# Patient Record
Sex: Male | Born: 1970 | Race: White | Hispanic: No | Marital: Single | Smoking: Former smoker
Health system: Southern US, Community
[De-identification: ages and names within clinical notes are randomized; demographics above are authoritative.]

## PROBLEM LIST (undated history)

## (undated) DIAGNOSIS — E785 Hyperlipidemia, unspecified: Secondary | ICD-10-CM

## (undated) DIAGNOSIS — J45909 Unspecified asthma, uncomplicated: Secondary | ICD-10-CM

## (undated) DIAGNOSIS — E782 Mixed hyperlipidemia: Secondary | ICD-10-CM

## (undated) DIAGNOSIS — G4730 Sleep apnea, unspecified: Secondary | ICD-10-CM

## (undated) DIAGNOSIS — K219 Gastro-esophageal reflux disease without esophagitis: Secondary | ICD-10-CM

## (undated) DIAGNOSIS — E119 Type 2 diabetes mellitus without complications: Secondary | ICD-10-CM

## (undated) DIAGNOSIS — I10 Essential (primary) hypertension: Secondary | ICD-10-CM

## (undated) DIAGNOSIS — G629 Polyneuropathy, unspecified: Secondary | ICD-10-CM

## (undated) DIAGNOSIS — M1990 Unspecified osteoarthritis, unspecified site: Secondary | ICD-10-CM

## (undated) DIAGNOSIS — R7303 Prediabetes: Secondary | ICD-10-CM

## (undated) DIAGNOSIS — Z8719 Personal history of other diseases of the digestive system: Secondary | ICD-10-CM

## (undated) HISTORY — DX: Mixed hyperlipidemia: E78.2

## (undated) HISTORY — DX: Prediabetes: R73.03

## (undated) HISTORY — PX: CATARACT EXTRACTION: SUR2

---

## 2000-05-15 ENCOUNTER — Encounter: Payer: Self-pay | Admitting: Emergency Medicine

## 2000-05-15 ENCOUNTER — Emergency Department (HOSPITAL_COMMUNITY): Admission: EM | Admit: 2000-05-15 | Discharge: 2000-05-15 | Payer: Self-pay | Admitting: Emergency Medicine

## 2011-03-30 ENCOUNTER — Emergency Department (HOSPITAL_COMMUNITY)
Admission: EM | Admit: 2011-03-30 | Discharge: 2011-03-30 | Disposition: A | Discharge status: Home / Self Care | Payer: Self-pay | Attending: Emergency Medicine | Admitting: Emergency Medicine

## 2011-03-30 ENCOUNTER — Encounter (HOSPITAL_COMMUNITY): Payer: Self-pay | Admitting: *Deleted

## 2011-03-30 ENCOUNTER — Other Ambulatory Visit: Payer: Self-pay

## 2011-03-30 DIAGNOSIS — R5381 Other malaise: Secondary | ICD-10-CM | POA: Insufficient documentation

## 2011-03-30 DIAGNOSIS — R112 Nausea with vomiting, unspecified: Secondary | ICD-10-CM | POA: Insufficient documentation

## 2011-03-30 DIAGNOSIS — IMO0001 Reserved for inherently not codable concepts without codable children: Secondary | ICD-10-CM | POA: Insufficient documentation

## 2011-03-30 DIAGNOSIS — E785 Hyperlipidemia, unspecified: Secondary | ICD-10-CM | POA: Insufficient documentation

## 2011-03-30 DIAGNOSIS — R61 Generalized hyperhidrosis: Secondary | ICD-10-CM | POA: Insufficient documentation

## 2011-03-30 DIAGNOSIS — I10 Essential (primary) hypertension: Secondary | ICD-10-CM | POA: Insufficient documentation

## 2011-03-30 DIAGNOSIS — R197 Diarrhea, unspecified: Secondary | ICD-10-CM | POA: Insufficient documentation

## 2011-03-30 DIAGNOSIS — R42 Dizziness and giddiness: Secondary | ICD-10-CM | POA: Insufficient documentation

## 2011-03-30 HISTORY — DX: Essential (primary) hypertension: I10

## 2011-03-30 HISTORY — DX: Hyperlipidemia, unspecified: E78.5

## 2011-03-30 LAB — URINALYSIS, ROUTINE W REFLEX MICROSCOPIC
Bilirubin Urine: NEGATIVE
Hgb urine dipstick: NEGATIVE
Nitrite: NEGATIVE
Urobilinogen, UA: 0.2 mg/dL (ref 0.0–1.0)

## 2011-03-30 LAB — BASIC METABOLIC PANEL
BUN: 22 mg/dL (ref 6–23)
CO2: 27 mEq/L (ref 19–32)
Calcium: 9.8 mg/dL (ref 8.4–10.5)
Chloride: 98 mEq/L (ref 96–112)
Creatinine, Ser: 1.01 mg/dL (ref 0.50–1.35)
GFR calc Af Amer: >90 mL/min (ref >90)
GFR calc non Af Amer: >90 mL/min (ref >90)
Glucose, Bld: 160 mg/dL — ABNORMAL HIGH (ref 70–99)
Potassium: 3.5 mEq/L (ref 3.5–5.1)
Sodium: 135 mEq/L (ref 135–145)

## 2011-03-30 LAB — DIFFERENTIAL
Basophils Absolute: 0 10*3/uL (ref 0.0–0.1)
Basophils Relative: 0 % (ref 0–1)
Eosinophils Absolute: 0.2 10*3/uL (ref 0.0–0.7)
Monocytes Absolute: 0.5 10*3/uL (ref 0.1–1.0)
Monocytes Relative: 6 % (ref 3–12)

## 2011-03-30 LAB — CBC
HCT: 48 % (ref 39.0–52.0)
Hemoglobin: 16.1 g/dL (ref 13.0–17.0)
MCH: 28.6 pg (ref 26.0–34.0)
MCHC: 33.5 g/dL (ref 30.0–36.0)
MCV: 85.4 fL (ref 78.0–100.0)
Platelets: 220 10*3/uL (ref 150–400)
RBC: 5.62 MIL/uL (ref 4.22–5.81)
RDW: 14 % (ref 11.5–15.5)
WBC: 8.7 10*3/uL (ref 4.0–10.5)

## 2011-03-30 MED ORDER — KETOROLAC TROMETHAMINE 30 MG/ML IJ SOLN
30.0000 mg | Freq: Once | INTRAMUSCULAR | Status: AC
  Administered 2011-03-30: 30 mg via INTRAVENOUS
  Filled 2011-03-30: qty 1

## 2011-03-30 MED ORDER — ONDANSETRON HCL 4 MG/2ML IJ SOLN
4.0000 mg | Freq: Once | INTRAMUSCULAR | Status: AC
  Administered 2011-03-30: 4 mg via INTRAVENOUS
  Filled 2011-03-30: qty 2

## 2011-03-30 MED ORDER — SODIUM CHLORIDE 0.9 % IV BOLUS (SEPSIS)
1000.0000 mL | Freq: Once | INTRAVENOUS | Status: AC
  Administered 2011-03-30: 1000 mL via INTRAVENOUS

## 2011-03-30 MED ORDER — PROMETHAZINE HCL 25 MG PO TABS: 25.0000 mg | ORAL_TABLET | Freq: Four times a day (QID) | ORAL | Status: AC | PRN

## 2011-03-30 NOTE — ED Notes (Signed)
Pt states he woke this am with dizziness sweating and nausea. Pt denies abd pain and states no more sob than normal.

## 2011-03-30 NOTE — ED Provider Notes (Signed)
History     CSN: [id number]  Arrival date & time 03/30/11  1016   First MD Initiated Contact with Patient 03/30/11 1049      Chief Complaint  Patient presents with  . Emesis  . Dizziness  . Excessive Sweating    (Consider location/radiation/quality/duration/timing/severity/associated sxs/prior treatment) HPI Comments: Patient c/o sudden onset of nausea, vomiting, generalized weakness this morning.  Reports 4-5 episodes of vomiting stomach contents and one episode of loose brown stool.  Also reports having dizziness and sweating after the vomiting began. No hematemesis, melena or hematochezia.  He also denies, fever, chest pain, dyspnea, headache, abdominal pain or focal weakness  Patient is a 41 y.o. male presenting with vomiting. The history is provided by the patient. No language interpreter was used.  Emesis  This is a new problem. The current episode started 3 to 5 hours ago. The problem occurs 2 to 4 times per day. The problem has been gradually improving. The emesis has an appearance of stomach contents. There has been no fever. Associated symptoms include diarrhea, myalgias and sweats. Pertinent negatives include no abdominal pain, no arthralgias, no chills, no cough, no fever, no headaches and no URI.    Past Medical History  Diagnosis Date  . Hypertension   . Hyperlipidemia     Past Surgical History  Procedure Date  . Cataract extraction     History reviewed. No pertinent family history.  History  Substance Use Topics  . Smoking status: Former Games developer  . Smokeless tobacco: Not on file  . Alcohol Use: No      Review of Systems  Constitutional: Negative for fever and chills.  Eyes: Negative for visual disturbance.  Respiratory: Negative for cough.   Cardiovascular: Negative for chest pain, palpitations and leg swelling.  Gastrointestinal: Positive for nausea, vomiting and diarrhea. Negative for abdominal pain and anal bleeding.  Genitourinary: Negative for  dysuria, hematuria and flank pain.  Musculoskeletal: Positive for myalgias. Negative for arthralgias.  Skin: Negative.   Neurological: Positive for dizziness and weakness. Negative for speech difficulty, numbness and headaches.  Hematological: Negative for adenopathy.  Psychiatric/Behavioral: Negative for confusion and decreased concentration.    Allergies  Review of patient's allergies indicates no known allergies.  Home Medications  No current outpatient prescriptions on file.  BP 170/95  Pulse 83  Temp(Src) 98 F (36.7 C) (Oral)  Resp 18  Ht 5\' 5"  (1.651 m)  Wt 320 lb (145.151 kg)  BMI 53.25 kg/m2  SpO2 96%  Physical Exam  Nursing note and vitals reviewed. Constitutional: He is oriented to person, place, and time. He appears well-developed and well-nourished. No distress.  HENT:  Head: Normocephalic and atraumatic.  Mouth/Throat: Oropharynx is clear and moist.  Neck: Normal range of motion. Neck supple.  Cardiovascular: Normal rate, regular rhythm, normal heart sounds and intact distal pulses.   No murmur heard. Pulmonary/Chest: Effort normal and breath sounds normal. No respiratory distress. He has no wheezes. He has no rales.  Abdominal: Soft. Bowel sounds are normal. He exhibits no distension and no mass. There is no tenderness. There is no rebound and no guarding.  Musculoskeletal: Normal range of motion. He exhibits no edema and no tenderness.  Lymphadenopathy:    He has no cervical adenopathy.  Neurological: He is alert and oriented to person, place, and time. No cranial nerve deficit. He exhibits normal muscle tone. Coordination normal.  Skin: Skin is warm and dry.  Psychiatric: He has a normal mood and affect.  ED Course  Procedures (including critical care time)   Results for orders placed during the hospital encounter of 03/30/11  CBC      Component Value Range   WBC 8.7  4.0 - 10.5 (K/uL)   RBC 5.62  4.22 - 5.81 (MIL/uL)   Hemoglobin 16.1  13.0 -  17.0 (g/dL)   HCT 16.1  09.6 - 04.5 (%)   MCV 85.4  78.0 - 100.0 (fL)   MCH 28.6  26.0 - 34.0 (pg)   MCHC 33.5  30.0 - 36.0 (g/dL)   RDW 40.9  81.1 - 91.4 (%)   Platelets 220  150 - 400 (K/uL)  BASIC METABOLIC PANEL      Component Value Range   Sodium 135  135 - 145 (mEq/L)   Potassium 3.5  3.5 - 5.1 (mEq/L)   Chloride 98  96 - 112 (mEq/L)   CO2 27  19 - 32 (mEq/L)   Glucose, Bld 160 (*) 70 - 99 (mg/dL)   BUN 22  6 - 23 (mg/dL)   Creatinine, Ser 7.82  0.50 - 1.35 (mg/dL)   Calcium 9.8  8.4 - 95.6 (mg/dL)   GFR calc non Af Amer >90  >90 (mL/min)   GFR calc Af Amer >90  >90 (mL/min)  URINALYSIS, ROUTINE W REFLEX MICROSCOPIC      Component Value Range   Color, Urine YELLOW  YELLOW    APPearance CLEAR  CLEAR    Specific Gravity, Urine >1.030 (*) 1.005 - 1.030    pH 6.0  5.0 - 8.0    Glucose, UA NEGATIVE  NEGATIVE (mg/dL)   Hgb urine dipstick NEGATIVE  NEGATIVE    Bilirubin Urine NEGATIVE  NEGATIVE    Ketones, ur NEGATIVE  NEGATIVE (mg/dL)   Protein, ur TRACE (*) NEGATIVE (mg/dL)   Urobilinogen, UA 0.2  0.0 - 1.0 (mg/dL)   Nitrite NEGATIVE  NEGATIVE    Leukocytes, UA NEGATIVE  NEGATIVE   DIFFERENTIAL      Component Value Range   Neutrophils Relative 77  43 - 77 (%)   Neutro Abs 6.7  1.7 - 7.7 (K/uL)   Lymphocytes Relative 14  12 - 46 (%)   Lymphs Abs 1.2  0.7 - 4.0 (K/uL)   Monocytes Relative 6  3 - 12 (%)   Monocytes Absolute 0.5  0.1 - 1.0 (K/uL)   Eosinophils Relative 2  0 - 5 (%)   Eosinophils Absolute 0.2  0.0 - 0.7 (K/uL)   Basophils Relative 0  0 - 1 (%)   Basophils Absolute 0.0  0.0 - 0.1 (K/uL)  URINE MICROSCOPIC-ADD ON      Component Value Range   WBC, UA 0-2  <3 (WBC/hpf)   RBC / HPF 0-2  <3 (RBC/hpf)        MDM     Date: 03/30/2011  Rate: 81  Rhythm: normal sinus rhythm  QRS Axis: normal  Intervals: normal  ST/T Wave abnormalities: normal  Conduction Disutrbances:none  Narrative Interpretation: normal appearing EKG  Old EKG Reviewed: none  available   EKG reviewed by Dr. Colon Branch   Patient has received IV fluids, Zofran, and Toradol IV. Symptoms have resolved he states he is feeling much better and requesting to go home.  Abdomen remains soft and nontender no guarding or peritoneal signs.  Symptoms are likely related to gastroenteritis. He has no focal neuro deficits on his exam, no meningeal signs, no chest pain, dyspnea, or headache. I doubt cardiac or neurological cause.  I have discussed patient's history results and care plan with the EDP. Patient also seen by EDP prior to discharge. He agrees to close followup with his primary care physician or to return here symptoms  worsen.   Patient / Family / Caregiver understand and agree with initial ED impression and plan with expectations set for ED visit. Pt stable in ED with no significant deterioration in condition. Pt feels improved after observation and/or treatment in ED.    Jerrell Mangel L. [place], [place] 03/30/11 2131

## 2011-03-30 NOTE — ED Notes (Signed)
Patient states he is feeling better

## 2011-03-30 NOTE — Discharge Instructions (Signed)
B.R.A.T. Diet  Your doctor has recommended the B.R.A.T. diet for you or your child until the condition improves. This is often used to help control diarrhea and vomiting symptoms. If you or your child can tolerate clear liquids, you may have:   Bananas.    Rice.    Applesauce.    Toast (and other simple starches such as crackers, potatoes, noodles).   Be sure to avoid dairy products, meats, and fatty foods until symptoms are better. Fruit juices such as apple, grape, and prune juice can make diarrhea worse. Avoid these. Continue this diet for 2 days or as instructed by your caregiver.  Document Released: 12/27/2004 Document Revised: 12/16/2010 Document Reviewed: 06/15/2006  ExitCare Patient Information 2012 [employer].    Clear Liquid Diet  The clear liquid diet consists of foods that are liquid or will become liquid at room temperature. You should be able to see through the liquid and beverages. Examples of foods allowed on a clear liquid diet include fruit juice, broth or bouillon, gelatin, or frozen ice pops.  The purpose of this diet is to provide necessary fluid, electrolytes such as sodium and potassium, and energy to keep the body functioning during times when you are not able to consume a regular diet. A clear liquid diet should not be continued for long periods of time as it is not nutritionally adequate.    REASONS FOR USING A CLEAR LIQUID DIET   In sudden onset (acute) conditions for a patient before or after surgery.    As the first step in oral feeding.    For fluid and electrolyte replacement in diarrheal diseases.    As a diet before certain medical tests are performed.   ADEQUACY  The clear liquid diet is adequate only in ascorbic acid, according to the Recommended Dietary Allowances of the National Research Council.  CHOOSING FOODS  Breads and Starches   Allowed:  None are allowed.    Avoid: All are avoided.   Vegetables   Allowed:  Strained tomato or vegetable juice.     Avoid: Any others.   Fruit   Allowed:  Strained fruit juices and fruit drinks. Include 1 serving of citrus or vitamin C-enriched fruit juice daily.    Avoid: Any others.   Meat and Meat Substitutes   Allowed:  None are allowed.    Avoid: All are avoided.   Milk   Allowed:  None are allowed.    Avoid: All are avoided.   Soups and Combination Foods   Allowed:  Clear bouillon, broth, or strained broth-based soups.    Avoid: Any others.   Desserts and Sweets   Allowed:  Sugar, honey. High protein gelatin. Flavored gelatin, ices, or frozen ice pops that do not contain milk.    Avoid: Any others.   Fats and Oils   Allowed:  None are allowed.    Avoid: All are avoided.   Beverages   Allowed: Cereal beverages, coffee (regular or decaffeinated), tea, or soda at the discretion of your caregiver.    Avoid: Any others.   Condiments   Allowed:  Iodized salt.    Avoid: Any others, including pepper.   Supplements   Allowed:  Liquid nutrition beverages.    Avoid: Any others that contain lactose or fiber.   SAMPLE MEAL PLAN  Breakfast   4 oz (120 mL) strained orange juice.     to 1 cup (125 to 250 mL) gelatin (plain or fortified).    1 cup (  250 mL) beverage (coffee or tea).    Sugar, if desired.   Midmorning Snack    cup (125 mL) gelatin (plain or fortified).   Lunch   1 cup (250 mL) broth or consomm.    4 oz (120 mL) strained grapefruit juice.     cup (125 mL) gelatin (plain or fortified).    1 cup (250 mL) beverage (coffee or tea).    Sugar, if desired.   Midafternoon Snack    cup (125 mL) fruit ice.     cup (125 mL) strained fruit juice.   Dinner   1 cup (250 mL) broth or consomm.     cup (125 mL) cranberry juice.     cup (125 mL) flavored gelatin (plain or fortified).    1 cup (250 mL) beverage (coffee or tea).    Sugar, if desired.   Evening Snack   4 oz (120 mL) strained apple juice (vitamin C-fortified).     cup (125 mL) flavored gelatin (plain or fortified).    Document Released: 12/27/2004 Document Revised: 12/16/2010 Document Reviewed: 03/26/2010  ExitCare Patient Information 2012 [employer].    Nausea and Vomiting  Nausea is a sick feeling that often comes before throwing up (vomiting). Vomiting is a reflex where stomach contents come out of your mouth. Vomiting can cause severe loss of body fluids (dehydration). Children and elderly adults can become dehydrated quickly, especially if they also have diarrhea. Nausea and vomiting are symptoms of a condition or disease. It is important to find the cause of your symptoms.  CAUSES     Direct irritation of the stomach lining. This irritation can result from increased acid production (gastroesophageal reflux disease), infection, food poisoning, taking certain medicines (such as nonsteroidal anti-inflammatory drugs), alcohol use, or tobacco use.    Signals from the brain. These signals could be caused by a headache, heat exposure, an inner ear disturbance, increased pressure in the brain from injury, infection, a tumor, or a concussion, pain, emotional stimulus, or metabolic problems.    An obstruction in the gastrointestinal tract (bowel obstruction).    Illnesses such as diabetes, hepatitis, gallbladder problems, appendicitis, kidney problems, cancer, sepsis, atypical symptoms of a heart attack, or eating disorders.    Medical treatments such as chemotherapy and radiation.    Receiving medicine that makes you sleep (general anesthetic) during surgery.   DIAGNOSIS  Your caregiver may ask for tests to be done if the problems do not improve after a few days. Tests may also be done if symptoms are severe or if the reason for the nausea and vomiting is not clear. Tests may include:   Urine tests.    Blood tests.    Stool tests.    Cultures (to look for evidence of infection).    X-rays or other imaging studies.   Test results can help your caregiver make decisions about treatment or the need for additional tests.   TREATMENT  You need to stay well hydrated. Drink frequently but in small amounts. You may wish to drink water, sports drinks, clear broth, or eat frozen ice pops or gelatin dessert to help stay hydrated. When you eat, eating slowly may help prevent nausea. There are also some antinausea medicines that may help prevent nausea.  HOME CARE INSTRUCTIONS     Take all medicine as directed by your caregiver.    If you do not have an appetite, do not force yourself to eat. However, you must continue to drink fluids.      If you have an appetite, eat a normal diet unless your caregiver tells you differently.    Eat a variety of complex carbohydrates (rice, wheat, potatoes, bread), lean meats, yogurt, fruits, and vegetables.    Avoid high-fat foods because they are more difficult to digest.    Drink enough water and fluids to keep your urine clear or pale yellow.    If you are dehydrated, ask your caregiver for specific rehydration instructions. Signs of dehydration may include:    Severe thirst.    Dry lips and mouth.    Dizziness.    Dark urine.    Decreasing urine frequency and amount.    Confusion.    Rapid breathing or pulse.   SEEK IMMEDIATE MEDICAL CARE IF:     You have blood or brown flecks (like coffee grounds) in your vomit.    You have black or bloody stools.    You have a severe headache or stiff neck.    You are confused.    You have severe abdominal pain.    You have chest pain or trouble breathing.    You do not urinate at least once every 8 hours.    You develop cold or clammy skin.    You continue to vomit for longer than 24 to 48 hours.    You have a fever.   MAKE SURE YOU:     Understand these instructions.    Will watch your condition.    Will get help right away if you are not doing well or get worse.   Document Released: 12/27/2004 Document Revised: 12/16/2010 Document Reviewed: 05/26/2010  [employer] Patient Information 2012 [employer].

## 2011-03-31 NOTE — ED Provider Notes (Signed)
Medical screening examination/treatment/procedure(s) were conducted as a shared visit with non-physician practitioner(s) and myself.  I personally evaluated the patient during the encounter  John Cook. Colon Branch, MD 03/31/11 [phone]

## 2011-03-31 NOTE — Progress Notes (Signed)
Patient who presents with nausea, vomiting, diarrhea and abdominal pain. Given IVF, antiemetic, analgesic with relief.Able to take PO fluids.  Cor: RRR Chest: clear all fields to auscultation.  Abd: soft, non tender, hyperactive bowel sounds. Patient has improved in the ER. Discharge home.

## 2014-10-22 DIAGNOSIS — L089 Local infection of the skin and subcutaneous tissue, unspecified: Secondary | ICD-10-CM

## 2014-10-22 DIAGNOSIS — R7303 Prediabetes: Secondary | ICD-10-CM

## 2014-10-22 DIAGNOSIS — I10 Essential (primary) hypertension: Secondary | ICD-10-CM

## 2014-10-22 DIAGNOSIS — E7800 Pure hypercholesterolemia, unspecified: Secondary | ICD-10-CM

## 2014-10-22 NOTE — Congregational Nurse Program (Unsigned)
Client presentd with complaint of sores on left lower abdomen, easily become irritated and bleed or drain. Problem over the past 11/2 yrs. Intermittently. States he has borderline diabetes, high blood pressure (on medication). Has hx of elevated cholesterol, indigestion, treats with Prilosec, concerned that he may have Sleep Apnea. Last physical over 12 yrs ago. Denied Medicaid, applied for Disability. Hasn't worked in 11 yrs. Appt. Scheduled at [facility]. On October 25,2016 at 11am.

## 2015-01-09 DIAGNOSIS — Z139 Encounter for screening, unspecified: Secondary | ICD-10-CM

## 2015-01-09 NOTE — Congregational Nurse Program (Unsigned)
Congregational Nurse Program Note  Date of Encounter: 11/04/14 Past Medical History: Past Medical History  Diagnosis Date   Hypertension    Hyperlipidemia     Encounter Details:    [facility]. Instructions given to return in 6 months or PRN

## 2015-03-06 ENCOUNTER — Emergency Department (HOSPITAL_COMMUNITY)
Admission: EM | Admit: 2015-03-06 | Discharge: 2015-03-06 | Disposition: A | Discharge status: Home / Self Care | Payer: Self-pay | Attending: Emergency Medicine | Admitting: Emergency Medicine

## 2015-03-06 ENCOUNTER — Encounter (HOSPITAL_COMMUNITY): Payer: Self-pay | Admitting: Emergency Medicine

## 2015-03-06 ENCOUNTER — Emergency Department (HOSPITAL_COMMUNITY): Payer: Self-pay

## 2015-03-06 DIAGNOSIS — J069 Acute upper respiratory infection, unspecified: Secondary | ICD-10-CM

## 2015-03-06 DIAGNOSIS — R51 Headache: Secondary | ICD-10-CM | POA: Insufficient documentation

## 2015-03-06 DIAGNOSIS — I10 Essential (primary) hypertension: Secondary | ICD-10-CM | POA: Insufficient documentation

## 2015-03-06 DIAGNOSIS — Z79899 Other long term (current) drug therapy: Secondary | ICD-10-CM | POA: Insufficient documentation

## 2015-03-06 DIAGNOSIS — E785 Hyperlipidemia, unspecified: Secondary | ICD-10-CM | POA: Insufficient documentation

## 2015-03-06 DIAGNOSIS — Z87891 Personal history of nicotine dependence: Secondary | ICD-10-CM | POA: Insufficient documentation

## 2015-03-06 DIAGNOSIS — J40 Bronchitis, not specified as acute or chronic: Secondary | ICD-10-CM

## 2015-03-06 MED ORDER — ALBUTEROL SULFATE (2.5 MG/3ML) 0.083% IN NEBU
2.5000 mg | INHALATION_SOLUTION | Freq: Once | RESPIRATORY_TRACT | Status: AC
  Administered 2015-03-06: 2.5 mg via RESPIRATORY_TRACT
  Filled 2015-03-06: qty 3

## 2015-03-06 MED ORDER — DEXAMETHASONE 4 MG PO TABS: 4.0000 mg | ORAL_TABLET | Freq: Two times a day (BID) | ORAL | Status: DC

## 2015-03-06 MED ORDER — PROMETHAZINE-CODEINE 6.25-10 MG/5ML PO SYRP: 5.0000 mL | ORAL_SOLUTION | Freq: Four times a day (QID) | ORAL | Status: DC | PRN

## 2015-03-06 MED ORDER — OXYMETAZOLINE HCL 0.05 % NA SOLN
2.0000 | Freq: Once | NASAL | Status: AC
  Administered 2015-03-06: 2 via NASAL
  Filled 2015-03-06: qty 15

## 2015-03-06 MED ORDER — DEXAMETHASONE SODIUM PHOSPHATE 4 MG/ML IJ SOLN
8.0000 mg | Freq: Once | INTRAMUSCULAR | Status: AC
  Administered 2015-03-06: 8 mg via INTRAMUSCULAR
  Filled 2015-03-06: qty 2

## 2015-03-06 NOTE — ED Notes (Signed)
Started with cough on Feb 15 th and treated with mucinex OTC at home.  Cough (Yellowish green).  Lower grade temp and chest congestion.

## 2015-03-06 NOTE — ED Provider Notes (Signed)
CSN: [id number]     Arrival date & time 03/06/15  1231 History   First MD Initiated Contact with Patient 03/06/15 1504     Chief Complaint  Patient presents with  . Cough     (Consider location/radiation/quality/duration/timing/severity/associated sxs/prior Treatment) Patient is a 45 y.o. male presenting with cough. The history is provided by the patient.  Cough Cough characteristics:  Productive Sputum characteristics:  Green Severity:  Moderate Onset quality:  Gradual Duration:  9 days Timing:  Intermittent Progression:  Worsening Chronicity:  New Smoker: no   Context: sick contacts and weather changes   Relieved by:  Nothing Ineffective treatments:  Decongestant and cough suppressants Associated symptoms: chills, fever, headaches and sinus congestion   Associated symptoms: no rash   Risk factors: no recent travel     Past Medical History  Diagnosis Date  . Hypertension   . Hyperlipidemia    Past Surgical History  Procedure Laterality Date  . Cataract extraction     History reviewed. No pertinent family history. Social History  Substance Use Topics  . Smoking status: Former Games developer  . Smokeless tobacco: None  . Alcohol Use: No    Review of Systems  Constitutional: Positive for fever and chills.  Respiratory: Positive for cough.   Skin: Negative for rash.  Neurological: Positive for headaches.  All other systems reviewed and are negative.     Allergies  Lisinopril  Home Medications   Prior to Admission medications   Medication Sig Start Date End Date Taking? Authorizing Provider  aspirin EC 325 MG tablet Take 325 mg by mouth daily.    Historical Provider, MD  atenolol (TENORMIN) 25 MG tablet Take 25 mg by mouth daily.    Historical Provider, MD  gemfibrozil (LOPID) 600 MG tablet Take 600 mg by mouth 2 (two) times daily before a meal.    Historical Provider, MD  hydrochlorothiazide (HYDRODIURIL) 25 MG tablet Take 25 mg by mouth daily.    Historical  Provider, MD  omeprazole (PRILOSEC) 20 MG capsule Take 20 mg by mouth daily.    Historical Provider, MD   BP 150/77 mmHg  Pulse 78  Temp(Src) 98.7 F (37.1 C) (Oral)  Resp 20  Ht  (1.651 m)  Wt 151.501 kg  BMI 55.58 kg/m2  SpO2 97% Physical Exam  Constitutional: He is oriented to person, place, and time. He appears well-developed and well-nourished.  Non-toxic appearance.  HENT:  Head: Normocephalic.  Right Ear: Tympanic membrane and external ear normal.  Left Ear: Tympanic membrane and external ear normal.  Nasal congestion present.  Eyes: EOM and lids are normal. Pupils are equal, round, and reactive to light.  Neck: Normal range of motion. Neck supple. Carotid bruit is not present.  Cardiovascular: Normal rate, regular rhythm, normal heart sounds, intact distal pulses and normal pulses.   Pulmonary/Chest: Breath sounds normal. No respiratory distress.  Course breath sounds present.  Abdominal: Soft. Bowel sounds are normal. There is no tenderness. There is no guarding.  Musculoskeletal: Normal range of motion.  Lymphadenopathy:       Head (right side): No submandibular adenopathy present.       Head (left side): No submandibular adenopathy present.    He has no cervical adenopathy.  Neurological: He is alert and oriented to person, place, and time. He has normal strength. No cranial nerve deficit or sensory deficit.  Skin: Skin is warm and dry.  Psychiatric: He has a normal mood and affect. His speech is normal.  Nursing  note and vitals reviewed.   ED Course  Procedures (including critical care time) Labs Review Labs Reviewed - No data to display  Imaging Review Dg Chest 2 View  03/06/2015  CLINICAL DATA:  Fever with cough and congestion EXAM: CHEST  2 VIEW COMPARISON:  None. FINDINGS: Lungs are clear. Heart size and pulmonary vascularity are normal. No adenopathy. There is degenerative change in the thoracic spine. IMPRESSION: No edema or consolidation.  Electronically Signed   By: Bretta Bang III M.D.   On: 03/06/2015 13:24   I have personally reviewed and evaluated these images and lab results as part of my medical decision-making.   EKG Interpretation None      MDM  Chest xray  Is negative for acute problem. Vital signs reviewed. Exam favors Bronchitis and URI Pt to be treated with promethazine-codeine and decadron. Discussed increasing fluids and hand washing. Pt will use albuterol inhaler.   Final diagnoses:  Bronchitis    *I have reviewed nursing notes, vital signs, and all appropriate lab and imaging results for this patient.[address] PA-C 03/09/15 1145  Bethann Berkshire, MD 03/12/15 [phone]

## 2015-03-06 NOTE — Discharge Instructions (Signed)
Your chest x-ray reveals a bronchitis present, but no other changes appreciated. Please increase fluids. Please continue your albuterol 2 puffs every 4 hours. Use Decadron 2 times daily with food, and use promethazine codeine for cough. Use Afrin spray every 8 hours for 5 days only. Upper Respiratory Infection, Adult Most upper respiratory infections (URIs) are a viral infection of the air passages leading to the lungs. A URI affects the nose, throat, and upper air passages. The most common type of URI is nasopharyngitis and is typically referred to as "the common cold." URIs run their course and usually go away on their own. Most of the time, a URI does not require medical attention, but sometimes a bacterial infection in the upper airways can follow a viral infection. This is called a secondary infection. Sinus and middle ear infections are common types of secondary upper respiratory infections. Bacterial pneumonia can also complicate a URI. A URI can worsen asthma and chronic obstructive pulmonary disease (COPD). Sometimes, these complications can require emergency medical care and may be life threatening.  CAUSES Almost all URIs are caused by viruses. A virus is a type of germ and can spread from one person to another.  RISKS FACTORS You may be at risk for a URI if:   You smoke.   You have chronic heart or lung disease.  You have a weakened defense (immune) system.   You are very young or very old.   You have nasal allergies or asthma.  You work in crowded or poorly ventilated areas.  You work in health care facilities or schools. SIGNS AND SYMPTOMS  Symptoms typically develop 2-3 days after you come in contact with a cold virus. Most viral URIs last 7-10 days. However, viral URIs from the influenza virus (flu virus) can last 14-18 days and are typically more severe. Symptoms may include:   Runny or stuffy (congested) nose.   Sneezing.   Cough.   Sore throat.   Headache.    Fatigue.   Fever.   Loss of appetite.   Pain in your forehead, behind your eyes, and over your cheekbones (sinus pain).  Muscle aches.  DIAGNOSIS  Your health care provider may diagnose a URI by:  Physical exam.  Tests to check that your symptoms are not due to another condition such as:  Strep throat.  Sinusitis.  Pneumonia.  Asthma. TREATMENT  A URI goes away on its own with time. It cannot be cured with medicines, but medicines may be prescribed or recommended to relieve symptoms. Medicines may help:  Reduce your fever.  Reduce your cough.  Relieve nasal congestion. HOME CARE INSTRUCTIONS   Take medicines only as directed by your health care provider.   Gargle warm saltwater or take cough drops to comfort your throat as directed by your health care provider.  Use a warm mist humidifier or inhale steam from a shower to increase air moisture. This may make it easier to breathe.  Drink enough fluid to keep your urine clear or pale yellow.   Eat soups and other clear broths and maintain good nutrition.   Rest as needed.   Return to work when your temperature has returned to normal or as your health care provider advises. You may need to stay home longer to avoid infecting others. You can also use a face mask and careful hand washing to prevent spread of the virus.  Increase the usage of your inhaler if you have asthma.   Do not use any  tobacco products, including cigarettes, chewing tobacco, or electronic cigarettes. If you need help quitting, ask your health care provider. PREVENTION  The best way to protect yourself from getting a cold is to practice good hygiene.   Avoid oral or hand contact with people with cold symptoms.   Wash your hands often if contact occurs.  There is no clear evidence that vitamin C, vitamin E, echinacea, or exercise reduces the chance of developing a cold. However, it is always recommended to get plenty of rest,  exercise, and practice good nutrition.  SEEK MEDICAL CARE IF:   You are getting worse rather than better.   Your symptoms are not controlled by medicine.   You have chills.  You have worsening shortness of breath.  You have brown or red mucus.  You have yellow or brown nasal discharge.  You have pain in your face, especially when you bend forward.  You have a fever.  You have swollen neck glands.  You have pain while swallowing.  You have white areas in the back of your throat. SEEK IMMEDIATE MEDICAL CARE IF:   You have severe or persistent:  Headache.  Ear pain.  Sinus pain.  Chest pain.  You have chronic lung disease and any of the following:  Wheezing.  Prolonged cough.  Coughing up blood.  A change in your usual mucus.  You have a stiff neck.  You have changes in your:  Vision.  Hearing.  Thinking.  Mood. MAKE SURE YOU:   Understand these instructions.  Will watch your condition.  Will get help right away if you are not doing well or get worse.   This information is not intended to replace advice given to you by your health care provider. Make sure you discuss any questions you have with your health care provider.   Document Released: 06/22/2000 Document Revised: 05/13/2014 Document Reviewed: 04/03/2013 Elsevier Interactive Patient Education [employer].

## 2015-03-25 DIAGNOSIS — Z139 Encounter for screening, unspecified: Secondary | ICD-10-CM

## 2015-04-15 DIAGNOSIS — Z139 Encounter for screening, unspecified: Secondary | ICD-10-CM

## 2015-04-27 ENCOUNTER — Emergency Department (HOSPITAL_COMMUNITY)
Admission: EM | Admit: 2015-04-27 | Discharge: 2015-04-27 | Disposition: A | Discharge status: Home / Self Care | Payer: Self-pay | Attending: Emergency Medicine | Admitting: Emergency Medicine

## 2015-04-27 ENCOUNTER — Encounter (HOSPITAL_COMMUNITY): Payer: Self-pay | Admitting: Emergency Medicine

## 2015-04-27 DIAGNOSIS — Z79899 Other long term (current) drug therapy: Secondary | ICD-10-CM | POA: Insufficient documentation

## 2015-04-27 DIAGNOSIS — I10 Essential (primary) hypertension: Secondary | ICD-10-CM | POA: Insufficient documentation

## 2015-04-27 DIAGNOSIS — Z87891 Personal history of nicotine dependence: Secondary | ICD-10-CM | POA: Insufficient documentation

## 2015-04-27 DIAGNOSIS — M545 Low back pain, unspecified: Secondary | ICD-10-CM

## 2015-04-27 DIAGNOSIS — Z7982 Long term (current) use of aspirin: Secondary | ICD-10-CM | POA: Insufficient documentation

## 2015-04-27 DIAGNOSIS — E785 Hyperlipidemia, unspecified: Secondary | ICD-10-CM | POA: Insufficient documentation

## 2015-04-27 MED ORDER — CYCLOBENZAPRINE HCL 10 MG PO TABS: 10.0000 mg | ORAL_TABLET | Freq: Three times a day (TID) | ORAL | Status: DC | PRN

## 2015-04-27 MED ORDER — HYDROCODONE-ACETAMINOPHEN 5-325 MG PO TABS: ORAL_TABLET | ORAL | Status: DC

## 2015-04-27 MED ORDER — IBUPROFEN 800 MG PO TABS: 800.0000 mg | ORAL_TABLET | Freq: Three times a day (TID) | ORAL | Status: DC

## 2015-04-27 NOTE — ED Notes (Signed)
History of back pain.  Today bending over and twisted back, rate pain 6/10.

## 2015-04-27 NOTE — ED Provider Notes (Signed)
CSN: [id number]     Arrival date & time 04/27/15  1138 History  By signing my name below, I, John Cook, attest that this documentation has been prepared under the direction and in the presence of Pauline Ausammy Jdyn Parkerson, PA-C Electronically Signed: Soijett Cook, ED Scribe. 04/27/2015. 1:08 PM.   Chief Complaint  Patient presents with  . Back Pain      Patient is a 45 y.o. male presenting with back pain. The history is provided by the patient. No language interpreter was used.  Back Pain Location:  Lumbar spine Quality:  Aching Radiates to:  Does not radiate Pain severity:  Mild Pain is:  Same all the time Onset quality:  Sudden Duration: this morning. Timing:  Constant Progression:  Partially resolved Chronicity:  New Context: twisting   Context comment:  Twisting while doing laundry Relieved by: 10 mg percocet. Worsened by:  Ambulation Ineffective treatments:  None tried Associated symptoms: no abdominal pain, no dysuria, no fever, no numbness, no perianal numbness and no weakness     HPI Comments: John Cook is a 45 y.o. male with a medical hx of HTN and hyperlipidemia who presents to the Emergency Department complaining of 6/10, dull/aching, back pain onset this morning PTA. Pt notes that he bent over and twisted his back while doing laundry. Pt felt a sudden pain to his back initially that brought him to his knees. Pt back pain is worsened with movement. Pt denies any alleviating factors. He states that he has tried his mother's Rx 10 mg percocet with relief for his symptoms. Pt denies fever, bowel/bladder incontinence,  CP, SOB, dysuria, difficulty urinating, abdominal pain, gait problem, numbness, tingling, weakness, cauda equina symptoms, and any other symptoms.    Past Medical History  Diagnosis Date  . Hypertension   . Hyperlipidemia    Past Surgical History  Procedure Laterality Date  . Cataract extraction     History reviewed. No pertinent family history. Social History   Substance Use Topics  . Smoking status: Former Games developermoker  . Smokeless tobacco: None  . Alcohol Use: No    Review of Systems  Constitutional: Negative for fever.  Respiratory: Negative for shortness of breath.   Gastrointestinal: Negative for vomiting, abdominal pain and constipation.  Genitourinary: Negative for dysuria, hematuria, flank pain, decreased urine volume and difficulty urinating.  Musculoskeletal: Positive for back pain. Negative for joint swelling.  Skin: Negative for rash.  Neurological: Negative for weakness and numbness.  All other systems reviewed and are negative.     Allergies  Lisinopril  Home Medications   Prior to Admission medications   Medication Sig Start Date End Date Taking? Authorizing Provider  aspirin EC 325 MG tablet Take 325 mg by mouth daily.    Historical Provider, MD  atenolol (TENORMIN) 25 MG tablet Take 25 mg by mouth daily.    Historical Provider, MD  dexamethasone (DECADRON) 4 MG tablet Take 1 tablet (4 mg total) by mouth 2 (two) times daily with a meal. 03/06/15   Ivery QualeHobson Bryant, PA-C  gemfibrozil (LOPID) 600 MG tablet Take 600 mg by mouth 2 (two) times daily before a meal.    Historical Provider, MD  hydrochlorothiazide (HYDRODIURIL) 25 MG tablet Take 25 mg by mouth daily.    Historical Provider, MD  omeprazole (PRILOSEC) 20 MG capsule Take 20 mg by mouth daily.    Historical Provider, MD  promethazine-codeine (PHENERGAN WITH CODEINE) 6.25-10 MG/5ML syrup Take 5 mLs by mouth every 6 (six) hours as needed. 03/06/15  Ivery Quale, PA-C   BP 164/78 mmHg  Pulse 84  Temp(Src) 97.9 F (36.6 C) (Oral)  Resp 18  Ht  (1.651 m)  Wt 340 lb (154.223 kg)  BMI 56.58 kg/m2  SpO2 98% Physical Exam  Constitutional: He is oriented to person, place, and time. He appears well-developed and well-nourished. No distress.  HENT:  Head: Normocephalic and atraumatic.  Eyes: EOM are normal.  Neck: Neck supple.  Cardiovascular: Normal rate.    Pulmonary/Chest: Effort normal. No respiratory distress.  Abdominal: He exhibits no distension.  Musculoskeletal: Normal range of motion.  Focal tenderness left lower lumbar paraspinal muscles. No spinal tenderness. 5/5 strength against resistance bilaterally.   Neurological: He is alert and oriented to person, place, and time.  Skin: Skin is warm and dry.  Psychiatric: He has a normal mood and affect. His behavior is normal.  Nursing note and vitals reviewed.   ED Course  Procedures (including critical care time) DIAGNOSTIC STUDIES: Oxygen Saturation is 98% on RA, nl by my interpretation.    COORDINATION OF CARE: 1:08 PM Discussed treatment plan with pt at bedside and pt agreed to plan.    Labs Review Labs Reviewed - No data to display  Imaging Review No results found.    EKG Interpretation None      MDM   Final diagnoses:  Left-sided low back pain without sciatica    Pt is well appearing.  Vitals stable.  Ambulates with a steady gait.  No focal neuro deficits or concerning sx's for emergent neurological or infectious process.    I personally performed the services described in this documentation, which was scribed in my presence. The recorded information has been reviewed and is accurate.   Pauline Aus, PA-C 04/29/15 2041  Eber Hong, MD 04/30/15 [phone]

## 2015-04-27 NOTE — Discharge Instructions (Signed)

## 2015-08-20 ENCOUNTER — Emergency Department (HOSPITAL_COMMUNITY)
Admission: EM | Admit: 2015-08-20 | Discharge: 2015-08-20 | Disposition: A | Discharge status: Home / Self Care | Payer: Self-pay | Attending: Emergency Medicine | Admitting: Emergency Medicine

## 2015-08-20 ENCOUNTER — Encounter (HOSPITAL_COMMUNITY): Payer: Self-pay | Admitting: Emergency Medicine

## 2015-08-20 DIAGNOSIS — L03312 Cellulitis of back [any part except buttock]: Secondary | ICD-10-CM | POA: Insufficient documentation

## 2015-08-20 DIAGNOSIS — Z87891 Personal history of nicotine dependence: Secondary | ICD-10-CM | POA: Insufficient documentation

## 2015-08-20 DIAGNOSIS — I10 Essential (primary) hypertension: Secondary | ICD-10-CM | POA: Insufficient documentation

## 2015-08-20 DIAGNOSIS — Z7982 Long term (current) use of aspirin: Secondary | ICD-10-CM | POA: Insufficient documentation

## 2015-08-20 DIAGNOSIS — Z79899 Other long term (current) drug therapy: Secondary | ICD-10-CM | POA: Insufficient documentation

## 2015-08-20 MED ORDER — CEPHALEXIN 500 MG PO CAPS: 500.0000 mg | ORAL_CAPSULE | Freq: Four times a day (QID) | ORAL | 0 refills | Status: AC

## 2015-08-20 MED ORDER — LIDOCAINE-EPINEPHRINE (PF) 2 %-1:200000 IJ SOLN
20.0000 mL | Freq: Once | INTRAMUSCULAR | Status: AC
  Administered 2015-08-20: 20 mL
  Filled 2015-08-20: qty 20

## 2015-08-20 NOTE — ED Triage Notes (Signed)
Having pain to left neck., rates pain 5/10.  History of neck pain.

## 2015-08-20 NOTE — ED Provider Notes (Signed)
[facility]EMER[facility]ENCY DEPT Provider Note   CSN: [id number] Arrival date & time: 08/20/15  1102  First Provider Contact:  None       History   Chief Complaint Chief Complaint  Patient presents with  . Neck Pain    HPI John Cook is a 45 y.o. male.  HPI   Patient is a 45 year old male with a history of hyperlipidemia and hypertension who presents to the emergency department with a bump on his left shoulder for 8 years but has progressively gotten larger, painful and more red over the past 7 days. Touching makes the pain worse. No associated symptoms. Patient is tried anything for this. No drainage. Patient denies fever, chills, nausea, vomiting, numbness/tingling, weakness, neck pain or stiffness.  Past Medical History:  Diagnosis Date  . Hyperlipidemia   . Hypertension     There are no active problems to display for this patient.   Past Surgical History:  Procedure Laterality Date  . CATARACT EXTRACTION         Home Medications    Prior to Admission medications   Medication Sig Start Date End Date Taking? Authorizing Provider  aspirin EC 325 MG tablet Take 325 mg by mouth daily.   Yes Historical Provider, MD  atenolol (TENORMIN) 25 MG tablet Take 25 mg by mouth daily.   Yes Historical Provider, MD  gemfibrozil (LOPID) 600 MG tablet Take 600 mg by mouth 2 (two) times daily before a meal.   Yes Historical Provider, MD  hydrochlorothiazide (HYDRODIURIL) 25 MG tablet Take 25 mg by mouth daily.   Yes Historical Provider, MD  ibuprofen (ADVIL,MOTRIN) 800 MG tablet Take 1 tablet (800 mg total) by mouth 3 (three) times daily. 04/27/15  Yes Tammy Triplett, PA-C  omeprazole (PRILOSEC) 20 MG capsule Take 20 mg by mouth daily.   Yes Historical Provider, MD  cephALEXin (KEFLEX) 500 MG capsule Take 1 capsule (500 mg total) by mouth 4 (four) times daily. 08/20/15 08/25/15  Jerre Simon, PA  cyclobenzaprine (FLEXERIL) 10 MG tablet Take 1 tablet (10 mg total) by mouth 3 (three) times daily  as needed. Patient not taking: Reported on 08/20/2015 04/27/15   Pauline Aus, PA-C  HYDROcodone-acetaminophen (NORCO/VICODIN) 5-325 MG tablet Take one-two tabs po q 4-6 hrs prn pain Patient not taking: Reported on 08/20/2015 04/27/15   Pauline Aus, PA-C    Family History History reviewed. No pertinent family history.  Social History Social History  Substance Use Topics  . Smoking status: Former Games developer  . Smokeless tobacco: Never Used  . Alcohol use No     Allergies   Lisinopril   Review of Systems Review of Systems  Constitutional: Negative for chills and fever.  Gastrointestinal: Negative for abdominal pain, nausea and vomiting.  Musculoskeletal: Negative for neck pain and neck stiffness.  Skin: Positive for color change and wound.  Neurological: Negative for headaches.     Physical Exam Updated Vital Signs BP 137/76 (BP Location: Right Arm)   Pulse 88   Temp 98 F (36.7 C) (Oral)   Resp 17   Ht 5\' 5"  (1.651 m)   Wt (!) 154.2 kg   SpO2 96%   BMI 56.58 kg/m   Physical Exam  Constitutional: He appears well-developed and well-nourished. No distress.  HENT:  Head: Normocephalic and atraumatic.  Eyes: Conjunctivae are normal.  Pulmonary/Chest: Effort normal. No respiratory distress.  Musculoskeletal: Normal range of motion.  Neurological: He is alert. Coordination normal.  Skin: Skin is warm and dry. He is not  diaphoretic.  Large raised area on left trapezius that is indurated, erythematous, increased warmth, no fluctuance. TTP  Psychiatric: He has a normal mood and affect. His behavior is normal.  Nursing note and vitals reviewed.    ED Treatments / Results  Labs (all labs ordered are listed, but only abnormal results are displayed) Labs Reviewed - No data to display  EKG  EKG Interpretation None       Radiology No results found.  Procedures .Marland Kitchen.Incision and Drainage Date/Time: 08/20/2015 2:21 PM Performed by: Rhona RaiderFOCHT, Payton Prinsen L Authorized by:  Mattie MarlinFOCHT, Pietrina Jagodzinski L   Consent:    Consent obtained:  Verbal   Consent given by:  Patient   Risks discussed:  Bleeding, incomplete drainage and infection Location:    Type:  Cyst   Size:  4cm   Location:  Trunk   Trunk location:  Back Pre-procedure details:    Skin preparation:  Betadine Anesthesia (see MAR for exact dosages):    Anesthesia method:  Local infiltration   Local anesthetic:  Lidocaine 2% WITH epi Procedure type:    Complexity:  Simple Procedure details:    Needle aspiration: yes     Needle size:  18 G   Incision depth:  Submucosal   Drainage characteristics: none.   Packing materials:  None Post-procedure details:    Patient tolerance of procedure:  Tolerated well, no immediate complications   EMERGENCY DEPARTMENT US SOFT TISSUE INTERPRETATION "Study: Limited Ultrasound of the noted body part in comments below"  INDICATIONS: Soft tissue infection Multiple views of the body part are obtained with a multi-frequency linear probe  PERFORMED BY:  Myself and Other (see attached note) Dr. Clayborne DanaMesner  IMAGES ARCHIVED?: Yes  SIDE:Left  BODY PART:Upper back  FINDINGS: Cellulitis present and Other appears to be a cyst  LIMITATIONS: none  INTERPRETATION:  Abcess present and Cellulitis present  COMMENT:  Appears to be an infected cyst    Medications Ordered in ED Medications  lidocaine-EPINEPHrine (XYLOCAINE W/EPI) 2 %-1:200000 (PF) injection 20 mL (20 mLs Infiltration Given by Other 08/20/15 1345)     Initial Impression / Assessment and Plan / ED Course  I have reviewed the triage vital signs and the nursing notes.  Pertinent labs & imaging results that were available during my care of the patient were reviewed by me and considered in my medical decision making (see chart for details).  Clinical Course   Patient with skin abscess amenable to incision and drainage seen via bedside ultrasound preformed by Dr. Clayborne DanaMesner and myself. No aspirate obtained with 18  gauge needle, did not open further. Likely an infected cyst. Will treat as cellulitis with 2 day f/u with PCP or here at the ED. Also discussed surgery f/u after infection resolves for possible cyst removal. Discussed home care. Will d/c to home with Keflex. Afebrile, VSS. Discussed strict return precautions. Pt expressed understanding to the discharge instructions.   Pt case discussed and pt seen by Dr. Clayborne DanaMesner who agrees with the above plan.    Final Clinical Impressions(s) / ED Diagnoses   Final diagnoses:  Cellulitis of back except buttock    New Prescriptions Discharge Medication List as of 08/20/2015  2:29 PM    START taking these medications   Details  cephALEXin (KEFLEX) 500 MG capsule Take 1 capsule (500 mg total) by mouth 4 (four) times daily., Starting Thu 08/20/2015, Until Tue 08/25/2015, Print         Joyce CopaJessica L Holloman AFBFocht, GeorgiaPA 08/21/15 [phone]    Barbara CowerJason  Mesner, MD 08/21/15 1310

## 2015-08-20 NOTE — ED Provider Notes (Signed)
Medical screening examination/treatment/procedure(s) were conducted as a shared visit with non-physician practitioner(s) and myself.  I personally evaluated the patient during the encounter.  Here with likely cyst on upper back that has been there for multiple years, however over the last week has had increasing swelling, pain and redness.  On my exam, has erythema overlying large firm nodule. US as below.  Plan to needle aspirate and if purulent will I&D w/ abx, if serosanguinous will just aspirate with abx.   EMERGENCY DEPARTMENT US SOFT TISSUE INTERPRETATION "Study: Limited Soft Tissue Ultrasound"  INDICATIONS: Soft tissue infection Multiple views of the body part were obtained in real-time with a multi-frequency linear probe PERFORMED BY:  Myself IMAGES ARCHIVED?: Yes SIDE:Left BODY PART:Upper back FINDINGS: Abcess present and Cellulitis present INTERPRETATION:  Abcess present and Cellulitis present   CPT:  Upper back [id number]    Marily MemosJason Ebonye Reade, MD 08/21/15 1309

## 2015-08-20 NOTE — Discharge Instructions (Signed)
Take the Keflex as prescribed and be sure to complete the entire course. If you experience a rash or diarrhea discontinue the antibiotic and return immediately to the emergency department. Follow-up with your primary care provider, here at the ED, or the urgent care to have your lesion on your back reevaluated. Follow up with general surgery within one week to discuss having the cyst removed.  Return to the emergency department if your symptoms worsen; if your lesion gets larger, more red, increased pain, you experience fevers, chills, nausea, vomiting, or any other concerning symptoms.

## 2015-08-24 ENCOUNTER — Other Ambulatory Visit: Payer: Self-pay | Admitting: General Surgery

## 2015-09-02 NOTE — Congregational Nurse Program (Signed)
Congregational Nurse Program Note  Date of Encounter: 03/25/2015  Past Medical History: Past Medical History:  Diagnosis Date   Hyperlipidemia    Hypertension     Encounter Details:     CNP Questionnaire - 09/02/15 1405      Patient Demographics   Is this a new or existing patient? New   Patient is considered a/an Not Applicable   Race Caucasian/White     Patient Assistance   [facility] of [facility]   Patient's financial/insurance status Self-Pay   Uninsured Patient Yes   Interventions Assisted patient in making appt.   [facility] the [facility]   Food insecurities addressed Provided food supplies   Transportation assistance No   Assistance securing medications No   Educational health offerings Not Applicable     Encounter Details   Primary purpose of visit Education/Health Concerns   Was an Emergency Department visit averted? No   Does patient have a medical provider? No   Patient referred to Clinic   Was a mental health screening completed? (GAINS tool) No   Does patient have dental issues? No   Does patient have vision issues? No   Does your patient have an abnormal blood pressure today? No   Since previous encounter, have you referred patient for abnormal blood pressure that resulted in a new diagnosis or medication change? No   Does your patient have an abnormal blood glucose today? No   Since previous encounter, have you referred patient for abnormal blood glucose that resulted in a new diagnosis or medication change? No   Was there a life-saving intervention made? No     Needed an appointment to follow-up on arthritis,pre-diabetes, back and shoulder pain, [facility] March [facility], 17 at 9:00am Hedda Sladeatricia Settle, RN [phone]

## 2015-09-04 NOTE — Congregational Nurse Program (Signed)
Congregational Nurse Program Note  Date of Encounter: 04/15/2015  Past Medical History: Past Medical History:  Diagnosis Date  . Hyperlipidemia   . Hypertension     Encounter Details:     CNP Questionnaire - 09/02/15 1405      Patient Demographics   Is this a new or existing patient? New   Patient is considered a/an Not Applicable   Race Caucasian/White     Patient Assistance   [facility] of [facility]   Patient's financial/insurance status Self-Pay   Uninsured Patient Yes   Interventions Assisted patient in making appt.   Patient referred to apply for the following financial assistance  [facility]   Food insecurities addressed Provided food supplies   Transportation assistance No   Assistance securing medications No   Educational health offerings Not Applicable     Encounter Details   Primary purpose of visit Education/Health Concerns   Was an Emergency Department visit averted? No   Does patient have a medical provider? No   Patient referred to Clinic   Was a mental health screening completed? (GAINS tool) No   Does patient have dental issues? No   Does patient have vision issues? No   Does your patient have an abnormal blood pressure today? No   Since previous encounter, have you referred patient for abnormal blood pressure that resulted in a new diagnosis or medication change? No   Does your patient have an abnormal blood glucose today? No   Since previous encounter, have you referred patient for abnormal blood glucose that resulted in a new diagnosis or medication change? No   Was there a life-saving intervention made? No     [facility] visit at [facility] for URI, was given medications. Complaints of productive cough, wheezing after completing meds. [facility]. Pearletha AlfredJan Jeffory Snelgrove,RN CNP [phone]

## 2015-09-10 NOTE — Congregational Nurse Program (Signed)
Congregational Nurse Program Note  Date of Encounter: 03/25/2015  Past Medical History: Past Medical History:  Diagnosis Date  . Hyperlipidemia   . Hypertension     Encounter Details:     CNP Questionnaire - 09/02/15 1405      Patient Demographics   Is this a new or existing patient? New   Patient is considered a/an Not Applicable   Race Caucasian/White     Patient Assistance   Location of Patient [place]   Patient's financial/insurance status Self-Pay   Uninsured Patient Yes   Interventions Assisted patient in making appt.   Patient referred to apply for the following financial assistance [facility]   Food insecurities addressed Provided food supplies   Transportation assistance No   Assistance securing medications No   Educational health offerings Not Applicable     Encounter Details   Primary purpose of visit Education/Health Concerns   Was an Emergency Department visit averted? No   Does patient have a medical provider? No   Patient referred to Clinic   Was a mental health screening completed? (GAINS tool) No   Does patient have dental issues? No   Does patient have vision issues? No   Does your patient have an abnormal blood pressure today? No   Since previous encounter, have you referred patient for abnormal blood pressure that resulted in a new diagnosis or medication change? No   Does your patient have an abnormal blood glucose today? No   Since previous encounter, have you referred patient for abnormal blood glucose that resulted in a new diagnosis or medication change? No   Was there a life-saving intervention made? No     Client was assited with [facility] to follow up with medications, labs. Appointment scheduled for 03/27/15 9:00 established provider. Pearletha AlfredJan Keari Miu,RN CNP [phone]

## 2015-09-17 ENCOUNTER — Emergency Department (HOSPITAL_COMMUNITY)
Admission: EM | Admit: 2015-09-17 | Discharge: 2015-09-17 | Disposition: A | Discharge status: Home / Self Care | Payer: Self-pay | Attending: Emergency Medicine | Admitting: Emergency Medicine

## 2015-09-17 ENCOUNTER — Encounter (HOSPITAL_COMMUNITY): Payer: Self-pay | Admitting: Emergency Medicine

## 2015-09-17 DIAGNOSIS — L0291 Cutaneous abscess, unspecified: Secondary | ICD-10-CM

## 2015-09-17 DIAGNOSIS — I10 Essential (primary) hypertension: Secondary | ICD-10-CM | POA: Insufficient documentation

## 2015-09-17 DIAGNOSIS — L02212 Cutaneous abscess of back [any part, except buttock]: Secondary | ICD-10-CM | POA: Insufficient documentation

## 2015-09-17 DIAGNOSIS — Z7982 Long term (current) use of aspirin: Secondary | ICD-10-CM | POA: Insufficient documentation

## 2015-09-17 DIAGNOSIS — Z87891 Personal history of nicotine dependence: Secondary | ICD-10-CM | POA: Insufficient documentation

## 2015-09-17 DIAGNOSIS — Z79899 Other long term (current) drug therapy: Secondary | ICD-10-CM | POA: Insufficient documentation

## 2015-09-17 DIAGNOSIS — Z23 Encounter for immunization: Secondary | ICD-10-CM | POA: Insufficient documentation

## 2015-09-17 MED ORDER — TETANUS-DIPHTH-ACELL PERTUSSIS 5-2.5-18.5 LF-MCG/0.5 IM SUSP
0.5000 mL | Freq: Once | INTRAMUSCULAR | Status: AC
  Administered 2015-09-17: 0.5 mL via INTRAMUSCULAR
  Filled 2015-09-17: qty 0.5

## 2015-09-17 MED ORDER — HYDROCODONE-ACETAMINOPHEN 5-325 MG PO TABS: 1.0000 | ORAL_TABLET | ORAL | 0 refills | Status: DC | PRN

## 2015-09-17 MED ORDER — LIDOCAINE-EPINEPHRINE (PF) 1 %-1:200000 IJ SOLN
10.0000 mL | Freq: Once | INTRAMUSCULAR | Status: AC
  Administered 2015-09-17: 30 mL via INTRADERMAL
  Filled 2015-09-17: qty 10

## 2015-09-17 MED ORDER — POVIDONE-IODINE 10 % EX SOLN
CUTANEOUS | Status: AC
  Filled 2015-09-17: qty 118

## 2015-09-17 MED ORDER — LIDOCAINE-EPINEPHRINE (PF) 1 %-1:200000 IJ SOLN
INTRAMUSCULAR | Status: AC
  Administered 2015-09-17: 30 mL via INTRADERMAL
  Filled 2015-09-17: qty 30

## 2015-09-17 NOTE — ED Provider Notes (Addendum)
[facility]-EMERGENCY DEPT Provider Note   CSN: [id number] Arrival date & time: 09/17/15  1439     History   Chief Complaint Chief Complaint  Patient presents with  . Abscess    HPI John Cook is a 45 y.o. male.Patient has a cyst on his upper back which she's had for the past 9 years he reports that it began to drain thick pus of possibly one week ago. He has surgery scheduled for cyst removal 09/24/2015. He called [facility] [place] surgery office today and was told to go to [facility] emergency department to meet with Dr. Carolynne Edouard for surgery. He denies fever. Denies other associated symptoms. Seen here 08/20/2015 noted to have cellulitis present around abscess. Prescribed Keflex.  HPI  Past Medical History:  Diagnosis Date  . Hyperlipidemia   . Hypertension     There are no active problems to display for this patient.   Past Surgical History:  Procedure Laterality Date  . CATARACT EXTRACTION         Home Medications    Prior to Admission medications   Medication Sig Start Date End Date Taking? Authorizing Provider  aspirin EC 325 MG tablet Take 325 mg by mouth daily.    Historical Provider, MD  atenolol (TENORMIN) 25 MG tablet Take 25 mg by mouth daily.    Historical Provider, MD  cyclobenzaprine (FLEXERIL) 10 MG tablet Take 1 tablet (10 mg total) by mouth 3 (three) times daily as needed. Patient not taking: Reported on 08/20/2015 04/27/15   Tammy Triplett, PA-C  gemfibrozil (LOPID) 600 MG tablet Take 600 mg by mouth 2 (two) times daily before a meal.    Historical Provider, MD  hydrochlorothiazide (HYDRODIURIL) 25 MG tablet Take 25 mg by mouth daily.    Historical Provider, MD  HYDROcodone-acetaminophen (NORCO/VICODIN) 5-325 MG tablet Take one-two tabs po q 4-6 hrs prn pain Patient not taking: Reported on 08/20/2015 04/27/15   Tammy Triplett, PA-C  ibuprofen (ADVIL,MOTRIN) 800 MG tablet Take 1 tablet (800 mg total) by mouth 3 (three) times daily. 04/27/15   Tammy Triplett, PA-C    omeprazole (PRILOSEC) 20 MG capsule Take 20 mg by mouth daily.    Historical Provider, MD    Family History History reviewed. No pertinent family history.  Social History Social History  Substance Use Topics  . Smoking status: Former Games developer  . Smokeless tobacco: Never Used  . Alcohol use No     Allergies   Lisinopril   Review of Systems Review of Systems  Constitutional: Negative.   HENT: Negative.   Respiratory: Negative.   Cardiovascular: Negative.        Syncope  Gastrointestinal: Negative.   Musculoskeletal: Negative.   Skin: Positive for wound.       Cyst on upper back/lower neck to left of midline overlying trapezius muscle  Allergic/Immunologic: Negative.   Neurological: Negative.   Psychiatric/Behavioral: Negative.   All other systems reviewed and are negative.    Physical Exam Updated Vital Signs BP 159/84 (BP Location: Left Arm)   Pulse 83   Temp 98.4 F (36.9 C) (Oral)   Resp 20   Ht 5\' 5"  (1.651 m)   Wt (!) 340 lb (154.2 kg)   SpO2 97%   BMI 56.58 kg/m   Physical Exam  Constitutional: He appears well-developed and well-nourished. No distress.  HENT:  Head: Normocephalic and atraumatic.  Eyes: Conjunctivae are normal. Pupils are equal, round, and reactive to light.  Neck: Neck supple. No tracheal deviation present. No  thyromegaly present.  Cardiovascular: Normal rate and regular rhythm.   No murmur heard. Pulmonary/Chest: Effort normal and breath sounds normal.  Abdominal: Soft. Bowel sounds are normal. He exhibits no distension. There is no tenderness.  Morbidly obese  Musculoskeletal: Normal range of motion. He exhibits no edema or tenderness.  Neurological: He is alert. Coordination normal.  Skin: Skin is warm and dry. No rash noted.  Baseball sized area overlying left upper back/lower neck, spontaneously draining thick yellow pus spontaneously  Psychiatric: He has a normal mood and affect.  Nursing note and vitals reviewed.   I  discussed case with Dr. Carolynne Edouardoth via telephone. Patient has appointment with him at surgical day center for 09/24/2015. Dr. Carolynne Edouardoth felt it was okay for me to  decompress cyst and abscess  INCISION AND DRAINAGE Performed by: Doug SouJACUBOWITZ,Layanna Charo Consent: Verbal consent obtained. Risks and benefits: risks, benefits and alternatives were discussed Type: abscess  Body area: back  Anesthesia: local infiltration  Incision was made with a scalpel.  Local anesthetic: lidocaine 1% with epinephrine  Anesthetic total: 5 ml  Complexity: complex Blunt dissection to break up loculations  Drainage: purulent  Drainage amount: copious  Packing material: 1/4 in iodoform gauze  Patient tolerance: Patient tolerated the procedure well with no immediate complications.   ED Treatments / Results  Labs (all labs ordered are listed, but only abnormal results are displayed) Labs Reviewed - No data to display  EKG  EKG Interpretation None       Radiology No results found.  Procedures Procedures (including critical care time)  Medications Ordered in ED Medications - No data to display   Initial Impression / Assessment and Plan / ED Course  I have reviewed the triage vital signs and the nursing notes.  Pertinent labs & imaging results that were available during my care of the patient were reviewed by me and considered in my medical decision making (see chart for details).  Clinical Course  Plan prescription Norco. Patient to keep scheduled appointment with Dr. Carolynne Edouardoth on 09/24/2015 at surgical day center  Final Clinical Impressions(s) / ED Diagnoses  Diagnosis: Cutaneous abscess Final diagnoses:  None    New Prescriptions New Prescriptions   No medications on file     Doug SouSam Jishnu Jenniges, MD 09/17/15 1731    Doug SouSam Eliasar Hlavaty, MD 09/17/15 1737

## 2015-09-17 NOTE — ED Notes (Signed)
[facility] Shela CommonsJ.

## 2015-09-17 NOTE — ED Notes (Signed)
Pt made aware to return if symptoms worsen or if any life threatening symptoms occur.   

## 2015-09-17 NOTE — Discharge Instructions (Signed)
Take Tylenol for mild pain or the pain medicine prescribed for bad pain. Don't take Tylenol together with the pain medicine prescribed as the combination can be dangerous to your liver. Keep your scheduled appointment with Dr.Toth for 09/24/2015.

## 2015-09-17 NOTE — ED Triage Notes (Signed)
PT states he has surgery scheduled for 09/24/15 for abscess to neck and the area started draining x2 days ago. PT states he called the surgeon's office today and [facility]

## 2015-09-23 ENCOUNTER — Encounter (HOSPITAL_BASED_OUTPATIENT_CLINIC_OR_DEPARTMENT_OTHER): Payer: Self-pay | Admitting: *Deleted

## 2015-09-23 MED ORDER — DEXTROSE 5 % IV SOLN
3.0000 g | INTRAVENOUS | Status: AC
  Administered 2015-09-24: 3 g via INTRAVENOUS
  Filled 2015-09-23: qty 3000

## 2015-09-23 NOTE — Progress Notes (Signed)
   09/23/15 1430  OBSTRUCTIVE SLEEP APNEA  Have you ever been diagnosed with sleep apnea through a sleep study? No  Do you snore loudly (loud enough to be heard through closed doors)?  1  Do you often feel tired, fatigued, or sleepy during the daytime (such as falling asleep during driving or talking to someone)? 1  Has anyone observed you stop breathing during your sleep? 1  Do you have, or are you being treated for high blood pressure? 1  BMI more than 35 kg/m2? 1  Age > 50 (1-yes) 0  Male Gender (Yes=1) 1  Obstructive Sleep Apnea Score 6  Score 5 or greater  Results sent to PCP

## 2015-09-24 ENCOUNTER — Encounter (HOSPITAL_COMMUNITY): Payer: Self-pay | Admitting: *Deleted

## 2015-09-24 ENCOUNTER — Ambulatory Visit (HOSPITAL_COMMUNITY): Payer: Self-pay | Admitting: Anesthesiology

## 2015-09-24 ENCOUNTER — Encounter (HOSPITAL_COMMUNITY)
Admission: RE | Disposition: A | Discharge status: Home / Self Care | Payer: Self-pay | Source: Ambulatory Visit | Attending: General Surgery

## 2015-09-24 ENCOUNTER — Ambulatory Visit (HOSPITAL_BASED_OUTPATIENT_CLINIC_OR_DEPARTMENT_OTHER)
Admission: RE | Admit: 2015-09-24 | Discharge: 2015-09-24 | Disposition: A | Discharge status: Home / Self Care | Payer: Self-pay | Source: Ambulatory Visit | Attending: General Surgery | Admitting: General Surgery

## 2015-09-24 DIAGNOSIS — Z87891 Personal history of nicotine dependence: Secondary | ICD-10-CM | POA: Insufficient documentation

## 2015-09-24 DIAGNOSIS — I10 Essential (primary) hypertension: Secondary | ICD-10-CM | POA: Insufficient documentation

## 2015-09-24 DIAGNOSIS — L0211 Cutaneous abscess of neck: Secondary | ICD-10-CM | POA: Insufficient documentation

## 2015-09-24 DIAGNOSIS — Z79899 Other long term (current) drug therapy: Secondary | ICD-10-CM | POA: Insufficient documentation

## 2015-09-24 DIAGNOSIS — Z7982 Long term (current) use of aspirin: Secondary | ICD-10-CM | POA: Insufficient documentation

## 2015-09-24 DIAGNOSIS — J45909 Unspecified asthma, uncomplicated: Secondary | ICD-10-CM | POA: Insufficient documentation

## 2015-09-24 DIAGNOSIS — E7800 Pure hypercholesterolemia, unspecified: Secondary | ICD-10-CM | POA: Insufficient documentation

## 2015-09-24 DIAGNOSIS — Z791 Long term (current) use of non-steroidal anti-inflammatories (NSAID): Secondary | ICD-10-CM | POA: Insufficient documentation

## 2015-09-24 DIAGNOSIS — K219 Gastro-esophageal reflux disease without esophagitis: Secondary | ICD-10-CM | POA: Insufficient documentation

## 2015-09-24 DIAGNOSIS — G629 Polyneuropathy, unspecified: Secondary | ICD-10-CM | POA: Insufficient documentation

## 2015-09-24 HISTORY — DX: Gastro-esophageal reflux disease without esophagitis: K21.9

## 2015-09-24 HISTORY — DX: Unspecified osteoarthritis, unspecified site: M19.90

## 2015-09-24 HISTORY — PX: INCISION AND DRAINAGE ABSCESS: SHX5864

## 2015-09-24 HISTORY — DX: Polyneuropathy, unspecified: G62.9

## 2015-09-24 HISTORY — DX: Personal history of other diseases of the digestive system: Z87.19

## 2015-09-24 HISTORY — DX: Unspecified asthma, uncomplicated: J45.909

## 2015-09-24 LAB — CBC
HCT: 45.6 % (ref 39.0–52.0)
Hemoglobin: 14.8 g/dL (ref 13.0–17.0)
MCH: 28.7 pg (ref 26.0–34.0)
MCHC: 32.5 g/dL (ref 30.0–36.0)
MCV: 88.4 fL (ref 78.0–100.0)
PLATELETS: 235 10*3/uL (ref 150–400)
RBC: 5.16 MIL/uL (ref 4.22–5.81)
RDW: 14.5 % (ref 11.5–15.5)
WBC: 7.4 10*3/uL (ref 4.0–10.5)

## 2015-09-24 LAB — BASIC METABOLIC PANEL
Anion gap: 7 (ref 5–15)
BUN: 16 mg/dL (ref 6–20)
CO2: 26 mmol/L (ref 22–32)
CREATININE: 0.91 mg/dL (ref 0.61–1.24)
Calcium: 9.2 mg/dL (ref 8.9–10.3)
Chloride: 106 mmol/L (ref 101–111)
GFR calc Af Amer: >60 mL/min (ref >60)
Glucose, Bld: 109 mg/dL — ABNORMAL HIGH (ref 65–99)
Potassium: 4 mmol/L (ref 3.5–5.1)
SODIUM: 139 mmol/L (ref 135–145)

## 2015-09-24 SURGERY — INCISION AND DRAINAGE, ABSCESS
Anesthesia: General | Site: Neck | Laterality: Left

## 2015-09-24 MED ORDER — MIDAZOLAM HCL 5 MG/5ML IJ SOLN
INTRAMUSCULAR | Status: DC | PRN
  Administered 2015-09-24: 2 mg via INTRAVENOUS

## 2015-09-24 MED ORDER — PROPOFOL 10 MG/ML IV BOLUS
INTRAVENOUS | Status: AC
  Filled 2015-09-24: qty 20

## 2015-09-24 MED ORDER — FENTANYL CITRATE (PF) 100 MCG/2ML IJ SOLN
INTRAMUSCULAR | Status: AC
  Filled 2015-09-24: qty 2

## 2015-09-24 MED ORDER — CHLORHEXIDINE GLUCONATE CLOTH 2 % EX PADS: 6.0000 | MEDICATED_PAD | Freq: Once | CUTANEOUS | Status: DC

## 2015-09-24 MED ORDER — FENTANYL CITRATE (PF) 250 MCG/5ML IJ SOLN
INTRAMUSCULAR | Status: DC | PRN
  Administered 2015-09-24: 100 ug via INTRAVENOUS

## 2015-09-24 MED ORDER — SUCCINYLCHOLINE CHLORIDE 20 MG/ML IJ SOLN
INTRAMUSCULAR | Status: DC | PRN
  Administered 2015-09-24: 200 mg via INTRAVENOUS

## 2015-09-24 MED ORDER — ROCURONIUM BROMIDE 100 MG/10ML IV SOLN
INTRAVENOUS | Status: DC | PRN
  Administered 2015-09-24: 40 mg via INTRAVENOUS

## 2015-09-24 MED ORDER — PROPOFOL 10 MG/ML IV BOLUS
INTRAVENOUS | Status: DC | PRN
  Administered 2015-09-24: 50 mg via INTRAVENOUS
  Administered 2015-09-24: 200 mg via INTRAVENOUS

## 2015-09-24 MED ORDER — HYDROCODONE-ACETAMINOPHEN 5-325 MG PO TABS: 1.0000 | ORAL_TABLET | ORAL | 0 refills | Status: DC | PRN

## 2015-09-24 MED ORDER — ONDANSETRON HCL 4 MG/2ML IJ SOLN
INTRAMUSCULAR | Status: DC | PRN
  Administered 2015-09-24: 4 mg via INTRAVENOUS

## 2015-09-24 MED ORDER — FENTANYL CITRATE (PF) 100 MCG/2ML IJ SOLN
INTRAMUSCULAR | Status: AC
  Administered 2015-09-24: 25 ug via INTRAVENOUS
  Filled 2015-09-24: qty 2

## 2015-09-24 MED ORDER — GLYCOPYRROLATE 0.2 MG/ML IJ SOLN
INTRAMUSCULAR | Status: DC | PRN
  Administered 2015-09-24: 0.2 mg via INTRAVENOUS

## 2015-09-24 MED ORDER — SUGAMMADEX SODIUM 500 MG/5ML IV SOLN
INTRAVENOUS | Status: DC | PRN
  Administered 2015-09-24: 350 mg via INTRAVENOUS

## 2015-09-24 MED ORDER — LACTATED RINGERS IV SOLN
INTRAVENOUS | Status: DC
  Administered 2015-09-24 (×2): via INTRAVENOUS

## 2015-09-24 MED ORDER — PROMETHAZINE HCL 25 MG/ML IJ SOLN: 6.2500 mg | INTRAMUSCULAR | Status: DC | PRN

## 2015-09-24 MED ORDER — FENTANYL CITRATE (PF) 100 MCG/2ML IJ SOLN
25.0000 ug | INTRAMUSCULAR | Status: DC | PRN
  Administered 2015-09-24 (×2): 25 ug via INTRAVENOUS

## 2015-09-24 MED ORDER — BUPIVACAINE-EPINEPHRINE 0.25% -1:200000 IJ SOLN
INTRAMUSCULAR | Status: DC | PRN
  Administered 2015-09-24: 10 mL

## 2015-09-24 MED ORDER — LIDOCAINE HCL (CARDIAC) 20 MG/ML IV SOLN
INTRAVENOUS | Status: DC | PRN
  Administered 2015-09-24: 100 mg via INTRATRACHEAL

## 2015-09-24 MED ORDER — MIDAZOLAM HCL 2 MG/2ML IJ SOLN
INTRAMUSCULAR | Status: AC
  Filled 2015-09-24: qty 2

## 2015-09-24 MED ORDER — 0.9 % SODIUM CHLORIDE (POUR BTL) OPTIME
TOPICAL | Status: DC | PRN
  Administered 2015-09-24: 1000 mL

## 2015-09-24 MED ORDER — BUPIVACAINE-EPINEPHRINE (PF) 0.25% -1:200000 IJ SOLN
INTRAMUSCULAR | Status: AC
  Filled 2015-09-24: qty 30

## 2015-09-24 MED ORDER — PHENYLEPHRINE HCL 10 MG/ML IJ SOLN
INTRAMUSCULAR | Status: DC | PRN
  Administered 2015-09-24 (×3): 120 ug via INTRAVENOUS

## 2015-09-24 MED ORDER — ALBUTEROL SULFATE HFA 108 (90 BASE) MCG/ACT IN AERS
INHALATION_SPRAY | RESPIRATORY_TRACT | Status: DC | PRN
  Administered 2015-09-24 (×2): 3 via RESPIRATORY_TRACT

## 2015-09-24 SURGICAL SUPPLY — 27 items
BLADE SURG ROTATE 9660 (MISCELLANEOUS) ×2 IMPLANT
BNDG GAUZE ELAST 4 BULKY (GAUZE/BANDAGES/DRESSINGS) ×2 IMPLANT
CANISTER SUCTION 2500CC (MISCELLANEOUS) ×3 IMPLANT
COVER SURGICAL LIGHT HANDLE (MISCELLANEOUS) ×3 IMPLANT
DRAPE LAPAROSCOPIC ABDOMINAL (DRAPES) ×2 IMPLANT
DRSG PAD ABDOMINAL 8X10 ST (GAUZE/BANDAGES/DRESSINGS) IMPLANT
ELECT CAUTERY BLADE 6.4 (BLADE) ×3 IMPLANT
ELECT REM PT RETURN 9FT ADLT (ELECTROSURGICAL) ×3
ELECTRODE REM PT RTRN 9FT ADLT (ELECTROSURGICAL) ×1 IMPLANT
GAUZE SPONGE 4X4 12PLY STRL (GAUZE/BANDAGES/DRESSINGS) ×2 IMPLANT
GLOVE BIO SURGEON STRL SZ7.5 (GLOVE) ×3 IMPLANT
GLOVE SURG SS PI 6.5 STRL IVOR (GLOVE) ×2 IMPLANT
GOWN STRL REUS W/ TWL LRG LVL3 (GOWN DISPOSABLE) ×2 IMPLANT
GOWN STRL REUS W/TWL LRG LVL3 (GOWN DISPOSABLE) ×9
KIT BASIN OR (CUSTOM PROCEDURE TRAY) ×3 IMPLANT
KIT ROOM TURNOVER OR (KITS) ×3 IMPLANT
NDL HYPO 25GX1X1/2 BEV (NEEDLE) IMPLANT
NEEDLE 25GAX1.5 (MISCELLANEOUS) ×2 IMPLANT
NEEDLE HYPO 25GX1X1/2 BEV (NEEDLE) IMPLANT
NS IRRIG 1000ML POUR BTL (IV SOLUTION) ×3 IMPLANT
PACK GENERAL/GYN (CUSTOM PROCEDURE TRAY) ×3 IMPLANT
PAD ARMBOARD 7.5X6 YLW CONV (MISCELLANEOUS) ×3 IMPLANT
SOL PREP POV-IOD 4OZ 10% (MISCELLANEOUS) ×2 IMPLANT
SWAB COLLECTION DEVICE MRSA (MISCELLANEOUS) IMPLANT
SYR CONTROL 10ML LL (SYRINGE) ×2 IMPLANT
TOWEL OR 17X24 6PK STRL BLUE (TOWEL DISPOSABLE) ×3 IMPLANT
TUBE ANAEROBIC SPECIMEN COL (MISCELLANEOUS) IMPLANT

## 2015-09-24 NOTE — Anesthesia Preprocedure Evaluation (Addendum)
Anesthesia Evaluation  Patient identified by MRN, date of birth, ID band Patient awake    Reviewed: Allergy & Precautions, NPO status , Patient's Chart, lab work & pertinent test results, reviewed documented beta blocker date and time   History of Anesthesia Complications Negative for: history of anesthetic complications  Airway Mallampati: III  TM Distance: >3 FB Neck ROM: Full    Dental  (+) Teeth Intact, Dental Advisory Given   Pulmonary asthma , former smoker,    Pulmonary exam normal breath sounds clear to auscultation       Cardiovascular hypertension, Pt. on medications and Pt. on home beta blockers Normal cardiovascular exam Rhythm:Regular Rate:Normal     Neuro/Psych Bilateral lower extremity neuropathy     GI/Hepatic Neg liver ROS, hiatal hernia, GERD  Medicated,  Endo/Other  negative endocrine ROS  Renal/GU negative Renal ROS     Musculoskeletal  (+) Arthritis ,   Abdominal   Peds  Hematology negative hematology ROS (+)   Anesthesia Other Findings Day of surgery medications reviewed with the patient.  Reproductive/Obstetrics                             Anesthesia Physical Anesthesia Plan  ASA: II  Anesthesia Plan: General   Post-op Pain Management:    Induction: Intravenous  Airway Management Planned: Oral ETT and Video Laryngoscope Planned  Additional Equipment:   Intra-op Plan:   Post-operative Plan: Extubation in OR  Informed Consent: I have reviewed the patients History and Physical, chart, labs and discussed the procedure including the risks, benefits and alternatives for the proposed anesthesia with the patient or authorized representative who has indicated his/her understanding and acceptance.   Dental advisory given  Plan Discussed with: CRNA  Anesthesia Plan Comments: (Risks/benefits of general anesthesia discussed with patient including risk of damage  to teeth, lips, gum, and tongue, nausea/vomiting, allergic reactions to medications, and the possibility of heart attack, stroke and death.  All patient questions answered.  Patient wishes to proceed.)        Anesthesia Quick Evaluation

## 2015-09-24 NOTE — Op Note (Signed)
09/24/2015  1:16 PM  PATIENT:  John Cook  45 y.o. male  PRE-OPERATIVE DIAGNOSIS:  left neck abscess  POST-OPERATIVE DIAGNOSIS:  left neck abscess  PROCEDURE:  Procedure(s): INCISION AND DRAINAGE OF LEFT NECK ABSCESS (Left)  SURGEON:  Surgeon(s) and Role:    * Griselda MinerPaul Toth III, MD - Primary  PHYSICIAN ASSISTANT:   ASSISTANTS: none   ANESTHESIA:   local and general  EBL:  Total I/O In: -  Out: 10 [Blood:10]  BLOOD ADMINISTERED:none  DRAINS: none   LOCAL MEDICATIONS USED:  MARCAINE     SPECIMEN:  Source of Specimen:  wall of left neck abscess cavity  DISPOSITION OF SPECIMEN:  PATHOLOGY  COUNTS:  YES  TOURNIQUET:  * No tourniquets in log *  DICTATION: .Dragon Dictation   After informed consent was obtained the patient was brought to the operating room and placed in the supine position on the stretcher. After adequate induction of general anesthesia the patient was moved onto the operating room table on a beanbag with the left side up in a lateral position. All pressure points were padded. The posterior neck area was then prepped with Betadine and draped in usual sterile manner. An appropriate timeout was performed. There was an abscess cavity on the left posterior neck that spontaneously drained a couple days ago. The cavity was probed with a hemostat. The incision was extended medially and laterally with the electrocautery. The skin and walls of the abscess cavity were then excised sharply with the electrocautery. Once the hardened fibrotic walls of the abscess cavity were removed and the tissue appeared healthier then hemostasis was achieved using the Bovie electrocautery. The walls of the cavity were sent to pathology. The wound was then packed with moistened Kerlix gauze.  Sterile dressings were applied. The patient tolerated the procedure well. At the end of the case all needle sponge and instrument counts were correct. The patient was then awakened and taken to recovery in  stable condition.  PLAN OF CARE: Discharge to home after PACU  PATIENT DISPOSITION:  PACU - hemodynamically stable.   Delay start of Pharmacological VTE agent (>24hrs) due to surgical blood loss or risk of bleeding: not applicable

## 2015-09-24 NOTE — Anesthesia Postprocedure Evaluation (Signed)
Anesthesia Post Note  Patient: John Cook  Procedure(s) Performed: Procedure(s) (LRB): INCISION AND DRAINAGE OF LEFT NECK ABSCESS (Left)  Patient location during evaluation: PACU Anesthesia Type: General Level of consciousness: awake and alert, oriented and patient cooperative Pain management: pain level controlled Vital Signs Assessment: post-procedure vital signs reviewed and stable Respiratory status: spontaneous breathing, nonlabored ventilation, respiratory function stable and patient connected to nasal cannula oxygen Cardiovascular status: blood pressure returned to baseline and stable Postop Assessment: no signs of nausea or vomiting Anesthetic complications: no    Last Vitals:  Vitals:   09/24/15 1415 09/24/15 1432  BP: 109/70 111/67  Pulse: 84 78  Resp: 13   Temp:      Last Pain:  Vitals:   09/24/15 1432  TempSrc:   PainSc: 0-No pain                 Duval Macleod,E. Neeko Pharo

## 2015-09-24 NOTE — Transfer of Care (Signed)
Immediate Anesthesia Transfer of Care Note  Patient: John Cook  Procedure(s) Performed: Procedure(s): INCISION AND DRAINAGE OF LEFT NECK ABSCESS (Left)  Patient Location: PACU  Anesthesia Type:General  Level of Consciousness: awake, alert  and oriented  Airway & Oxygen Therapy: Patient Spontanous Breathing and Patient connected to nasal cannula oxygen  Post-op Assessment: Report given to RN, Post -op Vital signs reviewed and stable and Patient moving all extremities X 4  Post vital signs: Reviewed and stable  Last Vitals:  Vitals:   09/24/15 0942  BP: (!) 147/72  Pulse: 69  Resp: 20  Temp: 36.7 C    Last Pain:  Vitals:   09/24/15 0942  TempSrc: Oral      Patients Stated Pain Goal: 5 (09/24/15 0950)  Complications: No apparent anesthesia complications

## 2015-09-24 NOTE — Interval H&P Note (Signed)
History and Physical Interval Note:  09/24/2015 12:06 PM  John Cook  has presented today for surgery, with the diagnosis of left neck abscess  The various methods of treatment have been discussed with the patient and family. After consideration of risks, benefits and other options for treatment, the patient has consented to  Procedure(s): INCISION AND DRAINAGE OF LEFT NECK ABSCESS (Left) as a surgical intervention .  The patient's history has been reviewed, patient examined, no change in status, stable for surgery.  I have reviewed the patient's chart and labs.  Questions were answered to the patient's satisfaction.     TOTH III,Dymond Gutt S

## 2015-09-24 NOTE — H&P (Signed)
John Cook  Location: [facility] Surgery Patient #: [id number] DOB: 27-Jul-1970 Single / Language: [place] / Race: White Male   History of Present Illness  The patient is a 45 year old male who presents with a subcutaneous abscess. We are asked to see the patient in consultation by Dr. Clayborne Dana to evaluate him for an abscess on the neck. The patient is a 45 year old white male who developed a painful swollen area on his neck a couple weeks ago. He went to the emergency department where he was placed on Keflex. He has had some improvement of the redness and discomfort. He also notes a small area of drainage on his lower abdominal wall. He denies any fevers or chills.   Other Problems Back Pain High blood pressure Hypercholesterolemia  Past Surgical History No pertinent past surgical history  Diagnostic Studies History  Colonoscopy never  Allergies  Lisinopril-HCTZ *ANTIHYPERTENSIVES*  Medication History  Aspirin (81MG  Tablet, Oral) Active. Atenolol (25MG  Tablet, Oral) Active. Lopid (600MG  Tablet, Oral two times daily) Active. HydroCHLOROthiazide (25MG  Tablet, Oral) Active. Keflex (500MG  Capsule, Oral) Active. Hydrocodone-Acetaminophen (5-500MG  Capsule, Oral) Active. Medications Reconciled  Social History  Caffeine use Coffee. No drug use Tobacco use Former smoker.  Family History Diabetes Mellitus Father, Mother. Hypertension Father, Mother.    Review of Systems General Not Present- Appetite Loss, Chills, Fatigue, Fever, Night Sweats, Weight Gain and Weight Loss. Skin Not Present- Change in Wart/Mole, Dryness, Hives, Jaundice, New Lesions, Non-Healing Wounds, Rash and Ulcer. HEENT Not Present- Earache, Hearing Loss, Hoarseness, Nose Bleed, Oral Ulcers, Ringing in the Ears, Seasonal Allergies, Sinus Pain, Sore Throat, Visual Disturbances, Wears glasses/contact lenses and Yellow Eyes. Respiratory Not Present- Bloody sputum, Chronic Cough,  Difficulty Breathing, Snoring and Wheezing. Breast Not Present- Breast Mass, Breast Pain, Nipple Discharge and Skin Changes. Cardiovascular Not Present- Chest Pain, Difficulty Breathing Lying Down, Leg Cramps, Palpitations, Rapid Heart Rate, Shortness of Breath and Swelling of Extremities. Gastrointestinal Not Present- Abdominal Pain, Bloating, Bloody Stool, Change in Bowel Habits, Chronic diarrhea, Constipation, Difficulty Swallowing, Excessive gas, Gets full quickly at meals, Hemorrhoids, Indigestion, Nausea, Rectal Pain and Vomiting. Male Genitourinary Not Present- Blood in Urine, Change in Urinary Stream, Frequency, Impotence, Nocturia, Painful Urination, Urgency and Urine Leakage. Musculoskeletal Not Present- Back Pain, Joint Pain, Joint Stiffness, Muscle Pain, Muscle Weakness and Swelling of Extremities. Neurological Not Present- Decreased Memory, Fainting, Headaches, Numbness, Seizures, Tingling, Tremor, Trouble walking and Weakness. Psychiatric Not Present- Anxiety, Bipolar, Change in Sleep Pattern, Depression, Fearful and Frequent crying. Endocrine Not Present- Cold Intolerance, Excessive Hunger, Hair Changes, Heat Intolerance and New Diabetes. Hematology Not Present- Easy Bruising, Excessive bleeding, Gland problems, HIV and Persistent Infections.  Vitals  Weight: 342 lb Height: 65in Body Surface Area: 2.48 m Body Mass Index: 56.91 kg/m  Temp.: 98.58F(Temporal)  Pulse: 100 (Regular)  BP: 138/82 (Sitting, Left Arm, Standard)       Physical Exam  General Mental Status-Alert. General Appearance-Consistent with stated age. Hydration-Well hydrated. Voice-Normal.  Head and Neck Head-normocephalic, atraumatic with no lesions or palpable masses. Trachea-midline. Thyroid Gland Characteristics - normal size and consistency. Note: There is a large swollen area at the base of the left posterior neck that measures about 6-7 cm. There is some cellulitis  associated with it which seems to be improving. There are some punctate openings in the skin consistent with a possible infected sebaceous cyst.   Eye Eyeball - Bilateral-Extraocular movements intact. Sclera/Conjunctiva - Bilateral-No scleral icterus.  Chest and Lung Exam Chest and lung exam reveals -quiet,  even and easy respiratory effort with no use of accessory muscles and on auscultation, normal breath sounds, no adventitious sounds and normal vocal resonance. Inspection Chest Wall - Normal. Back - normal.  Cardiovascular Cardiovascular examination reveals -normal heart sounds, regular rate and rhythm with no murmurs and normal pedal pulses bilaterally.  Abdomen Inspection Inspection of the abdomen reveals - No Hernias. Skin - Scar - no surgical scars. Palpation/Percussion Palpation and Percussion of the abdomen reveal - Soft, Non Tender, No Rebound tenderness, No Rigidity (guarding) and No hepatosplenomegaly. Auscultation Auscultation of the abdomen reveals - Bowel sounds normal. Note: There are 2 small areas of chronic induration on the lower left abdominal wall. There is no cellulitis associated with it. There is some small amount of bloody drainage.   Neurologic Neurologic evaluation reveals -alert and oriented x 3 with no impairment of recent or remote memory. Mental Status-Normal.  Musculoskeletal Normal Exam - Left-Upper Extremity Strength Normal and Lower Extremity Strength Normal. Normal Exam - Right-Upper Extremity Strength Normal and Lower Extremity Strength Normal.  Lymphatic Head & Neck  General Head & Neck Lymphatics: Bilateral - Description - Normal. Axillary  General Axillary Region: Bilateral - Description - Normal. Tenderness - Non Tender. Femoral & Inguinal  Generalized Femoral & Inguinal Lymphatics: Bilateral - Description - Normal. Tenderness - Non Tender.    Assessment & Plan  ABSCESS OF SKIN OF NECK (L02.11) Current  Plans Started Keflex 500MG , 1 (one) Capsule four times daily, #28, 08/24/2015, Ref. x2. ABDOMINAL WALL ABSCESS (L02.211) Impression: The patient appears to have a large infected sebaceous cyst of the left posterior neck as well as a smaller abscess of the lower abdominal wall. The abscess has been improving on antibiotics. The abscess appears to be too large to incise and drain here in the clinic. I would recommend this be done in the operating room. I have discussed with him in detail the risks and benefits of incising and draining both areas and possibly closing the lower abdominal wall incision and he understands and wishes to proceed

## 2015-09-24 NOTE — Anesthesia Procedure Notes (Signed)
Procedure Name: Intubation Performed by: Marena ChancyBECKNER, John Cook Pre-anesthesia Checklist: Patient identified, Emergency Drugs available, Suction available and Patient being monitored Patient Re-evaluated:Patient Re-evaluated prior to inductionOxygen Delivery Method: Circle System Utilized Preoxygenation: Pre-oxygenation with 100% oxygen Intubation Type: IV induction Laryngoscope Size: Glidescope Grade View: Grade II Tube type: Oral Tube size: 7.5 mm Number of attempts: 1 Airway Equipment and Method: Oral airway and Video-laryngoscopy Placement Confirmation: ETT inserted through vocal cords under direct vision,  positive ETCO2 and breath sounds checked- equal and bilateral Tube secured with: Tape Dental Injury: Teeth and Oropharynx as per pre-operative assessment

## 2015-09-25 ENCOUNTER — Encounter (HOSPITAL_COMMUNITY): Payer: Self-pay | Admitting: General Surgery

## 2016-10-01 ENCOUNTER — Encounter (HOSPITAL_COMMUNITY): Payer: Self-pay | Admitting: Emergency Medicine

## 2016-10-01 ENCOUNTER — Emergency Department (HOSPITAL_COMMUNITY)
Admission: EM | Admit: 2016-10-01 | Discharge: 2016-10-01 | Disposition: A | Discharge status: Home / Self Care | Payer: Self-pay | Attending: Emergency Medicine | Admitting: Emergency Medicine

## 2016-10-01 DIAGNOSIS — G4733 Obstructive sleep apnea (adult) (pediatric): Secondary | ICD-10-CM | POA: Insufficient documentation

## 2016-10-01 DIAGNOSIS — J45909 Unspecified asthma, uncomplicated: Secondary | ICD-10-CM | POA: Insufficient documentation

## 2016-10-01 DIAGNOSIS — Z79899 Other long term (current) drug therapy: Secondary | ICD-10-CM | POA: Insufficient documentation

## 2016-10-01 DIAGNOSIS — Z87891 Personal history of nicotine dependence: Secondary | ICD-10-CM | POA: Insufficient documentation

## 2016-10-01 DIAGNOSIS — H6091 Unspecified otitis externa, right ear: Secondary | ICD-10-CM | POA: Insufficient documentation

## 2016-10-01 DIAGNOSIS — I10 Essential (primary) hypertension: Secondary | ICD-10-CM | POA: Insufficient documentation

## 2016-10-01 MED ORDER — IBUPROFEN 800 MG PO TABS
800.0000 mg | ORAL_TABLET | Freq: Once | ORAL | Status: AC
  Administered 2016-10-01: 800 mg via ORAL
  Filled 2016-10-01: qty 1

## 2016-10-01 MED ORDER — PSEUDOEPHEDRINE HCL 60 MG PO TABS
60.0000 mg | ORAL_TABLET | Freq: Once | ORAL | Status: AC
  Administered 2016-10-01: 60 mg via ORAL
  Filled 2016-10-01: qty 1

## 2016-10-01 MED ORDER — OFLOXACIN 0.3 % OP SOLN
1.0000 [drp] | Freq: Four times a day (QID) | OPHTHALMIC | Status: DC
  Filled 2016-10-01: qty 5

## 2016-10-01 MED ORDER — LORATADINE-PSEUDOEPHEDRINE ER 5-120 MG PO TB12: 1.0000 | ORAL_TABLET | Freq: Two times a day (BID) | ORAL | 0 refills | Status: DC

## 2016-10-01 MED ORDER — AMOXICILLIN 500 MG PO CAPS: 500.0000 mg | ORAL_CAPSULE | Freq: Three times a day (TID) | ORAL | 0 refills | Status: DC

## 2016-10-01 MED ORDER — CIPROFLOXACIN-DEXAMETHASONE 0.3-0.1 % OT SUSP
4.0000 [drp] | Freq: Two times a day (BID) | OTIC | Status: DC
  Administered 2016-10-01: 4 [drp] via OTIC
  Filled 2016-10-01: qty 7.5

## 2016-10-01 MED ORDER — ACETAMINOPHEN 325 MG PO TABS
650.0000 mg | ORAL_TABLET | Freq: Once | ORAL | Status: AC
  Administered 2016-10-01: 650 mg via ORAL
  Filled 2016-10-01: qty 2

## 2016-10-01 NOTE — Discharge Instructions (Signed)
Your blood pressure is slightly elevated at 160/89. Please have this rechecked. You have swelling of the external canal of your right ear. Please use 4 drops of ciprofloxacin 2 times daily. Use amoxil three times daily. Use claritin D for congestion. See Dr Suszanne Conners for Ear Nose and Throat evaluation as soon as possible for your obstructive airway disease.

## 2016-10-01 NOTE — ED Triage Notes (Signed)
Right sided sinus pressure and right ear pain with crackling and popping since 9-13.

## 2016-10-01 NOTE — ED Notes (Signed)
PA Beverely Pace in with pt at this time.

## 2016-10-01 NOTE — ED Provider Notes (Signed)
[facility]-EM[facility]Y DEPT Provider Note   CSN: [id number] Arrival date & time: 10/01/16  1003     History   Chief Complaint Chief Complaint  Patient presents with  . Otalgia    HPI John Cook is a 46 y.o. male.  The history is provided by the patient.  Otalgia  This is a new problem. The current episode started more than 1 week ago. There is pain in the right ear. The problem occurs daily. The problem has been gradually worsening. There has been no fever. The pain is moderate. Associated symptoms include headaches. Pertinent negatives include no ear discharge, no sore throat, no abdominal pain, no neck pain and no cough. Associated symptoms comments: Sinus pressure.    Past Medical History:  Diagnosis Date  . Arthritis    back  . Asthma   . GERD (gastroesophageal reflux disease)   . History of hiatal hernia   . Hyperlipidemia   . Hypertension   . Neuropathy    legs  . Pre-diabetes     There are no active problems to display for this patient.   Past Surgical History:  Procedure Laterality Date  . CATARACT EXTRACTION Bilateral    with lens implant  . INCISION AND DRAINAGE ABSCESS Left 09/24/2015   Procedure: INCISION AND DRAINAGE OF LEFT NECK ABSCESS;  Surgeon: Chevis Pretty III, MD;  Location: [facility] OR;  Service: General;  Laterality: Left;       Home Medications    Prior to Admission medications   Medication Sig Start Date End Date Taking? Authorizing Provider  acetaminophen (TYLENOL) 650 MG CR tablet Take 1,950 mg by mouth every 8 (eight) hours as needed for pain.   Yes [provider]  aspirin EC 325 MG tablet Take 325 mg by mouth daily as needed for moderate pain.    Yes [provider]  atenolol (TENORMIN) 25 MG tablet Take 25 mg by mouth daily.   Yes [provider]  gemfibrozil (LOPID) 600 MG tablet Take 600 mg by mouth 2 (two) times daily before a meal.   Yes [provider]  hydrochlorothiazide (HYDRODIURIL) 25 MG tablet Take 25  mg by mouth daily.   Yes [provider]  Ipratropium-Albuterol (COMBIVENT) 20-100 MCG/ACT AERS respimat Inhale 1 puff into the lungs every 6 (six) hours as needed for wheezing.   Yes [provider]  lovastatin (MEVACOR) 20 MG tablet Take 20 mg by mouth at bedtime.   Yes [provider]  meloxicam (MOBIC) 15 MG tablet Take 15 mg by mouth daily.   Yes [provider]  omeprazole (PRILOSEC) 20 MG capsule Take 20 mg by mouth daily.   Yes [provider]  naproxen (NAPROSYN) 500 MG tablet Take 500 mg by mouth 2 (two) times daily as needed for moderate pain.     [provider]    Family History Family History  Problem Relation Age of Onset  . Hypertension Mother   . COPD Mother   . Heart disease Mother   . Diabetes Mother   . Heart attack Father     Social History Social History  Substance Use Topics  . Smoking status: Former Smoker    Types: Cigarettes    Quit date: 09/23/1995  . Smokeless tobacco: Never Used  . Alcohol use Yes     Comment: occasional     Allergies   Lisinopril   Review of Systems Review of Systems  Constitutional: Negative for activity change, chills and fever.  All ROS Neg except as noted in HPI  HENT: Positive for congestion, ear pain and sinus pressure. Negative for ear discharge, nosebleeds and sore throat.   Eyes: Negative for photophobia and discharge.  Respiratory: Negative for cough, shortness of breath and wheezing.   Cardiovascular: Negative for chest pain and palpitations.  Gastrointestinal: Negative for abdominal pain and blood in stool.  Genitourinary: Negative for dysuria, frequency and hematuria.  Musculoskeletal: Negative for arthralgias, back pain and neck pain.  Skin: Negative.   Neurological: Positive for headaches. Negative for dizziness, seizures and speech difficulty.  Psychiatric/Behavioral: Negative for confusion and hallucinations.     Physical Exam Updated Vital  Signs BP (!) 160/89 (BP Location: Left Arm)   Pulse 92   Temp 98.9 F (37.2 C) (Oral)   Resp 18   Ht  (1.651 m)   Wt (!) 158.8 kg (350 lb)   SpO2 98%   BMI 58.24 kg/m   Physical Exam  Constitutional: He is oriented to person, place, and time. He appears well-developed and well-nourished.  Non-toxic appearance.  HENT:  Head: Normocephalic.  Right Ear: Tympanic membrane and external ear normal.  Left Ear: Tympanic membrane and external ear normal.  There is swelling of the right EAC. Can not visualize the TM. No drainage. No swelling or redness of the right or left mastoid. Left TM slightly red, but no bulging. Partial obstruction of posterior pharynx - not new. Snoring noted. Mild pain to percussion of the sinuses.  Eyes: Pupils are equal, round, and reactive to light. EOM and lids are normal.  Neck: Normal range of motion. Neck supple. Carotid bruit is not present.  Cardiovascular: Normal rate, regular rhythm, normal heart sounds, intact distal pulses and normal pulses.   Pulmonary/Chest: Breath sounds normal. No respiratory distress.  Abdominal: Soft. Bowel sounds are normal. There is no tenderness. There is no guarding.  Musculoskeletal: Normal range of motion.  Lymphadenopathy:       Head (right side): No submandibular adenopathy present.       Head (left side): No submandibular adenopathy present.    He has no cervical adenopathy.  Neurological: He is alert and oriented to person, place, and time. He has normal strength. No cranial nerve deficit or sensory deficit.  Skin: Skin is warm and dry.  Psychiatric: He has a normal mood and affect. His speech is normal.  Nursing note and vitals reviewed.    ED Treatments / Results  Labs (all labs ordered are listed, but only abnormal results are displayed) Labs Reviewed - No data to display  EKG  EKG Interpretation None       Radiology No results found.  Procedures Procedures (including critical care  time)  Medications Ordered in ED Medications - No data to display   Initial Impression / Assessment and Plan / ED Course  I have reviewed the triage vital signs and the nursing notes.  Pertinent labs & imaging results that were available during my care of the patient were reviewed by me and considered in my medical decision making (see chart for details).       Final Clinical Impressions(s) / ED Diagnoses MDM The examination reveals external otitis on the right. The eardrum cannot be visualized. The patient will be treated with antibiotic eardrops, and Amoxil.patient has some obstructive airway disease.Tero he also has some sinusitis. The patient will use Claritin-D for this. He is strongly advised to see Dr.Teoh for ear nose and throat evaluation as soon as possible. Patient is  in agreement with this plan.   Final diagnoses:  Otitis externa of right ear, unspecified chronicity, unspecified type  Obstructive apnea    New Prescriptions New Prescriptions   AMOXICILLIN (AMOXIL) 500 MG CAPSULE    Take 1 capsule (500 mg total) by mouth 3 (three) times daily.   LORATADINE-PSEUDOEPHEDRINE (CLARITIN-D 12 HOUR) 5-120 MG TABLET    Take 1 tablet by mouth 2 (two) times daily.     Ivery Quale, PA-C 10/01/16 1218    Donnetta Hutching, MD 10/02/16 [phone]

## 2016-10-17 ENCOUNTER — Ambulatory Visit (INDEPENDENT_AMBULATORY_CARE_PROVIDER_SITE_OTHER): Payer: Self-pay | Admitting: Otolaryngology

## 2016-10-17 DIAGNOSIS — H9311 Tinnitus, right ear: Secondary | ICD-10-CM

## 2016-10-17 DIAGNOSIS — J342 Deviated nasal septum: Secondary | ICD-10-CM

## 2016-10-17 DIAGNOSIS — J310 Chronic rhinitis: Secondary | ICD-10-CM

## 2016-10-17 DIAGNOSIS — H903 Sensorineural hearing loss, bilateral: Secondary | ICD-10-CM

## 2016-10-18 NOTE — Congregational Nurse Program (Signed)
Congregational Nurse Program Note  Date of Encounter: 10/17/2016  Past Medical History: Past Medical History:  Diagnosis Date  . Arthritis    back  . Asthma   . GERD (gastroesophageal reflux disease)   . History of hiatal hernia   . Hyperlipidemia   . Hypertension   . Neuropathy    legs  . Pre-diabetes     Encounter Details:     CNP Questionnaire - 10/17/16 1415      Patient Demographics   Is this a new or existing patient? Existing   Patient is considered a/an Not Applicable   Race Caucasian/White     Patient Assistance   [facility] of [facility]   Patient's financial/insurance status Self-Pay (Uninsured)   Uninsured Patient ([place]/[facility] Connects) No   Patient referred to apply for the following financial assistance Not Applicable   Food insecurities addressed Not Applicable   Transportation assistance No   Assistance securing medications No   Educational Programmer, systems the healthcare system     Encounter Details   Primary purpose of visit Navigating the Healthcare System   Was an Emergency Department visit averted? Not Applicable   Does patient have a medical provider? Yes   Patient referred to Follow up with established PCP   Was a mental health screening completed? (GAINS tool) No   Does patient have dental issues? No   Does patient have vision issues? No   Does your patient have an abnormal blood pressure today? No   Since previous encounter, have you referred patient for abnormal blood pressure that resulted in a new diagnosis or medication change? No   Does your patient have an abnormal blood glucose today? No   Since previous encounter, have you referred patient for abnormal blood glucose that resulted in a new diagnosis or medication change? No     Client was assisted with referral forms for scheduled appointments at [facility].  Appointments 11/14/16 for labs, and 04/13/17 for 6 months followup. Encouraged client to call if  any further needs or questions.  Hewitt Shorts, RN  [phone].

## 2017-04-25 ENCOUNTER — Other Ambulatory Visit (HOSPITAL_COMMUNITY): Payer: Self-pay | Admitting: *Deleted

## 2017-04-25 ENCOUNTER — Ambulatory Visit (HOSPITAL_COMMUNITY)
Admission: RE | Admit: 2017-04-25 | Discharge: 2017-04-25 | Disposition: A | Discharge status: Home / Self Care | Payer: Self-pay | Source: Ambulatory Visit | Attending: *Deleted | Admitting: *Deleted

## 2017-04-25 DIAGNOSIS — R0602 Shortness of breath: Secondary | ICD-10-CM

## 2017-05-01 ENCOUNTER — Telehealth: Payer: Self-pay

## 2017-05-01 NOTE — Telephone Encounter (Signed)
Telephone to client to followup on [facility]  [facility].  [facility]16/17 11:00 to pick up referral form and recheck BP. Client agrees to meet next Monday 05/08/17.   Pearletha AlfredJan Kenzlei Runions,RN  [phone].

## 2017-05-08 NOTE — Congregational Nurse Program (Signed)
Congregational Nurse Program Note  Date of Encounter: 05/08/2017  Past Medical History: Past Medical History:  Diagnosis Date  . Arthritis    back  . Asthma   . GERD (gastroesophageal reflux disease)   . History of hiatal hernia   . Hyperlipidemia   . Hypertension   . Neuropathy    legs  . Pre-diabetes     Encounter Details: CNP Questionnaire - 05/08/17 1030      Questionnaire   Patient Status  Not Applicable    Race  White or Caucasian    Location Patient Served At  [facility]  Not Applicable    Uninsured  Not Applicable    Food  Yes, have food insecurities    Housing/Utilities  Yes, have permanent housing    Transportation  No transportation needs    Interpersonal Safety  Yes, feel physically and emotionally safe where you currently live    Medication  No medication insecurities    Medical Provider  Yes    Referrals  Area Agency    ED Visit Averted  Not Applicable      Client was [facility] at [facility] for assistance with a referral form for next appointment at [facility] 06/01/17 at 11:00 to follow up on BP. Client has hypertension and is currently taking medication. On recent visit to [facility] BP was elevated and medication was increased. BP was within normal range today(see vitals).  Client was given information about Nutrition and Diabetes classes offered at [facility]. Client is also pre-diabetic. Client lives with his Mother is very much involved in her care and support.  A very positive and pleasant visit with client and a time to catch up on overall health concerns. Encouraged client to call if any concerns or need to talk.  Pearletha Alfred  [phone].

## 2017-05-15 ENCOUNTER — Telehealth: Payer: Self-pay

## 2017-05-15 NOTE — Telephone Encounter (Signed)
Telephone call to client to followup on message received from CNP office, client needed to speak with CNP.  Client had concerns about medication currently on for arthritis. Client is a patient at [facility] under [facility]. Client states PCP is discontinuing the medication due to reasons can affect kidneys. Client is anxious about this decision, pain is controlled with medication. Instructed client to discuss with PCP, sure there is a alternative safe medication to help with his pain. Support and encouragement offered. Client agreed to talk with PCP.  Call if any further needs or concerns.  Hewitt Shorts, RN  [phone].

## 2017-06-13 ENCOUNTER — Telehealth: Payer: Self-pay

## 2017-06-13 NOTE — Telephone Encounter (Signed)
Returned call to client.  Client had a blister on r.leg that had ruptured at [facility]  Client was given an antibiotic for an the infection. [facility]. Will follow up if needed for infection in leg.  Client also has an appointment 07/21/17 for labwork related to other medical issues.  [facility]  Pearletha AlfredJan Thamara Leger,RN  [phone].

## 2017-07-07 NOTE — Congregational Nurse Program (Signed)
Congregational Nurse Program Note  Date of Encounter: 10/17/2016  Past Medical History: Past Medical History:  Diagnosis Date  . Arthritis    back  . Asthma   . GERD (gastroesophageal reflux disease)   . History of hiatal hernia   . Hyperlipidemia   . Hypertension   . Neuropathy    legs  . Pre-diabetes     EnClcounter Details:   [facility]CP.  Pearletha AlfredJan Lurline Caver,RN  [phone].

## 2017-09-20 NOTE — Congregational Nurse Program (Signed)
Congregational Nurse Program Note  Date of Encounter: 09/20/2017  Past Medical History: Past Medical History:  Diagnosis Date  . Arthritis    back  . Asthma   . GERD (gastroesophageal reflux disease)   . History of hiatal hernia   . Hyperlipidemia   . Hypertension   . Neuropathy    legs  . Pre-diabetes     Encounter Details: CNP Questionnaire - 09/20/17 1023      Questionnaire   Patient Status  Not Applicable    Race  White or Caucasian    Location Patient Served At  [facility]  Not Applicable    Uninsured  Uninsured (Subsequent visits/quarter)    Food  Yes, have food insecurities    Housing/Utilities  Yes, have permanent housing    Transportation  No transportation needs    Interpersonal Safety  Yes, feel physically and emotionally safe where you currently live    Medication  No medication insecurities    Medical Provider  Yes    Referrals  Area Agency    ED Visit Averted  Not Applicable    Life-Saving Intervention Made  Not Applicable     Client was seen today to touch base on concerns with changes in medications prescribed by primary provider for hyperlipidemia causing side affects. Offered support to client, understands reasoning and is compliant with changes. Client is in need of  Assistance with dental health.  Information given about [facility] Program and will call for appointment to apply. Encouraged to call if any questions of concerns.  Hewitt Shorts, RN  [phone].

## 2017-10-10 ENCOUNTER — Encounter: Payer: Self-pay | Admitting: Cardiology

## 2017-10-10 NOTE — Progress Notes (Signed)
  Cardiology Office Note  Date: 10/11/2017   ID: John Cook, DOB 06/26/1970, MRN [id number]  PCP: Muse, Rochelle D., PA-C  Consulting Cardiologist: Eldin Bonsell, MD   Chief Complaint  Patient presents with  . Chest Pain    History of Present Illness: John Cook is a 47 y.o. male referred for cardiology consultation [facility] of chest discomfort.  We discussed symptoms today.  He states that he intermittently experiences a tightness/heaviness in his chest, not always with exertion, but typically associated with fatigue and shortness of breath.  Actually, this has been going on for the last 5 years or so, however he has had more frequent episodes within the last 6 months.  He does not report any palpitations or syncope.  States that he usually just has to "let it pass."  Cardiac risk factors include morbid obesity, hypertension, hyperlipidemia, and family history of CAD.  His father died suddenly in his early 50s with a heart attack.  He has a previous history of tobacco use but quit greater than 20 years ago.  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  I personally reviewed his ECG today which shows a sinus rhythm with low voltage.  He reports compliance with his current medications.  Past Medical History:  Diagnosis Date  . Arthritis   . Asthma   . GERD (gastroesophageal reflux disease)   . History of hiatal hernia   . Hyperlipidemia   . Hypertension   . Mixed hyperlipidemia   . Neuropathy   . Prediabetes     Past Surgical History:  Procedure Laterality Date  . CATARACT EXTRACTION Bilateral    with lens implant  . INCISION AND DRAINAGE ABSCESS Left 09/24/2015   Procedure: INCISION AND DRAINAGE OF LEFT NECK ABSCESS;  Surgeon: Paul Toth III, MD;  Location: [facility] OR;  Service: General;  Laterality: Left;    Current Outpatient Medications    Medication Sig Dispense Refill  . acetaminophen (TYLENOL) 650 MG CR tablet Take 1,950 mg by mouth every 8 (eight) hours as needed for pain.    . aspirin 81 MG tablet Take 81 mg by mouth as needed for pain (only takes occasionally).    . atenolol (TENORMIN) 25 MG tablet Take 25 mg by mouth daily.    . gabapentin (NEURONTIN) 300 MG capsule Take 300 mg by mouth 2 (two) times daily.    . hydrochlorothiazide (HYDRODIURIL) 25 MG tablet Take 25 mg by mouth daily.    . Ipratropium-Albuterol (COMBIVENT) 20-100 MCG/ACT AERS respimat Inhale 1 puff into the lungs every 6 (six) hours as needed for wheezing.    . omeprazole (PRILOSEC) 20 MG capsule Take 20 mg by mouth daily.    . oxybutynin (DITROPAN) 5 MG tablet Take 5 mg by mouth daily as needed for bladder spasms.     No current facility-administered medications for this visit.    Allergies:  Lisinopril   Social History: The patient  reports that he quit smoking about 22 years ago. His smoking use included cigarettes. He has never used smokeless tobacco. He reports that he drinks alcohol. He reports that he does not use drugs.   Family History: The patient's family history includes COPD in his mother; Diabetes in his mother; Heart attack in his father; Heart disease in his mother; Hypertension in his mother.   ROS:  Please see the   history of present illness. Otherwise, complete review of systems is positive for neuropathy and chronic back pain.  All other systems are reviewed and negative.   Physical Exam: VS:  BP (!) 152/88   Pulse 73   Ht 5' 5" (1.651 m)   Wt (!) 373 lb 6.4 oz (169.4 kg)   SpO2 93%   BMI 62.14 kg/m , BMI Body mass index is 62.14 kg/m.  Wt Readings from Last 3 Encounters:  10/11/17 (!) 373 lb 6.4 oz (169.4 kg)  10/01/16 (!) 350 lb (158.8 kg)  09/24/15 (!) 340 lb (154.2 kg)    General: Morbidly obese male, appears comfortable at rest. HEENT: Conjunctiva and lids normal, oropharynx clear. Neck: Supple, no elevated JVP or  carotid bruits, no thyromegaly. Lungs: Clear to auscultation, nonlabored breathing at rest. Cardiac: Distant, RRR, no S3 or significant systolic murmur. Abdomen: Morbidly obese, bowel sounds present, no guarding or rebound. Extremities: Venous stasis distally, distal pulses 1-2+. Skin: Warm and dry. Musculoskeletal: No kyphosis. Neuropsychiatric: Alert and oriented x3, affect grossly appropriate.  ECG: I personally reviewed the tracing from 09/24/2015 which showed normal sinus rhythm.  Recent Labwork:  July 2019: Cholesterol 264, HDL 29, triglycerides 625, LDL not calculated  Other Studies Reviewed Today:  Chest x-ray 04/25/2017: FINDINGS: The heart size and mediastinal contours are within normal limits. Both lungs are clear. The visualized skeletal structures are unremarkable.  IMPRESSION: No active cardiopulmonary disease.  Assessment and Plan:  1.  Intermittent episodes of chest tightness and shortness of breath, description has both typical and atypical features for angina.  He has a high pretest probability of CAD in the setting of morbid obesity, hypertension, hyperlipidemia, prediabetes, and family history of premature CAD in his father.  ECG is overall nonspecific.  Due to his weight and body habitus, noninvasive cardiac imaging is likely to be fraught with artifact.  I talked with him about the possibility of scheduling an outpatient diagnostic cardiac catheterization, optimally via radial approach.  We reviewed the risks and benefits and he is in agreement to proceed, although needs to talk with a family member about coordinating a time for the procedure.  He will plan to call us back this week.  2.  Essential hypertension, currently on atenolol and HCTZ.  Blood pressure is mildly elevated today.  [facility].  3.  Morbid obesity.  4.  History of mixed hyperlipidemia, lipid numbers in July showed fairly significant hypertriglyceridemia and elevated  total cholesterol with low HDL consistent with metabolic syndrome.  He has not been on directed medical therapy recently.  5.  Remote history of tobacco use.  Previous work around gravel dust as well.  Certainly possible that his symptoms could be pulmonary in etiology with restrictive/obstructive picture.  He does state that he wheezes at times and has Combivent available.  Current medicines were reviewed with the patient today.   Orders Placed This Encounter  Procedures  . EKG 12-Lead    Disposition: To call back regarding timing of cardiac catheterization to be done in the near future.  Signed, Raunak Antuna G. Keiyon Plack, MD, FACC 10/11/2017 1:40 PM    [facility] [place] [address], [place], [place] [address] Phone: [phone]; Fax: [phone] 

## 2017-10-10 NOTE — H&P (View-Only) (Signed)
Cardiology Office Note  Date: 10/11/2017   ID: John Cook, DOB 07-21-1970, MRN [id number]  PCP: Tylene Fantasia., PA-C  Consulting Cardiologist: Nona Dell, MD   Chief Complaint  Patient presents with  . Chest Pain    History of Present Illness: John Cook is a 47 y.o. male referred for cardiology consultation by [facility]. Muse [facility] for the evaluation of chest discomfort.  We discussed symptoms today.  He states that he intermittently experiences a tightness/heaviness in his chest, not always with exertion, but typically associated with fatigue and shortness of breath.  Actually, this has been going on for the last 5 years or so, however he has had more frequent episodes within the last 6 months.  He does not report any palpitations or syncope.  States that he usually just has to "let it pass."  Cardiac risk factors include morbid obesity, hypertension, hyperlipidemia, and family history of CAD.  His father died suddenly in his early 22s with a heart attack.  He has a previous history of tobacco use but quit greater than 20 years ago.  He does not report any known chronic lung disease, states that he previously worked in [employer] and around [employer].  I personally reviewed his ECG today which shows a sinus rhythm with low voltage.  He reports compliance with his current medications.  Past Medical History:  Diagnosis Date  . Arthritis   . Asthma   . GERD (gastroesophageal reflux disease)   . History of hiatal hernia   . Hyperlipidemia   . Hypertension   . Mixed hyperlipidemia   . Neuropathy   . Prediabetes     Past Surgical History:  Procedure Laterality Date  . CATARACT EXTRACTION Bilateral    with lens implant  . INCISION AND DRAINAGE ABSCESS Left 09/24/2015   Procedure: INCISION AND DRAINAGE OF LEFT NECK ABSCESS;  Surgeon: Chevis Pretty III, MD;  Location: [facility] OR;  Service: General;  Laterality: Left;    Current Outpatient Medications    Medication Sig Dispense Refill  . acetaminophen (TYLENOL) 650 MG CR tablet Take 1,950 mg by mouth every 8 (eight) hours as needed for pain.    Marland Kitchen aspirin 81 MG tablet Take 81 mg by mouth as needed for pain (only takes occasionally).    Marland Kitchen atenolol (TENORMIN) 25 MG tablet Take 25 mg by mouth daily.    Marland Kitchen gabapentin (NEURONTIN) 300 MG capsule Take 300 mg by mouth 2 (two) times daily.    . hydrochlorothiazide (HYDRODIURIL) 25 MG tablet Take 25 mg by mouth daily.    . Ipratropium-Albuterol (COMBIVENT) 20-100 MCG/ACT AERS respimat Inhale 1 puff into the lungs every 6 (six) hours as needed for wheezing.    Marland Kitchen omeprazole (PRILOSEC) 20 MG capsule Take 20 mg by mouth daily.    Marland Kitchen oxybutynin (DITROPAN) 5 MG tablet Take 5 mg by mouth daily as needed for bladder spasms.     No current facility-administered medications for this visit.    Allergies:  Lisinopril   Social History: The patient  reports that he quit smoking about 22 years ago. His smoking use included cigarettes. He has never used smokeless tobacco. He reports that he drinks alcohol. He reports that he does not use drugs.   Family History: The patient's family history includes COPD in his mother; Diabetes in his mother; Heart attack in his father; Heart disease in his mother; Hypertension in his mother.   ROS:  Please see the  history of present illness. Otherwise, complete review of systems is positive for neuropathy and chronic back pain.  All other systems are reviewed and negative.   Physical Exam: VS:  BP (!) 152/88   Pulse 73   Ht 5\' 5"  (1.651 m)   Wt (!) 373 lb 6.4 oz (169.4 kg)   SpO2 93%   BMI 62.14 kg/m , BMI Body mass index is 62.14 kg/m.  Wt Readings from Last 3 Encounters:  10/11/17 (!) 373 lb 6.4 oz (169.4 kg)  10/01/16 (!) 350 lb (158.8 kg)  09/24/15 (!) 340 lb (154.2 kg)    General: Morbidly obese male, appears comfortable at rest. HEENT: Conjunctiva and lids normal, oropharynx clear. Neck: Supple, no elevated JVP or  carotid bruits, no thyromegaly. Lungs: Clear to auscultation, nonlabored breathing at rest. Cardiac: Distant, RRR, no S3 or significant systolic murmur. Abdomen: Morbidly obese, bowel sounds present, no guarding or rebound. Extremities: Venous stasis distally, distal pulses 1-2+. Skin: Warm and dry. Musculoskeletal: No kyphosis. Neuropsychiatric: Alert and oriented x3, affect grossly appropriate.  ECG: I personally reviewed the tracing from 09/24/2015 which showed normal sinus rhythm.  Recent Labwork:  July 2019: Cholesterol 264, HDL 29, triglycerides 625, LDL not calculated  Other Studies Reviewed Today:  Chest x-ray 04/25/2017: FINDINGS: The heart size and mediastinal contours are within normal limits. Both lungs are clear. The visualized skeletal structures are unremarkable.  IMPRESSION: No active cardiopulmonary disease.  Assessment and Plan:  1.  Intermittent episodes of chest tightness and shortness of breath, description has both typical and atypical features for angina.  He has a high pretest probability of CAD in the setting of morbid obesity, hypertension, hyperlipidemia, prediabetes, and family history of premature CAD in his father.  ECG is overall nonspecific.  Due to his weight and body habitus, noninvasive cardiac imaging is likely to be fraught with artifact.  I talked with him about the possibility of scheduling an outpatient diagnostic cardiac catheterization, optimally via radial approach.  We reviewed the risks and benefits and he is in agreement to proceed, although needs to talk with a family member about coordinating a time for the procedure.  He will plan to call us back this week.  2.  Essential hypertension, currently on atenolol and HCTZ.  Blood pressure is mildly elevated today.  [facility].  3.  Morbid obesity.  4.  History of mixed hyperlipidemia, lipid numbers in July showed fairly significant hypertriglyceridemia and elevated  total cholesterol with low HDL consistent with metabolic syndrome.  He has not been on directed medical therapy recently.  5.  Remote history of tobacco use.  Previous work around [employer] as well.  Certainly possible that his symptoms could be pulmonary in etiology with restrictive/obstructive picture.  He does state that he wheezes at times and has Combivent available.  Current medicines were reviewed with the patient today.   Orders Placed This Encounter  Procedures  . EKG 12-Lead    Disposition: To call back regarding timing of cardiac catheterization to be done in the near future.  Signed, Jonelle Sidle, MD, [facility] 10/11/2017 1:40 PM    [facility] at [facility] [address] [place], [place], [place] [address] Phone: [phone]; Fax: [phone]

## 2017-10-11 ENCOUNTER — Ambulatory Visit (INDEPENDENT_AMBULATORY_CARE_PROVIDER_SITE_OTHER): Payer: Self-pay | Admitting: Cardiology

## 2017-10-11 ENCOUNTER — Encounter: Payer: Self-pay | Admitting: Cardiology

## 2017-10-11 VITALS — BP 152/88 | HR 73 | Ht 65.0 in | Wt 373.4 lb

## 2017-10-11 DIAGNOSIS — E782 Mixed hyperlipidemia: Secondary | ICD-10-CM

## 2017-10-11 DIAGNOSIS — I10 Essential (primary) hypertension: Secondary | ICD-10-CM

## 2017-10-11 DIAGNOSIS — I209 Angina pectoris, unspecified: Secondary | ICD-10-CM

## 2017-10-11 DIAGNOSIS — Z01810 Encounter for preprocedural cardiovascular examination: Secondary | ICD-10-CM

## 2017-10-11 DIAGNOSIS — F17201 Nicotine dependence, unspecified, in remission: Secondary | ICD-10-CM

## 2017-10-11 DIAGNOSIS — Z8249 Family history of ischemic heart disease and other diseases of the circulatory system: Secondary | ICD-10-CM

## 2017-10-11 NOTE — Patient Instructions (Addendum)
Medication Instructions:   Your physician recommends that you continue on your current medications as directed. Please refer to the Current Medication list given to you today.  Labwork:  Pending-heart cath  Testing/Procedures: Your physician has requested that you have a cardiac catheterization. Cardiac catheterization is used to diagnose and/or treat various heart conditions. Doctors may recommend this procedure for a number of different reasons. The most common reason is to evaluate chest pain. Chest pain can be a symptom of coronary artery disease (CAD), and cardiac catheterization can show whether plaque is narrowing or blocking your heart's arteries. This procedure is also used to evaluate the valves, as well as measure the blood flow and oxygen levels in different parts of your heart. For further information please visit [email or web address] Please follow instruction sheet, as given. Please call our office with dates and times that are suitable for you.  Follow-Up:  Your physician recommends that you schedule a follow-up appointment in: 1 month after heart cath-pending cath date.  Any Other Special Instructions Will Be Listed Below (If Applicable).     [facility] CARDIOVASCULAR DIVISION [facility] [address] [place] [address] Dept: [phone] Loc: [phone]  John Cook  10/12/2017  You are scheduled for a Cardiac Catheterization on Monday, October 7 with Dr. Cristal Deer End.  1. Please arrive at the [facility] (Main Entrance A) at [facility]: [address] [place], [place] [address] at 7:00 AM (This time is two hours before your procedure to ensure your preparation). Free valet parking service is available.   Special note: Every effort is made to have your procedure done on time. Please understand that emergencies sometimes delay scheduled procedures.  2. Diet: Do not eat solid foods after  midnight.  The patient may have clear liquids until 5am upon the day of the procedure.  3. Labs: You will need to have blood drawn on Thursday, October 3 at [facility] [address] [place]  Open: 7am - 4pm. You do not need to be fasting.  4. Medication instructions in preparation for your procedure: Hold your hydrochlorothiazide on the morning of your procedure.   Contrast Allergy: No   On the morning of your procedure, take your Aspirin 81 mg and any morning medicines NOT listed above.  You may use sips of water.  5. Plan for one night stay--bring personal belongings. 6. Bring a current list of your medications and current insurance cards. 7. You MUST have a responsible person to drive you home. 8. Someone MUST be with you the first 24 hours after you arrive home or your discharge will be delayed. 9. Please wear clothes that are easy to get on and off and wear slip-on shoes.  Thank you for allowing [place] to care for you!    -- [facility]  If you need a refill on your cardiac medications before your next appointment, please call your pharmacy.

## 2017-10-12 ENCOUNTER — Other Ambulatory Visit (HOSPITAL_COMMUNITY)
Admission: RE | Admit: 2017-10-12 | Discharge: 2017-10-12 | Disposition: A | Discharge status: Home / Self Care | Payer: Self-pay | Source: Ambulatory Visit | Attending: Cardiology | Admitting: Cardiology

## 2017-10-12 ENCOUNTER — Other Ambulatory Visit: Payer: Self-pay | Admitting: *Deleted

## 2017-10-12 ENCOUNTER — Telehealth: Payer: Self-pay | Admitting: Cardiology

## 2017-10-12 ENCOUNTER — Other Ambulatory Visit: Payer: Self-pay | Admitting: Cardiology

## 2017-10-12 DIAGNOSIS — Z01810 Encounter for preprocedural cardiovascular examination: Secondary | ICD-10-CM

## 2017-10-12 DIAGNOSIS — I209 Angina pectoris, unspecified: Secondary | ICD-10-CM | POA: Insufficient documentation

## 2017-10-12 DIAGNOSIS — I10 Essential (primary) hypertension: Secondary | ICD-10-CM

## 2017-10-12 LAB — CBC
HCT: 51.7 % (ref 39.0–52.0)
Hemoglobin: 16.3 g/dL (ref 13.0–17.0)
MCH: 28.2 pg (ref 26.0–34.0)
MCHC: 31.5 g/dL (ref 30.0–36.0)
MCV: 89.6 fL (ref 78.0–100.0)
Platelets: 203 K/uL (ref 150–400)
RBC: 5.77 MIL/uL (ref 4.22–5.81)
RDW: 15.6 % — ABNORMAL HIGH (ref 11.5–15.5)
WBC: 9.8 K/uL (ref 4.0–10.5)

## 2017-10-12 LAB — BASIC METABOLIC PANEL WITH GFR
Anion gap: 10 (ref 5–15)
BUN: 13 mg/dL (ref 6–20)
CO2: 31 mmol/L (ref 22–32)
Calcium: 9.2 mg/dL (ref 8.9–10.3)
Chloride: 98 mmol/L (ref 98–111)
Creatinine, Ser: 1.01 mg/dL (ref 0.61–1.24)
GFR calc Af Amer: >60 mL/min
GFR calc non Af Amer: >60 mL/min
Glucose, Bld: 126 mg/dL — ABNORMAL HIGH (ref 70–99)
Potassium: 3.9 mmol/L (ref 3.5–5.1)
Sodium: 139 mmol/L (ref 135–145)

## 2017-10-12 NOTE — Addendum Note (Signed)
Addended by: Eustace Moore on: 10/12/2017 08:21 AM   Modules accepted: Orders

## 2017-10-12 NOTE — Telephone Encounter (Signed)
Pre-cert Verification for the following procedure   Cath scheduled for 10-16-2017 Dr. Okey Dupre

## 2017-10-16 ENCOUNTER — Ambulatory Visit (HOSPITAL_COMMUNITY)
Admission: RE | Disposition: A | Discharge status: Home / Self Care | Payer: Self-pay | Source: Ambulatory Visit | Attending: Internal Medicine

## 2017-10-16 ENCOUNTER — Encounter (HOSPITAL_COMMUNITY): Payer: Self-pay | Admitting: Internal Medicine

## 2017-10-16 ENCOUNTER — Ambulatory Visit (HOSPITAL_COMMUNITY)
Admission: RE | Admit: 2017-10-16 | Discharge: 2017-10-16 | Disposition: A | Discharge status: Home / Self Care | Payer: Self-pay | Source: Ambulatory Visit | Attending: Internal Medicine | Admitting: Internal Medicine

## 2017-10-16 DIAGNOSIS — E782 Mixed hyperlipidemia: Secondary | ICD-10-CM | POA: Insufficient documentation

## 2017-10-16 DIAGNOSIS — I10 Essential (primary) hypertension: Secondary | ICD-10-CM | POA: Insufficient documentation

## 2017-10-16 DIAGNOSIS — I2089 Other forms of angina pectoris: Secondary | ICD-10-CM | POA: Diagnosis present

## 2017-10-16 DIAGNOSIS — I209 Angina pectoris, unspecified: Secondary | ICD-10-CM

## 2017-10-16 DIAGNOSIS — J45909 Unspecified asthma, uncomplicated: Secondary | ICD-10-CM | POA: Insufficient documentation

## 2017-10-16 DIAGNOSIS — G629 Polyneuropathy, unspecified: Secondary | ICD-10-CM | POA: Insufficient documentation

## 2017-10-16 DIAGNOSIS — Z9842 Cataract extraction status, left eye: Secondary | ICD-10-CM | POA: Insufficient documentation

## 2017-10-16 DIAGNOSIS — Z9889 Other specified postprocedural states: Secondary | ICD-10-CM | POA: Insufficient documentation

## 2017-10-16 DIAGNOSIS — R7303 Prediabetes: Secondary | ICD-10-CM | POA: Insufficient documentation

## 2017-10-16 DIAGNOSIS — Z833 Family history of diabetes mellitus: Secondary | ICD-10-CM | POA: Insufficient documentation

## 2017-10-16 DIAGNOSIS — I208 Other forms of angina pectoris: Secondary | ICD-10-CM

## 2017-10-16 DIAGNOSIS — M1990 Unspecified osteoarthritis, unspecified site: Secondary | ICD-10-CM | POA: Insufficient documentation

## 2017-10-16 DIAGNOSIS — Z8249 Family history of ischemic heart disease and other diseases of the circulatory system: Secondary | ICD-10-CM | POA: Insufficient documentation

## 2017-10-16 DIAGNOSIS — Z7982 Long term (current) use of aspirin: Secondary | ICD-10-CM | POA: Insufficient documentation

## 2017-10-16 DIAGNOSIS — Z6841 Body Mass Index (BMI) 40.0 and over, adult: Secondary | ICD-10-CM | POA: Insufficient documentation

## 2017-10-16 DIAGNOSIS — Z836 Family history of other diseases of the respiratory system: Secondary | ICD-10-CM | POA: Insufficient documentation

## 2017-10-16 DIAGNOSIS — Z79899 Other long term (current) drug therapy: Secondary | ICD-10-CM | POA: Insufficient documentation

## 2017-10-16 DIAGNOSIS — K219 Gastro-esophageal reflux disease without esophagitis: Secondary | ICD-10-CM | POA: Insufficient documentation

## 2017-10-16 DIAGNOSIS — Z888 Allergy status to other drugs, medicaments and biological substances status: Secondary | ICD-10-CM | POA: Insufficient documentation

## 2017-10-16 DIAGNOSIS — Z87891 Personal history of nicotine dependence: Secondary | ICD-10-CM | POA: Insufficient documentation

## 2017-10-16 DIAGNOSIS — Z9841 Cataract extraction status, right eye: Secondary | ICD-10-CM | POA: Insufficient documentation

## 2017-10-16 HISTORY — PX: LEFT HEART CATH AND CORONARY ANGIOGRAPHY: CATH118249

## 2017-10-16 SURGERY — LEFT HEART CATH AND CORONARY ANGIOGRAPHY
Anesthesia: LOCAL

## 2017-10-16 MED ORDER — ASPIRIN 81 MG PO CHEW: 81.0000 mg | CHEWABLE_TABLET | ORAL | Status: DC

## 2017-10-16 MED ORDER — SODIUM CHLORIDE 0.9 % IV SOLN: 250.0000 mL | INTRAVENOUS | Status: DC | PRN

## 2017-10-16 MED ORDER — HEPARIN SODIUM (PORCINE) 1000 UNIT/ML IJ SOLN
INTRAMUSCULAR | Status: DC | PRN
  Administered 2017-10-16: 5000 [IU] via INTRAVENOUS

## 2017-10-16 MED ORDER — FENTANYL CITRATE (PF) 100 MCG/2ML IJ SOLN
INTRAMUSCULAR | Status: AC
  Filled 2017-10-16: qty 2

## 2017-10-16 MED ORDER — HEPARIN (PORCINE) IN NACL 1000-0.9 UT/500ML-% IV SOLN
INTRAVENOUS | Status: AC
  Filled 2017-10-16: qty 1000

## 2017-10-16 MED ORDER — SODIUM CHLORIDE 0.9% FLUSH: 3.0000 mL | Freq: Two times a day (BID) | INTRAVENOUS | Status: DC

## 2017-10-16 MED ORDER — ACETAMINOPHEN 325 MG PO TABS: 650.0000 mg | ORAL_TABLET | ORAL | Status: DC | PRN

## 2017-10-16 MED ORDER — FENTANYL CITRATE (PF) 100 MCG/2ML IJ SOLN
INTRAMUSCULAR | Status: DC | PRN
  Administered 2017-10-16: 50 ug via INTRAVENOUS

## 2017-10-16 MED ORDER — IOHEXOL 350 MG/ML SOLN
INTRAVENOUS | Status: DC | PRN
  Administered 2017-10-16: 70 mL via INTRA_ARTERIAL

## 2017-10-16 MED ORDER — SODIUM CHLORIDE 0.9 % WEIGHT BASED INFUSION: 1.0000 mL/kg/h | INTRAVENOUS | Status: DC

## 2017-10-16 MED ORDER — SODIUM CHLORIDE 0.9 % IV SOLN: INTRAVENOUS | Status: DC

## 2017-10-16 MED ORDER — SODIUM CHLORIDE 0.9% FLUSH: 3.0000 mL | INTRAVENOUS | Status: DC | PRN

## 2017-10-16 MED ORDER — VERAPAMIL HCL 2.5 MG/ML IV SOLN
INTRAVENOUS | Status: AC
  Filled 2017-10-16: qty 2

## 2017-10-16 MED ORDER — MIDAZOLAM HCL 2 MG/2ML IJ SOLN
INTRAMUSCULAR | Status: AC
  Filled 2017-10-16: qty 2

## 2017-10-16 MED ORDER — LIDOCAINE HCL (PF) 1 % IJ SOLN
INTRAMUSCULAR | Status: DC | PRN
  Administered 2017-10-16: 2 mL

## 2017-10-16 MED ORDER — VERAPAMIL HCL 2.5 MG/ML IV SOLN
INTRAVENOUS | Status: DC | PRN
  Administered 2017-10-16: 10 mL via INTRA_ARTERIAL

## 2017-10-16 MED ORDER — MIDAZOLAM HCL 2 MG/2ML IJ SOLN
INTRAMUSCULAR | Status: DC | PRN
  Administered 2017-10-16: 1 mg via INTRAVENOUS

## 2017-10-16 MED ORDER — LIDOCAINE HCL (PF) 1 % IJ SOLN
INTRAMUSCULAR | Status: AC
  Filled 2017-10-16: qty 30

## 2017-10-16 MED ORDER — ONDANSETRON HCL 4 MG/2ML IJ SOLN: 4.0000 mg | Freq: Four times a day (QID) | INTRAMUSCULAR | Status: DC | PRN

## 2017-10-16 MED ORDER — SODIUM CHLORIDE 0.9 % WEIGHT BASED INFUSION
3.0000 mL/kg/h | INTRAVENOUS | Status: AC
  Administered 2017-10-16: 3 mL/kg/h via INTRAVENOUS

## 2017-10-16 SURGICAL SUPPLY — 12 items
CATH 5FR JL3.5 JR4 ANG PIG MP (CATHETERS) ×1 IMPLANT
DEVICE RAD COMP TR BAND LRG (VASCULAR PRODUCTS) ×1 IMPLANT
GLIDESHEATH SLEND SS 6F .021 (SHEATH) ×1 IMPLANT
GUIDEWIRE INQWIRE 1.5J.035X260 (WIRE) IMPLANT
INQWIRE 1.5J .035X260CM (WIRE) ×2
KIT HEART LEFT (KITS) ×2 IMPLANT
PACK CARDIAC CATHETERIZATION (CUSTOM PROCEDURE TRAY) ×2 IMPLANT
SHEATH PROBE COVER 6X72 (BAG) ×1 IMPLANT
SHIELD RADPAD SCOOP 12X17 (MISCELLANEOUS) ×1 IMPLANT
SYR MEDRAD MARK V 150ML (SYRINGE) ×2 IMPLANT
TRANSDUCER W/STOPCOCK (MISCELLANEOUS) ×2 IMPLANT
TUBING CIL FLEX 10 FLL-RA (TUBING) ×2 IMPLANT

## 2017-10-16 NOTE — Interval H&P Note (Signed)
History and Physical Interval Note:  10/16/2017 9:59 AM  John Cook  has presented today for cardiology, with the diagnosis of atypical angina. The various methods of treatment have been discussed with the patient and family. After consideration of risks, benefits and other options for treatment, the patient has consented to  Procedure(s): LEFT HEART CATH AND CORONARY ANGIOGRAPHY (N/A) as a surgical intervention .  The patient's history has been reviewed, patient examined, no change in status, stable for surgery.  I have reviewed the patient's chart and labs.  Questions were answered to the patient's satisfaction.    Cath Lab Visit (complete for each Cath Lab visit)  Clinical Evaluation Leading to the Procedure:   ACS: No.  Non-ACS:    Anginal Classification: CCS IV  Anti-ischemic medical therapy: Minimal Therapy (1 class of medications)  Non-Invasive Test Results: No non-invasive testing performed  Prior CABG: No previous CABG  Laine Fonner

## 2017-10-16 NOTE — Discharge Instructions (Signed)
Radial Site Care °Refer to this sheet in the next few weeks. These instructions provide you with information about caring for yourself after your procedure. Your health care provider may also give you more specific instructions. Your treatment has been planned according to current medical practices, but problems sometimes occur. Call your health care provider if you have any problems or questions after your procedure. °What can I expect after the procedure? °After your procedure, it is typical to have the following: °· Bruising at the radial site that usually fades within 1-2 weeks. °· Blood collecting in the tissue (hematoma) that may be painful to the touch. It should usually decrease in size and tenderness within 1-2 weeks. ° °Follow these instructions at home: °· Take medicines only as directed by your health care provider. °· You may shower 24-48 hours after the procedure or as directed by your health care provider. Remove the bandage (dressing) and gently wash the site with plain soap and water. Pat the area dry with a clean towel. Do not rub the site, because this may cause bleeding. °· Do not take baths, swim, or use a hot tub until your health care provider approves. °· Check your insertion site every day for redness, swelling, or drainage. °· Do not apply powder or lotion to the site. °· Do not flex or bend the affected arm for 24 hours or as directed by your health care provider. °· Do not push or pull heavy objects with the affected arm for 24 hours or as directed by your health care provider. °· Do not lift over 10 lb (4.5 kg) for 5 days after your procedure or as directed by your health care provider. °· Ask your health care provider when it is okay to: °? Return to work or school. °? Resume usual physical activities or sports. °? Resume sexual activity. °· Do not drive home if you are discharged the same day as the procedure. Have someone else drive you. °· You may drive 24 hours after the procedure  unless otherwise instructed by your health care provider. °· Do not operate machinery or power tools for 24 hours after the procedure. °· If your procedure was done as an outpatient procedure, which means that you went home the same day as your procedure, a responsible adult should be with you for the first 24 hours after you arrive home. °· Keep all follow-up visits as directed by your health care provider. This is important. °Contact a health care provider if: °· You have a fever. °· You have chills. °· You have increased bleeding from the radial site. Hold pressure on the site. °Get help right away if: °· You have unusual pain at the radial site. °· You have redness, warmth, or swelling at the radial site. °· You have drainage (other than a small amount of blood on the dressing) from the radial site. °· The radial site is bleeding, and the bleeding does not stop after 30 minutes of holding steady pressure on the site. °· Your arm or hand becomes pale, cool, tingly, or numb. °This information is not intended to replace advice given to you by your health care provider. Make sure you discuss any questions you have with your health care provider. °Document Released: 01/29/2010 Document Revised: 06/04/2015 Document Reviewed: 07/15/2013 °Elsevier Interactive Patient Education © 2018 Elsevier Inc[employer] ° ° ° °Moderate Conscious Sedation, Adult, Care After °These instructions provide you with information about caring for yourself after your procedure. Your health care provider   may also give you more specific instructions. Your treatment has been planned according to current medical practices, but problems sometimes occur. Call your health care provider if you have any problems or questions after your procedure. °What can I expect after the procedure? °After your procedure, it is common: °· To feel sleepy for several hours. °· To feel clumsy and have poor balance for several hours. °· To have poor judgment for several  hours. °· To vomit if you eat too soon. ° °Follow these instructions at home: °For at least 24 hours after the procedure: ° °· Do not: °? Participate in activities where you could fall or become injured. °? Drive. °? Use heavy machinery. °? Drink alcohol. °? Take sleeping pills or medicines that cause drowsiness. °? Make important decisions or sign legal documents. °? Take care of children on your own. °· Rest. °Eating and drinking °· Follow the diet recommended by your health care provider. °· If you vomit: °? Drink water, juice, or soup when you can drink without vomiting. °? Make sure you have little or no nausea before eating solid foods. °General instructions °· Have a responsible adult stay with you until you are awake and alert. °· Take over-the-counter and prescription medicines only as told by your health care provider. °· If you smoke, do not smoke without supervision. °· Keep all follow-up visits as told by your health care provider. This is important. °Contact a health care provider if: °· You keep feeling nauseous or you keep vomiting. °· You feel light-headed. °· You develop a rash. °· You have a fever. °Get help right away if: °· You have trouble breathing. °This information is not intended to replace advice given to you by your health care provider. Make sure you discuss any questions you have with your health care provider. °Document Released: 10/17/2012 Document Revised: 06/01/2015 Document Reviewed: 04/18/2015 °Elsevier Interactive Patient Education © 2018 Elsevier Inc. ° °

## 2017-10-17 MED FILL — Heparin Sod (Porcine)-NaCl IV Soln 1000 Unit/500ML-0.9%: INTRAVENOUS | Qty: 1000 | Status: AC

## 2017-10-25 NOTE — Congregational Nurse Program (Signed)
  Dept: [phone]   Congregational Nurse Program Note  Date of Encounter: 10/25/2017  Past Medical History: Past Medical History:  Diagnosis Date  . Arthritis   . Asthma   . GERD (gastroesophageal reflux disease)   . History of hiatal hernia   . Hyperlipidemia   . Hypertension   . Mixed hyperlipidemia   . Neuropathy   . Prediabetes     Encounter Details: CNP Questionnaire - 10/25/17 1948      Questionnaire   Patient Status  Not Applicable    Race  White or Caucasian    Location Patient Served At  [employer]  Not Applicable    Uninsured  Uninsured (Subsequent visits/quarter)    Food  Yes, have food insecurities    Housing/Utilities  Yes, have permanent housing    Transportation  No transportation needs    Interpersonal Safety  Yes, feel physically and emotionally safe where you currently live    Medication  No medication insecurities    Medical Provider  Yes    Referrals  Area Agency    ED Visit Averted  Not Applicable    Life-Saving Intervention Made  Not Applicable      Client came by [facility] to pick up referral form for appointment today at [facility].   Pearletha Alfred  [phone].

## 2017-10-27 ENCOUNTER — Other Ambulatory Visit: Payer: Self-pay

## 2017-10-27 DIAGNOSIS — I739 Peripheral vascular disease, unspecified: Secondary | ICD-10-CM

## 2017-11-06 ENCOUNTER — Ambulatory Visit (HOSPITAL_COMMUNITY)
Admission: RE | Admit: 2017-11-06 | Discharge: 2017-11-06 | Disposition: A | Discharge status: Home / Self Care | Payer: Self-pay | Source: Ambulatory Visit | Attending: *Deleted | Admitting: *Deleted

## 2017-11-06 ENCOUNTER — Encounter: Payer: Self-pay | Admitting: Vascular Surgery

## 2017-11-06 ENCOUNTER — Ambulatory Visit (INDEPENDENT_AMBULATORY_CARE_PROVIDER_SITE_OTHER): Payer: Self-pay | Admitting: Vascular Surgery

## 2017-11-06 VITALS — BP 148/75 | HR 77 | Temp 96.0°F | Resp 22 | Ht 65.0 in | Wt 370.0 lb

## 2017-11-06 DIAGNOSIS — I739 Peripheral vascular disease, unspecified: Secondary | ICD-10-CM | POA: Insufficient documentation

## 2017-11-06 DIAGNOSIS — M7989 Other specified soft tissue disorders: Secondary | ICD-10-CM

## 2017-11-06 DIAGNOSIS — M79606 Pain in leg, unspecified: Secondary | ICD-10-CM

## 2017-11-06 NOTE — Progress Notes (Signed)
[facility] [place]  Patient name: John Cook MRN: [id number] DOB: 1970/08/28 Sex: male  REASON FOR CONSULT: Evaluation for possible peripheral artery occlusive disease  Seen today in our [place] office  HPI: John Cook is a 47 y.o. male, who is here today for valuation of potential lower extremity arterial insufficiency as a cause of pain and swelling.  He is a very pleasant morbidly obese male.  He has history of ulceration over his pretibial area on the right in May 2019 with prolonged healing requiring antibiotics.  He does have persistent swelling right greater than left.  No history of DVT.  No history of other tissue loss.  He does have diffuse discomfort in both lower extremities which he attributes to his back.  He is not able to elevate his legs due to obesity and difficulty with breathing.  Past Medical History:  Diagnosis Date  . Arthritis   . Asthma   . GERD (gastroesophageal reflux disease)   . History of hiatal hernia   . Hyperlipidemia   . Hypertension   . Mixed hyperlipidemia   . Neuropathy   . Prediabetes     Family History  Problem Relation Age of Onset  . Hypertension Mother   . COPD Mother   . Heart disease Mother   . Diabetes Mother   . Heart attack Father     SOCIAL HISTORY: Social History   Socioeconomic History  . Marital status: Single    Spouse name: Not on file  . Number of children: Not on file  . Years of education: Not on file  . Highest education level: Not on file  Occupational History  . Not on file  Social Needs  . Financial resource strain: Not on file  . Food insecurity:    Worry: Not on file    Inability: Not on file  . Transportation needs:    Medical: Not on file    Non-medical: Not on file  Tobacco Use  . Smoking status: Former Smoker    Types: Cigarettes    Last attempt to quit: 09/23/1995    Years since quitting: 22.1  . Smokeless tobacco: Never Used    Substance and Sexual Activity  . Alcohol use: Yes    Comment: occasional  . Drug use: No  . Sexual activity: Not on file  Lifestyle  . Physical activity:    Days per week: Not on file    Minutes per session: Not on file  . Stress: Not on file  Relationships  . Social connections:    Talks on phone: Not on file    Gets together: Not on file    Attends religious service: Not on file    Active member of club or organization: Not on file    Attends meetings of clubs or organizations: Not on file    Relationship status: Not on file  . Intimate partner violence:    Fear of current or ex partner: Not on file    Emotionally abused: Not on file    Physically abused: Not on file    Forced sexual activity: Not on file  Other Topics Concern  . Not on file  Social History Narrative  . Not on file    Allergies  Allergen Reactions  . Lisinopril Cough    Current Outpatient Medications  Medication Sig Dispense Refill  . metFORMIN (GLUCOPHAGE) 500 MG tablet Take 500 mg by mouth 2 (two) times daily with a  meal.    . atenolol (TENORMIN) 25 MG tablet Take 25 mg by mouth daily.    Marland Kitchen gabapentin (NEURONTIN) 300 MG capsule Take 300 mg by mouth 2 (two) times daily.    Marland Kitchen omeprazole (PRILOSEC) 20 MG capsule Take 20 mg by mouth daily.    Marland Kitchen oxybutynin (DITROPAN) 5 MG tablet Take 5 mg by mouth daily as needed for bladder spasms.    Marland Kitchen PROVENTIL HFA 108 (90 Base) MCG/ACT inhaler Inhale 2 puffs into the lungs every 4 (four) hours as needed for shortness of breath.  0  . triamterene-hydrochlorothiazide (MAXZIDE-25) 37.5-25 MG tablet Take 1 tablet by mouth daily.     No current facility-administered medications for this visit.     REVIEW OF SYSTEMS:  [X]  denotes positive finding, [ ]  denotes negative finding Cardiac  Comments:  Chest pain or chest pressure: x   Shortness of breath upon exertion: x   Short of breath when lying flat: x   Irregular heart rhythm:        Vascular    Pain in calf,  thigh, or hip brought on by ambulation: x   Pain in feet at night that wakes you up from your sleep:     Blood clot in your veins:    Leg swelling:  x       Pulmonary    Oxygen at home:    Productive cough:     Wheezing:  x       Neurologic    Sudden weakness in arms or legs:  x   Sudden numbness in arms or legs:  x   Sudden onset of difficulty speaking or slurred speech:    Temporary loss of vision in one eye:     Problems with dizziness:         Gastrointestinal    Blood in stool:     Vomited blood:         Genitourinary    Burning when urinating:     Blood in urine:        Psychiatric    Major depression:         Hematologic    Bleeding problems:    Problems with blood clotting too easily:        Skin    Rashes or ulcers: x       Constitutional    Fever or chills:      PHYSICAL EXAM: Vitals:   11/06/17 1415  BP: (!) 148/75  Pulse: 77  Resp: (!) 22  Temp: (!) 96 F (35.6 C)  TempSrc: Temporal  Weight: (!) 370 lb (167.8 kg)  Height: 5\' 5"  (1.651 m)    GENERAL: The patient is a well-nourished male, in no acute distress. The vital signs are documented above. CARDIOVASCULAR: Carotid arteries are without bruits.  He has plus radial pulses bilaterally.  He has 2+ dorsalis pedis pulses bilaterally PULMONARY: There is good air exchange  ABDOMEN: Soft and non-tender.  Obese no masses noted MUSCULOSKELETAL: There are no major deformities or cyanosis. NEUROLOGIC: No focal weakness or paresthesias are detected. SKIN: There are no ulcers or rashes noted.  Does have some hemosiderin deposit more so on the right and on the left with some erythema potentially from swelling.  He does have pitting edema bilateral PSYCHIATRIC: The patient has a normal affect.  DATA:  Noninvasive lower [place] arterial studies reveal normal ankle arm index and normal waveforms bilaterally  MEDICAL ISSUES: Because these findings with the patient.  I do not see any evidence of arterial  insufficiency.  I feel that his leg swelling is what caused him to have difficulty with healing the ulceration pretibially in May 2019.  He will attempt to elevate when possible with this is difficult day to his obesity and difficulty with breathing.  Also difficult for him to place compression garments but I did implant explain the importance of this to him.  He was reassured with this discussion will see [place] again on an as-needed basis   Larina Earthly, MD [facility] Tel [phone] Pager [phone]

## 2017-11-17 ENCOUNTER — Encounter: Payer: Self-pay | Admitting: Pulmonary Disease

## 2017-11-17 ENCOUNTER — Ambulatory Visit (INDEPENDENT_AMBULATORY_CARE_PROVIDER_SITE_OTHER): Payer: Self-pay | Admitting: Pulmonary Disease

## 2017-11-17 ENCOUNTER — Ambulatory Visit (INDEPENDENT_AMBULATORY_CARE_PROVIDER_SITE_OTHER)
Admission: RE | Admit: 2017-11-17 | Discharge: 2017-11-17 | Disposition: A | Discharge status: Home / Self Care | Payer: Self-pay | Source: Ambulatory Visit | Attending: Pulmonary Disease | Admitting: Pulmonary Disease

## 2017-11-17 VITALS — BP 162/90 | HR 72 | Ht 65.0 in | Wt 367.0 lb

## 2017-11-17 DIAGNOSIS — R0602 Shortness of breath: Secondary | ICD-10-CM

## 2017-11-17 DIAGNOSIS — Z87898 Personal history of other specified conditions: Secondary | ICD-10-CM | POA: Insufficient documentation

## 2017-11-17 DIAGNOSIS — R0609 Other forms of dyspnea: Secondary | ICD-10-CM | POA: Insufficient documentation

## 2017-11-17 DIAGNOSIS — G4719 Other hypersomnia: Secondary | ICD-10-CM

## 2017-11-17 NOTE — Patient Instructions (Addendum)
Home sleep study  Obtain a pulmonary function study  Obtain a chest x-ray  High probability of significant sleep disordered breathing Patient with morbid obesity, shortness of breath with activity  I will see her back in the office in about 6 weeks  Call with any significant concerns

## 2017-11-17 NOTE — Progress Notes (Signed)
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 atenolol (TENORMIN) 25 MG tablet Take 25 mg by mouth daily.  Marland Kitchen gabapentin (NEURONTIN) 300 MG capsule Take 300 mg by mouth 2 (two) times daily.  . metFORMIN (GLUCOPHAGE) 500 MG tablet Take 500 mg by mouth 2 (two) times daily with a meal.  . omeprazole (PRILOSEC) 20 MG capsule Take 20 mg by mouth daily.  Marland Kitchen oxybutynin (DITROPAN) 5 MG tablet Take 5 mg by mouth daily as needed for bladder spasms.  Marland Kitchen PROVENTIL HFA 108 (90 Base) MCG/ACT inhaler Inhale 2 puffs into the lungs every 4 (four) hours as needed for shortness of breath.  . triamterene-hydrochlorothiazide (MAXZIDE-25) 37.5-25 MG tablet Take 1 tablet by mouth daily.   No facility-administered encounter medications on file as of 11/17/2017.     Allergies as of 11/17/2017 - Review Complete 11/17/2017  Allergen Reaction Noted  . Lisinopril Cough 03/30/2011    Past Medical History:  Diagnosis Date  . Arthritis   . Asthma   . GERD (gastroesophageal reflux disease)   . History of hiatal hernia   . Hyperlipidemia   . Hypertension   . Mixed hyperlipidemia   . Neuropathy   . Prediabetes     Past Surgical History:  Procedure  Laterality Date  . CATARACT EXTRACTION Bilateral    with lens implant  . INCISION AND DRAINAGE ABSCESS Left 09/24/2015   Procedure: INCISION AND DRAINAGE OF LEFT NECK ABSCESS;  Surgeon: Chevis Pretty III, MD;  Location: [facility] OR;  Service: General;  Laterality: Left;  . LEFT HEART CATH AND CORONARY ANGIOGRAPHY N/A 10/16/2017   Procedure: LEFT HEART CATH AND CORONARY ANGIOGRAPHY;  Surgeon: Yvonne Kendall, MD;  Location: [facility] INVASIVE CV LAB;  Service: Cardiovascular;  Laterality: N/A;    Family History  Problem Relation Age of Onset  . Hypertension Mother   . COPD Mother   . Heart disease Mother   . Diabetes Mother   . Heart attack Father     Social History   Socioeconomic History  . Marital status: Single    Spouse name: Not on file  . Number of children: Not on file  . Years of education: Not on file  . Highest education level: Not on file  Occupational History  . Not on file  Social Needs  . Financial resource strain: Not on file  . Food insecurity:    Worry: Not on file    Inability: Not on file  . Transportation needs:    Medical: Not on file    Non-medical: Not on file  Tobacco Use  . Smoking status: Former Smoker    Types: Cigarettes  Last attempt to quit: 09/23/1995    Years since quitting: 22.1  . Smokeless tobacco: Never Used  Substance and Sexual Activity  . Alcohol use: Yes    Comment: occasional  . Drug use: No  . Sexual activity: Not on file  Lifestyle  . Physical activity:    Days per week: Not on file    Minutes per session: Not on file  . Stress: Not on file  Relationships  . Social connections:    Talks on phone: Not on file    Gets together: Not on file    Attends religious service: Not on file    Active member of club or organization: Not on file    Attends meetings of clubs or organizations: Not on file    Relationship status: Not on file  . Intimate partner violence:    Fear of current or ex partner: Not on file    Emotionally abused: Not on  file    Physically abused: Not on file    Forced sexual activity: Not on file  Other Topics Concern  . Not on file  Social History Narrative  . Not on file    Review of Systems  Constitutional: Positive for fatigue.  HENT: Negative for nosebleeds.   Eyes: Negative.   Respiratory: Positive for shortness of breath.   Cardiovascular: Negative for chest pain.  Gastrointestinal: Negative.   Endocrine: Negative.   Genitourinary: Negative.   Psychiatric/Behavioral: Positive for sleep disturbance.    Vitals:   11/17/17 1141  BP: (!) 162/90  Pulse: 72  SpO2: 94%     Physical Exam  Constitutional:  Morbid obesity  HENT:  Head: Normocephalic and atraumatic.  Crowded oropharynx  Eyes: Right eye exhibits no discharge. Left eye exhibits no discharge.  Neck: Normal range of motion. Neck supple. No tracheal deviation present. No thyromegaly present.  Cardiovascular: Normal rate and regular rhythm.  Pulmonary/Chest: Effort normal and breath sounds normal. No respiratory distress. He has no wheezes.  Abdominal: Soft. Bowel sounds are normal. He exhibits distension. There is no tenderness.   Assessment:  High probability of significant sleep disordered breathing  Morbid obesity  Shortness of breath with activity  May have hypoventilation syndrome secondary to his morbid obesity  Plan/Recommendations:  We will order a home sleep study  Obtain a chest x-ray Obtain pulmonary function study  Pathophysiology of sleep disordered breathing discussed with the patient Treatment options for sleep disordered breathing discussed  Importance of weight loss was discussed His morbid obesity would however continue to limit him unless he is able to get some weight off   Virl Diamond MD [facility] 11/17/2017, 12:02 PM  CC: Kizzie Furnish D., PA-C

## 2017-11-22 NOTE — Progress Notes (Signed)
LMTCB

## 2017-11-23 ENCOUNTER — Telehealth: Payer: Self-pay | Admitting: Pulmonary Disease

## 2017-11-23 NOTE — Telephone Encounter (Signed)
Notes recorded by Tomma Lightninglalere, Adewale A, MD on 11/21/2017 at 7:33 AM EST CXR   no acute changes Routine follow-up as scheduled  Spoke with pt and notified of results per Dr. Wynona Neatlalere. Pt verbalized understanding and denied any questions.

## 2017-11-30 ENCOUNTER — Telehealth: Payer: Self-pay | Admitting: Pulmonary Disease

## 2017-11-30 NOTE — Telephone Encounter (Signed)
Mr. John Cook is scheduled to pick up HST machine on 12/04/2017 @ 10:00am and he is aware of appt and the new location

## 2017-11-30 NOTE — Telephone Encounter (Signed)
Called and spoke to pt.  Pt states he received a call from our office this morning, however he is unsure who called.  I do not see documentation of our office attempting to contact pt.  I do see that home sleep test was ordered at last office visit.  Pt stated that he has not yet scheduled this test.    PCC's please advise if you guys attempting to contact pt. Thanks

## 2017-12-04 DIAGNOSIS — G4733 Obstructive sleep apnea (adult) (pediatric): Secondary | ICD-10-CM

## 2017-12-06 ENCOUNTER — Other Ambulatory Visit: Payer: Self-pay | Admitting: *Deleted

## 2017-12-06 DIAGNOSIS — R0602 Shortness of breath: Secondary | ICD-10-CM

## 2017-12-14 DIAGNOSIS — G4733 Obstructive sleep apnea (adult) (pediatric): Secondary | ICD-10-CM

## 2017-12-18 ENCOUNTER — Telehealth: Payer: Self-pay | Admitting: Pulmonary Disease

## 2017-12-19 NOTE — Telephone Encounter (Signed)
Dr. Wynona Neatlalere has reviewed the home sleep test this showed Moderate Obstructive sleep apnea with severe oxygen desaturation.   Recommendations   Treatment options are CPAP with the settings auto 5 to 15.    Weight loss measures .   Advise against driving while sleepy & against medication with sedative side effects.    Make appointment for 3 months for compliance with download with Dr. Wynona Neatlalere.    Patient stated that he would like to wait to start therapy due to not having insurance or having money for asst.  program.

## 2017-12-20 ENCOUNTER — Encounter (HOSPITAL_COMMUNITY): Payer: Self-pay | Admitting: Emergency Medicine

## 2017-12-20 ENCOUNTER — Emergency Department (HOSPITAL_COMMUNITY)
Admission: EM | Admit: 2017-12-20 | Discharge: 2017-12-20 | Disposition: A | Discharge status: Home / Self Care | Payer: Self-pay | Attending: Emergency Medicine | Admitting: Emergency Medicine

## 2017-12-20 ENCOUNTER — Other Ambulatory Visit: Payer: Self-pay

## 2017-12-20 DIAGNOSIS — I10 Essential (primary) hypertension: Secondary | ICD-10-CM | POA: Insufficient documentation

## 2017-12-20 DIAGNOSIS — Z7984 Long term (current) use of oral hypoglycemic drugs: Secondary | ICD-10-CM | POA: Insufficient documentation

## 2017-12-20 DIAGNOSIS — J45909 Unspecified asthma, uncomplicated: Secondary | ICD-10-CM | POA: Insufficient documentation

## 2017-12-20 DIAGNOSIS — Y999 Unspecified external cause status: Secondary | ICD-10-CM | POA: Insufficient documentation

## 2017-12-20 DIAGNOSIS — Y9389 Activity, other specified: Secondary | ICD-10-CM | POA: Insufficient documentation

## 2017-12-20 DIAGNOSIS — X58XXXA Exposure to other specified factors, initial encounter: Secondary | ICD-10-CM | POA: Insufficient documentation

## 2017-12-20 DIAGNOSIS — Z87891 Personal history of nicotine dependence: Secondary | ICD-10-CM | POA: Insufficient documentation

## 2017-12-20 DIAGNOSIS — Y929 Unspecified place or not applicable: Secondary | ICD-10-CM | POA: Insufficient documentation

## 2017-12-20 DIAGNOSIS — Z79899 Other long term (current) drug therapy: Secondary | ICD-10-CM | POA: Insufficient documentation

## 2017-12-20 DIAGNOSIS — T162XXA Foreign body in left ear, initial encounter: Secondary | ICD-10-CM | POA: Insufficient documentation

## 2017-12-20 HISTORY — DX: Sleep apnea, unspecified: G47.30

## 2017-12-20 NOTE — Discharge Instructions (Addendum)
Avoid placing qtips inside your ear canal!

## 2017-12-20 NOTE — ED Provider Notes (Signed)
[facility] Provider Note   CSN: [id number] Arrival date & time: 12/20/17  1454     History   Chief Complaint Chief Complaint  Patient presents with  . Foreign Body in Ear    HPI John Cook is a 47 y.o. male presenting with a qtip cotton in his left ear x 5 days.  Reports reduced hearing acuity on that side.  Denies pain or drainage, no fever or other complaint.  He has tried flushing it out with no success.  Was seen at his pcp with the health dept today but was sent here for definitive tx.  The history is provided by the patient.    Past Medical History:  Diagnosis Date  . Arthritis   . Asthma   . GERD (gastroesophageal reflux disease)   . History of hiatal hernia   . Hyperlipidemia   . Hypertension   . Mixed hyperlipidemia   . Neuropathy   . Prediabetes   . Sleep apnea     Patient Active Problem List   Diagnosis Date Noted  . SOB (shortness of breath) 11/17/2017  . Excessive daytime sleepiness 11/17/2017  . History of snoring 11/17/2017  . 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 CATARACT EXTRACTION Bilateral    with lens implant  . INCISION AND DRAINAGE ABSCESS Left 09/24/2015   Procedure: INCISION AND DRAINAGE OF LEFT NECK ABSCESS;  Surgeon: Chevis PrettyPaul Toth III, MD;  Location: [facility] OR;  Service: General;  Laterality: Left;  . LEFT HEART CATH AND CORONARY ANGIOGRAPHY N/A 10/16/2017   Procedure: LEFT HEART CATH AND CORONARY ANGIOGRAPHY;  Surgeon: Yvonne KendallEnd, Christopher, MD;  Location: [facility] INVASIVE CV LAB;  Service: Cardiovascular;  Laterality: N/A;        Home Medications    Prior to Admission medications   Medication Sig Start Date End Date Taking? Authorizing Provider  atenolol (TENORMIN) 25 MG tablet Take 25 mg by mouth daily.    [provider]  gabapentin (NEURONTIN) 300 MG capsule Take 300 mg by mouth 2 (two) times daily.    [provider]  metFORMIN (GLUCOPHAGE) 500 MG  tablet Take 500 mg by mouth 2 (two) times daily with a meal.    [provider]  omeprazole (PRILOSEC) 20 MG capsule Take 20 mg by mouth daily.    [provider]  oxybutynin (DITROPAN) 5 MG tablet Take 5 mg by mouth daily as needed for bladder spasms.    [provider]  PROVENTIL HFA 108 (90 Base) MCG/ACT inhaler Inhale 2 puffs into the lungs every 4 (four) hours as needed for shortness of breath. 10/02/17   [provider]  triamterene-hydrochlorothiazide (MAXZIDE-25) 37.5-25 MG tablet Take 1 tablet by mouth daily.    [provider]    Family History Family History  Problem Relation Age of Onset  . Hypertension Mother   . COPD Mother   . Heart disease Mother   . Diabetes Mother   . Heart attack Father     Social History Social History   Tobacco Use  . Smoking status: Former Smoker    Types: Cigarettes    Last attempt to quit: 09/23/1995    Years since quitting: 22.2  . Smokeless tobacco: Never Used  Substance Use Topics  . Alcohol use: Yes    Comment: occasional  . Drug use: No     Allergies   Lisinopril   Review of Systems Review of Systems  Constitutional: Negative for chills and fever.  HENT: Positive for hearing loss. Negative for congestion, ear discharge, ear pain, rhinorrhea, sinus pressure, sore throat, trouble swallowing and voice change.   Eyes: Negative for discharge.  Respiratory: Negative for cough, shortness of breath, wheezing and stridor.   Cardiovascular: Negative for chest pain.  Genitourinary: Negative.   Neurological: Negative for dizziness.     Physical Exam Updated Vital Signs BP 109/66 (BP Location: Right Arm)   Pulse 72   Temp 98 F (36.7 C) (Oral)   Resp 18   Ht 5\' 5"  (1.651 m)   Wt (!) 160.6 kg   SpO2 93%   BMI 58.91 kg/m   Physical Exam  Constitutional: He is oriented to person, place, and time. He appears well-developed and well-nourished.  HENT:  Head: Normocephalic and  atraumatic.  Right Ear: Tympanic membrane and ear canal normal.  Left Ear: A foreign body is present.  Nose: Mucosal edema and rhinorrhea present.  Mouth/Throat: Uvula is midline, oropharynx is clear and moist and mucous membranes are normal. No oropharyngeal exudate, posterior oropharyngeal edema, posterior oropharyngeal erythema or tonsillar abscesses.  Cotton deep within the left ear canal.  Eyes: Conjunctivae are normal.  Cardiovascular: Normal rate and normal heart sounds.  Pulmonary/Chest: Effort normal.  Musculoskeletal: Normal range of motion.  Neurological: He is alert and oriented to person, place, and time.  Skin: Skin is warm and dry. No rash noted.  Psychiatric: He has a normal mood and affect.     ED Treatments / Results  Labs (all labs ordered are listed, but only abnormal results are displayed) Labs Reviewed - No data to display  EKG None  Radiology No results found.  Procedures .Foreign Body Removal Date/Time: 12/20/2017 4:10 PM Performed by: Burgess Amor, PA-C Authorized by: Burgess Amor, PA-C  Consent: Verbal consent obtained. Consent given by: patient Patient understanding: patient states understanding of the procedure being performed Patient consent: the patient's understanding of the procedure matches consent given Patient identity confirmed: verbally with patient Time out: Immediately prior to procedure a "time out" was called to verify the correct patient, procedure, equipment, support staff and site/side marked as required. Body area: ear Location details: left ear  Anesthesia: Local anesthetic: none.  Sedation: Patient sedated: no  Localization method: ENT speculum Removal mechanism: alligator forceps Complexity: simple Objects recovered: cotton tip Post-procedure assessment: foreign body removed Patient tolerance: Patient tolerated the procedure well with no immediate complications Comments: Ear canal without trauma, TM intact.    (including critical care time)  Medications Ordered in ED Medications - No data to display   Initial Impression / Assessment and Plan / ED Course  I have reviewed the triage vital signs and the nursing notes.  Pertinent labs & imaging results that were available during my care of the patient were reviewed by me and considered in my medical decision making (see chart for details).     Prn f/u anticipated  Final Clinical Impressions(s) / ED Diagnoses   Final diagnoses:  Foreign body of left ear, initial encounter    ED Discharge Orders    None       Victoriano Lain 12/20/17 1751    Jacalyn Lefevre, MD 12/20/17 [phone]

## 2017-12-20 NOTE — Congregational Nurse Program (Signed)
Client was seen today for a referral for appt. At [facility].  Client states has a part of a q tip in his ear, while trying to clean his ear. Denies pain. Appt scheduled today 1:30.  Also referral form for appt 01/26/18 for followup diabetes.   Pearletha AlfredJan Senia Even,RN [phone].

## 2017-12-20 NOTE — ED Triage Notes (Signed)
Patient states he has a q-tip in his left ear since Dec 6.

## 2018-01-08 ENCOUNTER — Ambulatory Visit: Payer: Self-pay | Admitting: Pulmonary Disease

## 2018-02-16 ENCOUNTER — Other Ambulatory Visit: Payer: Self-pay

## 2018-02-16 ENCOUNTER — Emergency Department (HOSPITAL_COMMUNITY)
Admission: EM | Admit: 2018-02-16 | Discharge: 2018-02-16 | Disposition: A | Discharge status: Home / Self Care | Payer: Self-pay | Attending: Emergency Medicine | Admitting: Emergency Medicine

## 2018-02-16 ENCOUNTER — Encounter (HOSPITAL_COMMUNITY): Payer: Self-pay | Admitting: Emergency Medicine

## 2018-02-16 DIAGNOSIS — M545 Low back pain, unspecified: Secondary | ICD-10-CM

## 2018-02-16 DIAGNOSIS — J45909 Unspecified asthma, uncomplicated: Secondary | ICD-10-CM | POA: Insufficient documentation

## 2018-02-16 DIAGNOSIS — Z79899 Other long term (current) drug therapy: Secondary | ICD-10-CM | POA: Insufficient documentation

## 2018-02-16 DIAGNOSIS — Z7984 Long term (current) use of oral hypoglycemic drugs: Secondary | ICD-10-CM | POA: Insufficient documentation

## 2018-02-16 DIAGNOSIS — Z87891 Personal history of nicotine dependence: Secondary | ICD-10-CM | POA: Insufficient documentation

## 2018-02-16 DIAGNOSIS — I10 Essential (primary) hypertension: Secondary | ICD-10-CM | POA: Insufficient documentation

## 2018-02-16 DIAGNOSIS — E119 Type 2 diabetes mellitus without complications: Secondary | ICD-10-CM | POA: Insufficient documentation

## 2018-02-16 HISTORY — DX: Type 2 diabetes mellitus without complications: E11.9

## 2018-02-16 MED ORDER — TRAMADOL HCL 50 MG PO TABS: 50.0000 mg | ORAL_TABLET | Freq: Four times a day (QID) | ORAL | 0 refills | Status: DC | PRN

## 2018-02-16 MED ORDER — CYCLOBENZAPRINE HCL 10 MG PO TABS: 10.0000 mg | ORAL_TABLET | Freq: Three times a day (TID) | ORAL | 0 refills | Status: DC | PRN

## 2018-02-16 NOTE — Discharge Instructions (Addendum)
Apply ice packs on and off to your back.  Try to avoid twisting movements for at least 1 week.  Take the medication as directed.  Follow-up with your primary doctor for recheck return to ER for any worsening symptoms.

## 2018-02-16 NOTE — ED Triage Notes (Signed)
Back pain after twisting on Tuesday morning.

## 2018-02-17 NOTE — ED Provider Notes (Signed)
[facility] EMERGENCY DEPARTMENT Provider Note   CSN: [id number] Arrival date & time: 02/16/18  1409     History   Chief Complaint Chief Complaint  Patient presents with  . Back Pain    HPI John Cook is a 48 y.o. male.  HPI   John Cook is a 48 y.o. male who presents to the Emergency Department complaining of intermittent low back pain for 3 days.  He noticed intermittent sharp "stabs" to his lower back after twisting to wipe while sitting on the toilet.  Since then he has brief, stabs of pain with twisting movements.  No pain at rest.  He denies pain, numbness or weakness of the lower extremities, abd pain, urine or bowel changes, and fever.    Past Medical History:  Diagnosis Date  . Arthritis   . Asthma   . Diabetes mellitus without complication (HCC)   . GERD (gastroesophageal reflux disease)   . History of hiatal hernia   . Hyperlipidemia   . Hypertension   . Mixed hyperlipidemia   . Neuropathy   . Prediabetes   . Sleep apnea     Patient Active Problem List   Diagnosis Date Noted  . SOB (shortness of breath) 11/17/2017  . Excessive daytime sleepiness 11/17/2017  . History of snoring 11/17/2017  . 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 CATARACT EXTRACTION Bilateral    with lens implant  . INCISION AND DRAINAGE ABSCESS Left 09/24/2015   Procedure: INCISION AND DRAINAGE OF LEFT NECK ABSCESS;  Surgeon: Chevis Pretty III, MD;  Location: [facility] OR;  Service: General;  Laterality: Left;  . LEFT HEART CATH AND CORONARY ANGIOGRAPHY N/A 10/16/2017   Procedure: LEFT HEART CATH AND CORONARY ANGIOGRAPHY;  Surgeon: Yvonne Kendall, MD;  Location: [facility] INVASIVE CV LAB;  Service: Cardiovascular;  Laterality: N/A;        Home Medications    Prior to Admission medications   Medication Sig Start Date End Date Taking? Authorizing Provider  acetaminophen (TYLENOL) 500 MG tablet Take 500 mg by mouth every 6 (six) hours as needed  for mild pain or moderate pain.   Yes [provider]  atenolol (TENORMIN) 25 MG tablet Take 25 mg by mouth daily.   Yes [provider]  gabapentin (NEURONTIN) 300 MG capsule Take 300 mg by mouth 2 (two) times daily.   Yes [provider]  metFORMIN (GLUCOPHAGE) 500 MG tablet Take 500 mg by mouth 2 (two) times daily with a meal.   Yes [provider]  omeprazole (PRILOSEC) 20 MG capsule Take 20 mg by mouth daily.   Yes [provider]  oxybutynin (DITROPAN) 5 MG tablet Take 5 mg by mouth daily as needed for bladder spasms.   Yes [provider]  PROVENTIL HFA 108 (90 Base) MCG/ACT inhaler Inhale 2 puffs into the lungs every 4 (four) hours as needed for shortness of breath. 10/02/17  Yes [provider]  triamterene-hydrochlorothiazide (MAXZIDE-25) 37.5-25 MG tablet Take 1 tablet by mouth daily.   Yes [provider]  cyclobenzaprine (FLEXERIL) 10 MG tablet Take 1 tablet (10 mg total) by mouth 3 (three) times daily as needed. 02/16/18   Anisia Leija, PA-C  traMADol (ULTRAM) 50 MG tablet Take 1 tablet (50 mg total) by mouth every 6 (six) hours as needed. 02/16/18   Pauline Aus, PA-C    Family History Family History  Problem Relation Age of Onset  . Hypertension  Mother   . COPD Mother   . Heart disease Mother   . Diabetes Mother   . Heart attack Father     Social History Social History   Tobacco Use  . Smoking status: Former Smoker    Types: Cigarettes    Last attempt to quit: 09/23/1995    Years since quitting: 22.4  . Smokeless tobacco: Never Used  Substance Use Topics  . Alcohol use: Yes    Comment: occasional  . Drug use: No     Allergies   Lisinopril   Review of Systems Review of Systems  Constitutional: Negative for fever.  Respiratory: Negative for shortness of breath.   Gastrointestinal: Negative for abdominal pain, constipation and vomiting.  Genitourinary: Negative for decreased urine  volume, difficulty urinating, dysuria, flank pain and hematuria.  Musculoskeletal: Positive for back pain. Negative for joint swelling.  Skin: Negative for rash.  Neurological: Negative for weakness and numbness.  All other systems reviewed and are negative.    Physical Exam Updated Vital Signs BP 132/74 (BP Location: Right Arm)   Pulse 74   Temp 98.1 F (36.7 C) (Oral)   Resp 18   Ht 5\' 5"  (1.651 m)   Wt (!) 163.3 kg   SpO2 95%   BMI 59.91 kg/m   Physical Exam Vitals signs and nursing note reviewed.  Constitutional:      General: He is not in acute distress.    Appearance: He is well-developed.  HENT:     Head: Normocephalic and atraumatic.  Neck:     Musculoskeletal: Normal range of motion and neck supple.  Cardiovascular:     Rate and Rhythm: Normal rate and regular rhythm.     Comments: DP pulses are strong and palpable bilaterally Pulmonary:     Effort: Pulmonary effort is normal. No respiratory distress.     Breath sounds: Normal breath sounds.  Abdominal:     General: There is no distension.     Palpations: Abdomen is soft.     Tenderness: There is no abdominal tenderness.  Musculoskeletal:        General: Tenderness present.     Lumbar back: He exhibits tenderness and pain. He exhibits normal range of motion, no swelling, no deformity, no laceration and normal pulse.     Right lower leg: No edema.     Left lower leg: No edema.     Comments: Midline tenderness of lower lumbar spine.  No bony step offs.  Pt has 5/5 strength against resistance of bilateral lower extremities.  Hip flexors and extensors intact   Skin:    General: Skin is warm.     Capillary Refill: Capillary refill takes less than 2 seconds.     Findings: No rash.  Neurological:     General: No focal deficit present.     Mental Status: He is alert and oriented to person, place, and time. Mental status is at baseline.     Sensory: No sensory deficit.     Motor: No weakness or abnormal muscle  tone.     Gait: Gait normal.     Deep Tendon Reflexes:     Reflex Scores:      Patellar reflexes are 2+ on the right side and 2+ on the left side.      Achilles reflexes are 2+ on the right side and 2+ on the left side.     ED Treatments / Results  Labs (all labs ordered are listed, but only abnormal  results are displayed) Labs Reviewed - No data to display  EKG None  Radiology No results found.  Procedures Procedures (including critical care time)  Medications Ordered in ED Medications - No data to display   Initial Impression / Assessment and Plan / ED Course  I have reviewed the triage vital signs and the nursing notes.  Pertinent labs & imaging results that were available during my care of the patient were reviewed by me and considered in my medical decision making (see chart for details).     Pt with intermittent episodes of low back pain with certain movements.  No pain at rest.  No hx of injury, fever, or neuro deficits on exam to indicate need for imaging.  Doubt cauda equina or infectious process, likely musculoskeletal.  He agrees to tx plan and close out pt f/u.  Return precautions discussed.   Final Clinical Impressions(s) / ED Diagnoses   Final diagnoses:  Acute midline low back pain without sciatica    ED Discharge Orders         Ordered    cyclobenzaprine (FLEXERIL) 10 MG tablet  3 times daily PRN     02/16/18 1704    traMADol (ULTRAM) 50 MG tablet  Every 6 hours PRN     02/16/18 1704           Pauline Ausriplett, Ayaan Shutes, PA-C 02/17/18 2334    Vanetta MuldersZackowski, Scott, MD 02/27/18 [phone]

## 2018-03-20 NOTE — Congregational Nurse Program (Signed)
  Dept: [phone]   Congregational Nurse Program Note  Date of Encounter: 03/20/2018  Past Medical History: Past Medical History:  Diagnosis Date  . Arthritis   . Asthma   . Diabetes mellitus without complication (HCC)   . GERD (gastroesophageal reflux disease)   . History of hiatal hernia   . Hyperlipidemia   . Hypertension   . Mixed hyperlipidemia   . Neuropathy   . Prediabetes   . Sleep apnea     Encounter Details: CNP Questionnaire - 03/20/18 1145      Questionnaire   Patient Status  Not Applicable    Race  White or Caucasian    Location Patient Served At  [facility]  Not Applicable   [facility]   Uninsured  Not Applicable    Food  Yes, have food insecurities    Housing/Utilities  Yes, have permanent housing    Transportation  No transportation needs    Interpersonal Safety  Yes, feel physically and emotionally safe where you currently live    Medication  No medication insecurities    Medical Provider  Yes    Referrals  Not Applicable    ED Visit Averted  Not Applicable    Life-Saving Intervention Made  Not Applicable     03/20/2018 want B/P and Blood Sugar checked.  Blood pressure 121/78 pulse 83, Blood Sugar 117. Takes medication as ordered by doctor.  Cecilie Kicks, RN  [phone]

## 2018-08-30 ENCOUNTER — Telehealth: Payer: Self-pay

## 2018-08-30 NOTE — Telephone Encounter (Signed)
Called client to confirm scheduled appointment at [facility] 10/10/18 9:30.   Discussed current health conditions. Client is established under [place] [facility] [place].   Encouraged to call if any changes in appointment or any questions or concerns.   Tarri Fuller  [phone].

## 2018-11-21 ENCOUNTER — Telehealth: Payer: Self-pay

## 2018-12-13 IMAGING — DX DG CHEST 2V
2 series · 2 of 2 positions shown · non-contrast
Comparison: 04/25/2017

CLINICAL DATA: Chronic dyspnea and left-sided chest pain.

EXAM:
CHEST - 2 VIEW

[chest pa]
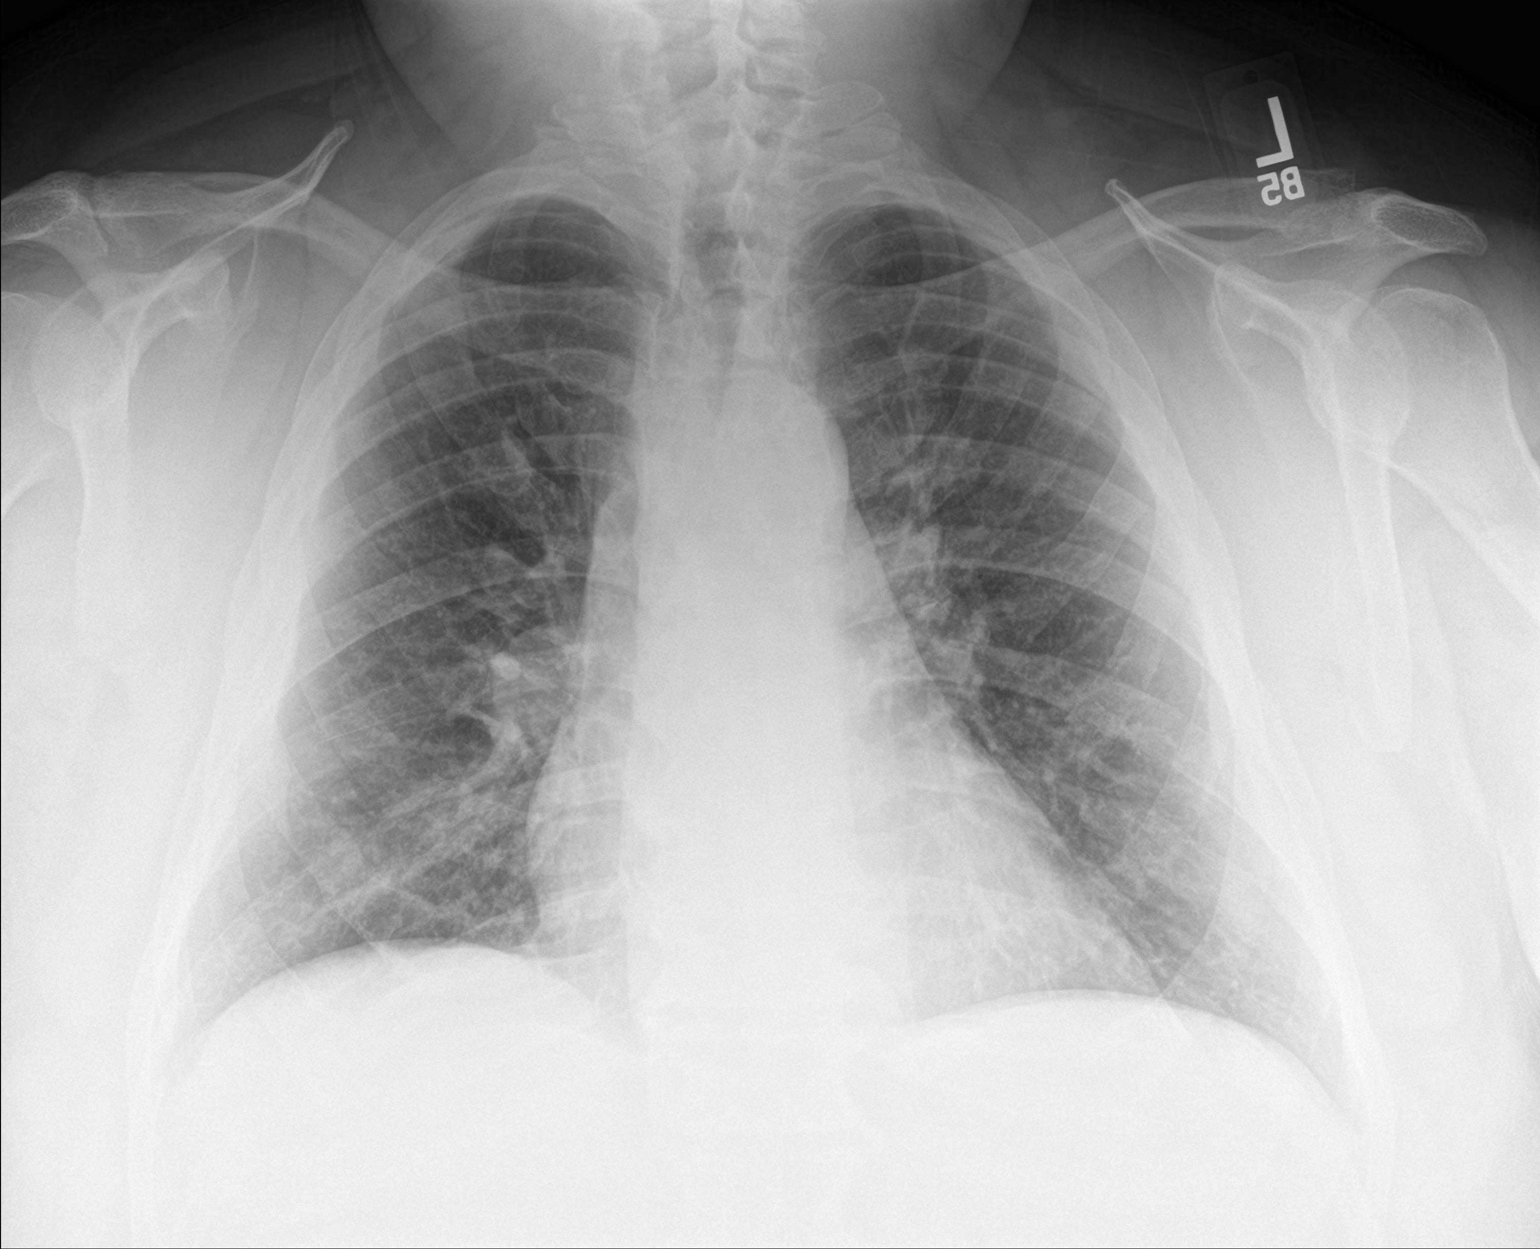

[chest lat]
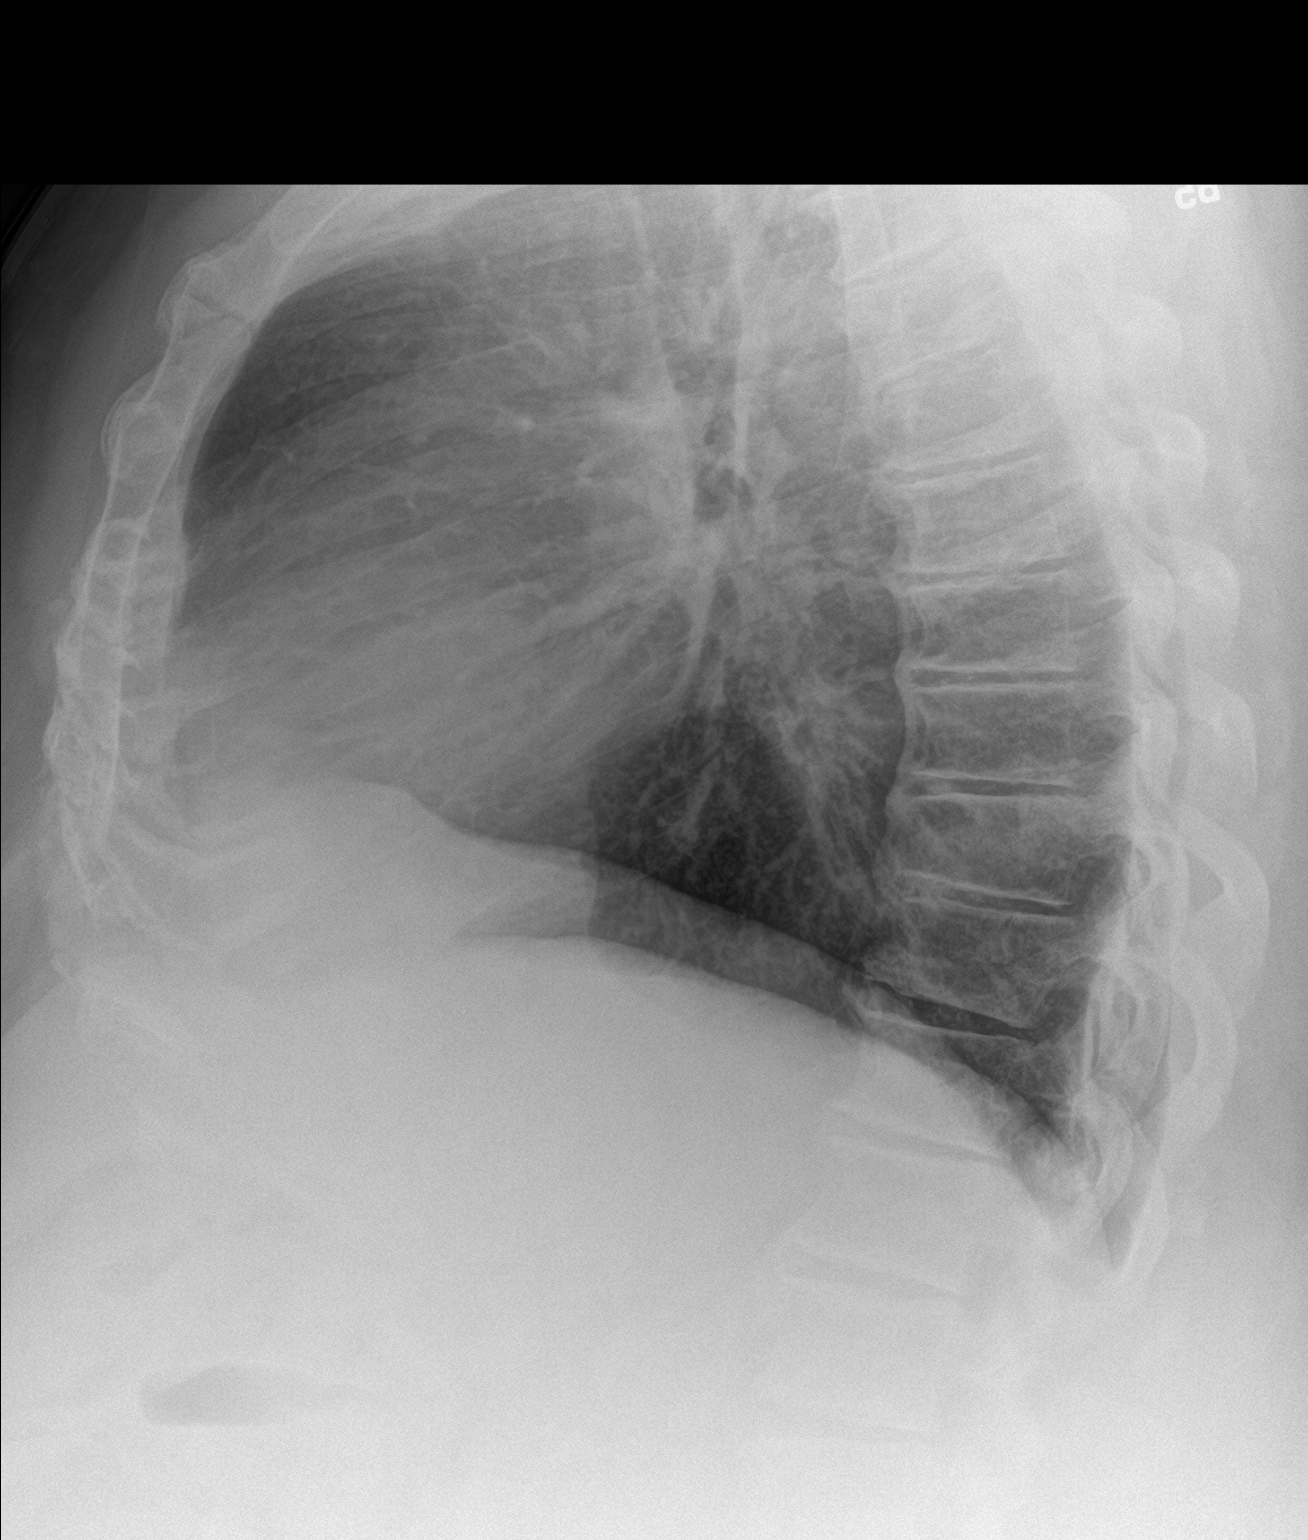

[2 of 2 positions shown; findings below may reference images not displayed]

FINDINGS: Cardiomediastinal silhouette is normal. Mediastinal contours appear
intact.

There is no evidence of focal airspace consolidation, pleural
effusion or pneumothorax. Mild peribronchial thickening.

Osseous structures are without acute abnormality. Soft tissues are
grossly normal.
IMPRESSION: No active cardiopulmonary disease.

Mild peribronchial thickening, likely due to chronic bronchitic
changes.

## 2018-12-24 ENCOUNTER — Telehealth: Payer: Self-pay

## 2018-12-24 NOTE — Telephone Encounter (Signed)
Client called needs a referral form for appointment scheduled at [facility] 12/18/18 8am. Client needs medical assistance with acid reflux.  Referral form taken to [facility], Client notified.  Tarri Fuller  [phone].

## 2019-04-01 ENCOUNTER — Other Ambulatory Visit: Payer: Self-pay

## 2019-04-01 ENCOUNTER — Emergency Department (HOSPITAL_COMMUNITY)
Admission: EM | Admit: 2019-04-01 | Discharge: 2019-04-01 | Disposition: A | Discharge status: Home / Self Care | Payer: Self-pay | Attending: Emergency Medicine | Admitting: Emergency Medicine

## 2019-04-01 ENCOUNTER — Encounter (HOSPITAL_COMMUNITY): Payer: Self-pay

## 2019-04-01 ENCOUNTER — Emergency Department (HOSPITAL_COMMUNITY): Payer: Self-pay

## 2019-04-01 DIAGNOSIS — J45909 Unspecified asthma, uncomplicated: Secondary | ICD-10-CM | POA: Insufficient documentation

## 2019-04-01 DIAGNOSIS — Z7984 Long term (current) use of oral hypoglycemic drugs: Secondary | ICD-10-CM | POA: Insufficient documentation

## 2019-04-01 DIAGNOSIS — Z87891 Personal history of nicotine dependence: Secondary | ICD-10-CM | POA: Insufficient documentation

## 2019-04-01 DIAGNOSIS — E119 Type 2 diabetes mellitus without complications: Secondary | ICD-10-CM | POA: Insufficient documentation

## 2019-04-01 DIAGNOSIS — M79672 Pain in left foot: Secondary | ICD-10-CM | POA: Insufficient documentation

## 2019-04-01 DIAGNOSIS — Z79899 Other long term (current) drug therapy: Secondary | ICD-10-CM | POA: Insufficient documentation

## 2019-04-01 DIAGNOSIS — I10 Essential (primary) hypertension: Secondary | ICD-10-CM | POA: Insufficient documentation

## 2019-04-01 MED ORDER — NAPROXEN 375 MG PO TABS: 375.0000 mg | ORAL_TABLET | Freq: Two times a day (BID) | ORAL | 0 refills | Status: DC

## 2019-04-01 NOTE — ED Provider Notes (Signed)
[facility] EMERGENCY DEPARTMENT Provider Note   CSN: [id number] Arrival date & time: 04/01/19  1420     History Chief Complaint  Patient presents with  . Foot Pain    John Cook is a 49 y.o. male.  Chief complaint pain in the left medial ankle and foot for several days.  No known trauma or injury.  No fever or chills.  Able to walk.  Severity is moderate.  Palpation makes pain worse        Past Medical History:  Diagnosis Date  . Arthritis   . Asthma   . Diabetes mellitus without complication (HCC)   . GERD (gastroesophageal reflux disease)   . History of hiatal hernia   . Hyperlipidemia   . Hypertension   . Mixed hyperlipidemia   . Neuropathy   . Prediabetes   . Sleep apnea     Patient Active Problem List   Diagnosis Date Noted  . SOB (shortness of breath) 11/17/2017  . Excessive daytime sleepiness 11/17/2017  . History of snoring 11/17/2017  . 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 CATARACT EXTRACTION Bilateral    with lens implant  . INCISION AND DRAINAGE ABSCESS Left 09/24/2015   Procedure: INCISION AND DRAINAGE OF LEFT NECK ABSCESS;  Surgeon: Chevis Pretty III, MD;  Location: [facility] OR;  Service: General;  Laterality: Left;  . LEFT HEART CATH AND CORONARY ANGIOGRAPHY N/A 10/16/2017   Procedure: LEFT HEART CATH AND CORONARY ANGIOGRAPHY;  Surgeon: Yvonne Kendall, MD;  Location: [facility] INVASIVE CV LAB;  Service: Cardiovascular;  Laterality: N/A;       Family History  Problem Relation Age of Onset  . Hypertension Mother   . COPD Mother   . Heart disease Mother   . Diabetes Mother   . Heart attack Father     Social History   Tobacco Use  . Smoking status: Former Smoker    Types: Cigarettes    Quit date: 09/23/1995    Years since quitting: 23.5  . Smokeless tobacco: Never Used  Substance Use Topics  . Alcohol use: Not Currently  . Drug use: No    Home Medications Prior to Admission medications     Medication Sig Start Date End Date Taking? Authorizing Provider  acetaminophen (TYLENOL) 500 MG tablet Take 500 mg by mouth every 6 (six) hours as needed for mild pain or moderate pain.    [provider]  atenolol (TENORMIN) 25 MG tablet Take 25 mg by mouth daily.    [provider]  cyclobenzaprine (FLEXERIL) 10 MG tablet Take 1 tablet (10 mg total) by mouth 3 (three) times daily as needed. 02/16/18   Triplett, Tammy, PA-C  gabapentin (NEURONTIN) 300 MG capsule Take 300 mg by mouth 2 (two) times daily.    [provider]  metFORMIN (GLUCOPHAGE) 500 MG tablet Take 500 mg by mouth 2 (two) times daily with a meal.    [provider]  naproxen (NAPROSYN) 375 MG tablet Take 1 tablet (375 mg total) by mouth 2 (two) times daily. 04/01/19   Donnetta Hutching, MD  omeprazole (PRILOSEC) 20 MG capsule Take 20 mg by mouth daily.    [provider]  oxybutynin (DITROPAN) 5 MG tablet Take 5 mg by mouth daily as needed for bladder spasms.    [provider]  PROVENTIL HFA 108 (90 Base) MCG/ACT inhaler Inhale 2 puffs into the lungs every 4 (four) hours as needed for shortness  of breath. 10/02/17   [provider]  traMADol (ULTRAM) 50 MG tablet Take 1 tablet (50 mg total) by mouth every 6 (six) hours as needed. 02/16/18   Triplett, Tammy, PA-C  triamterene-hydrochlorothiazide (MAXZIDE-25) 37.5-25 MG tablet Take 1 tablet by mouth daily.    [provider]    Allergies    Lisinopril  Review of Systems   Review of Systems  All other systems reviewed and are negative.   Physical Exam Updated Vital Signs BP (!) 180/87 (BP Location: Right Arm)   Pulse 77   Temp 98.1 F (36.7 C) (Oral)   Resp 18   Ht 5\' 5"  (1.651 m)   Wt (!) 158.8 kg   BMI 58.24 kg/m   Physical Exam Vitals and nursing note reviewed.  Constitutional:      Appearance: He is well-developed.  HENT:     Head: Normocephalic and atraumatic.  Eyes:     Conjunctiva/sclera:  Conjunctivae normal.  Cardiovascular:     Rate and Rhythm: Normal rate.  Pulmonary:     Effort: Pulmonary effort is normal.  Musculoskeletal:     Cervical back: Neck supple.     Comments: Left lower extremity: Most tender over the medial calcaneus.  No obvious cellulitis.  Skin:    General: Skin is warm and dry.  Neurological:     General: No focal deficit present.     Mental Status: He is alert and oriented to person, place, and time.  Psychiatric:        Behavior: Behavior normal.     ED Results / Procedures / Treatments   Labs (all labs ordered are listed, but only abnormal results are displayed) Labs Reviewed - No data to display  EKG None  Radiology DG Ankle Complete Left  Result Date: 04/01/2019 CLINICAL DATA:  49 year old male with left foot and ankle pain. EXAM: LEFT ANKLE COMPLETE - 3+ VIEW; LEFT FOOT - COMPLETE 3+ VIEW COMPARISON:  None. FINDINGS: There is no acute fracture or dislocation. The bones are well mineralized. No significant arthritic changes. Ankle mortise is intact. Mild diffuse subcutaneous edema. No radiopaque foreign object or soft tissue gas. IMPRESSION: No acute fracture or dislocation. Electronically Signed   By: 53 M.D.   On: 04/01/2019 15:10   DG Foot Complete Left  Result Date: 04/01/2019 CLINICAL DATA:  49 year old male with left foot and ankle pain. EXAM: LEFT ANKLE COMPLETE - 3+ VIEW; LEFT FOOT - COMPLETE 3+ VIEW COMPARISON:  None. FINDINGS: There is no acute fracture or dislocation. The bones are well mineralized. No significant arthritic changes. Ankle mortise is intact. Mild diffuse subcutaneous edema. No radiopaque foreign object or soft tissue gas. IMPRESSION: No acute fracture or dislocation. Electronically Signed   By: 54 M.D.   On: 04/01/2019 15:10    Procedures Procedures (including critical care time)  Medications Ordered in ED Medications - No data to display  ED Course  I have reviewed the triage  vital signs and the nursing notes.  Pertinent labs & imaging results that were available during my care of the patient were reviewed by me and considered in my medical decision making (see chart for details).    MDM Rules/Calculators/A&P                      Uncertain etiology of patient's pain.  Pain does not appear to involve the joint.  Plain films negative.  Immobilize, ice, elevate, referral to orthopedics. Final Clinical Impression(s) /  ED Diagnoses Final diagnoses:  Left foot pain    Rx / DC Orders ED Discharge Orders         Ordered    naproxen (NAPROSYN) 375 MG tablet  2 times daily     04/01/19 1649           Nat Christen, MD 04/01/19 1651

## 2019-04-01 NOTE — ED Triage Notes (Signed)
Pt ambulatory to triage, pt states that he is having some L foot/ankle pain. Denies injury, states that it hurts to put weight on it and it hurts to do ROM.  At rest it is a mild ache and the pain improves.  Denies similar events.

## 2019-04-01 NOTE — Discharge Instructions (Addendum)
X-ray showed no obvious fracture.  Brace, ice, elevate, anti-inflammatory medication, follow-up with orthopedics if not getting better.  Phone number given.

## 2020-11-02 ENCOUNTER — Other Ambulatory Visit (HOSPITAL_COMMUNITY): Payer: Self-pay | Admitting: Physician Assistant

## 2020-11-02 ENCOUNTER — Ambulatory Visit (HOSPITAL_COMMUNITY)
Admission: RE | Admit: 2020-11-02 | Discharge: 2020-11-02 | Disposition: A | Discharge status: Home / Self Care | Payer: Self-pay | Source: Ambulatory Visit | Attending: Physician Assistant | Admitting: Physician Assistant

## 2020-11-02 ENCOUNTER — Other Ambulatory Visit: Payer: Self-pay

## 2020-11-02 DIAGNOSIS — M549 Dorsalgia, unspecified: Secondary | ICD-10-CM

## 2021-12-14 ENCOUNTER — Other Ambulatory Visit: Payer: Self-pay

## 2021-12-14 ENCOUNTER — Emergency Department (HOSPITAL_COMMUNITY)
Admission: EM | Admit: 2021-12-14 | Discharge: 2021-12-14 | Disposition: A | Discharge status: Home / Self Care | Payer: Self-pay | Attending: Emergency Medicine | Admitting: Emergency Medicine

## 2021-12-14 ENCOUNTER — Encounter (HOSPITAL_COMMUNITY): Payer: Self-pay

## 2021-12-14 DIAGNOSIS — M5442 Lumbago with sciatica, left side: Secondary | ICD-10-CM | POA: Insufficient documentation

## 2021-12-14 DIAGNOSIS — Z7984 Long term (current) use of oral hypoglycemic drugs: Secondary | ICD-10-CM | POA: Insufficient documentation

## 2021-12-14 DIAGNOSIS — M5432 Sciatica, left side: Secondary | ICD-10-CM

## 2021-12-14 LAB — CBG MONITORING, ED: Glucose-Capillary: 134 mg/dL — ABNORMAL HIGH (ref 70–99)

## 2021-12-14 MED ORDER — DEXAMETHASONE SODIUM PHOSPHATE 10 MG/ML IJ SOLN
8.0000 mg | Freq: Once | INTRAMUSCULAR | Status: AC
  Administered 2021-12-14: 8 mg via INTRAMUSCULAR
  Filled 2021-12-14: qty 1

## 2021-12-14 MED ORDER — PREDNISONE 10 MG PO TABS: ORAL_TABLET | ORAL | 0 refills | Status: DC

## 2021-12-14 NOTE — ED Triage Notes (Signed)
Pt reports pain in left buttock that radiates down left leg.  Reports has been sitting at mother's bedside upstairs for several days in the ICU and thinks he may have sciatica.

## 2021-12-14 NOTE — Discharge Instructions (Addendum)
Use the medication prescribed in place of your Celebrex for the next week.  Start the prednisone tablets tomorrow as you have received your steroid dose today in the injection.  I do recommend increasing your Flexeril to twice daily.   Avoid lifting,  Bending,  Twisting or any other activity that worsens your pain over the next week.  You should get rechecked if your symptoms are not better over the next 5 days,  Or you develop increased pain,  Weakness in your leg(s) or loss of bladder or bowel function - these are symptoms of a worse injury.

## 2021-12-14 NOTE — ED Provider Notes (Signed)
[facility] EMERGENCY DEPARTMENT Provider Note   CSN: [id number] Arrival date & time: 12/14/21  1009     History  Chief Complaint  Patient presents with   Leg Pain    John Cook is a 51 y.o. male  presenting with acute  left low back pain which has which has been present for the past 2-3 weeks.   Patient denies any new injury specifically but states he spent numerous nights here at his mothers  hospital bed in recent weeks sleeping in a 1/2 reclining chair which he suspects triggered sciatica pain.  There is radiation of pain into the left lower extremity to his calf.  There has been no new weakness or numbness in the lower extremities and no urinary or bowel retention or incontinence.  Patient does not have a history of cancer or IVDU.  The patient has tried tylenol without significant relief of symptoms.  He also takes Flexeril 1 tablet daily chronically.   The history is provided by the patient.       Home Medications Prior to Admission medications   Medication Sig Start Date End Date Taking? Authorizing Provider  predniSONE (DELTASONE) 10 MG tablet Take 5, 4, 3, 2 then 1 tablet by mouth daily for 5 days total. 12/14/21  Yes Miller Limehouse, Raynelle Fanning, PA-C  acetaminophen (TYLENOL) 500 MG tablet Take 500 mg by mouth every 6 (six) hours as needed for mild pain or moderate pain.    [provider]  atenolol (TENORMIN) 25 MG tablet Take 25 mg by mouth daily.    [provider]  cyclobenzaprine (FLEXERIL) 10 MG tablet Take 1 tablet (10 mg total) by mouth 3 (three) times daily as needed. 02/16/18   Triplett, Tammy, PA-C  gabapentin (NEURONTIN) 300 MG capsule Take 300 mg by mouth 2 (two) times daily.    [provider]  metFORMIN (GLUCOPHAGE) 500 MG tablet Take 500 mg by mouth 2 (two) times daily with a meal.    [provider]  naproxen (NAPROSYN) 375 MG tablet Take 1 tablet (375 mg total) by mouth 2 (two) times daily. 04/01/19   Donnetta Hutching, MD  omeprazole (PRILOSEC)  20 MG capsule Take 20 mg by mouth daily.    [provider]  oxybutynin (DITROPAN) 5 MG tablet Take 5 mg by mouth daily as needed for bladder spasms.    [provider]  PROVENTIL HFA 108 (90 Base) MCG/ACT inhaler Inhale 2 puffs into the lungs every 4 (four) hours as needed for shortness of breath. 10/02/17   [provider]  traMADol (ULTRAM) 50 MG tablet Take 1 tablet (50 mg total) by mouth every 6 (six) hours as needed. 02/16/18   Triplett, Tammy, PA-C  triamterene-hydrochlorothiazide (MAXZIDE-25) 37.5-25 MG tablet Take 1 tablet by mouth daily.    [provider]      Allergies    Lisinopril    Review of Systems   Review of Systems  Constitutional:  Negative for fever.  Respiratory:  Negative for shortness of breath.   Cardiovascular:  Negative for chest pain and leg swelling.  Gastrointestinal:  Negative for abdominal distention, abdominal pain and constipation.  Genitourinary:  Negative for difficulty urinating, dysuria, flank pain, frequency and urgency.  Musculoskeletal:  Positive for back pain. Negative for gait problem and joint swelling.  Skin:  Negative for rash.  Neurological:  Negative for weakness and numbness.  All other systems reviewed and are negative.   Physical Exam Updated Vital Signs BP (!) 151/89 (BP  Location: Right Arm)   Pulse 88   Temp 97.8 F (36.6 C) (Oral)   Resp 20   Ht 5\' 5"  (1.651 m)   Wt (!) 147.4 kg   SpO2 100%   BMI 54.08 kg/m  Physical Exam Vitals and nursing note reviewed.  Constitutional:      Appearance: He is well-developed.  HENT:     Head: Normocephalic.  Eyes:     Conjunctiva/sclera: Conjunctivae normal.  Cardiovascular:     Rate and Rhythm: Normal rate.     Pulses: Normal pulses.     Comments: Pedal pulses normal. Pulmonary:     Effort: Pulmonary effort is normal.  Abdominal:     General: Bowel sounds are normal. There is no distension.     Palpations: Abdomen is soft. There is no mass.   Musculoskeletal:        General: Normal range of motion.     Cervical back: Normal range of motion and neck supple.     Lumbar back: Tenderness present. No swelling, edema or spasms.  Skin:    General: Skin is warm and dry.  Neurological:     Mental Status: He is alert.     Sensory: No sensory deficit.     Motor: No tremor or atrophy.     Gait: Gait normal.     Deep Tendon Reflexes:     Reflex Scores:      Patellar reflexes are 2+ on the right side and 2+ on the left side.    Comments: No strength deficit noted in hip and knee flexor and extensor muscle groups.  Ankle flexion and extension intact.     ED Results / Procedures / Treatments   Labs (all labs ordered are listed, but only abnormal results are displayed) Labs Reviewed  CBG MONITORING, ED - Abnormal; Notable for the following components:      Result Value   Glucose-Capillary 134 (*)    All other components within normal limits    EKG None  Radiology No results found.  Procedures Procedures    Medications Ordered in ED Medications  dexamethasone (DECADRON) injection 8 mg (8 mg Intramuscular Given 12/14/21 1144)    ED Course/ Medical Decision Making/ A&P                           Medical Decision Making No neuro deficit on exam or by history to suggest emergent or surgical presentation.  discussed worsened sx that should prompt immediate re-evaluation including distal weakness, bowel/bladder retention/incontinence.  No evidence of cauda equina.  He was encouraged to increase his Flexeril to twice daily dosing.  He was also placed on a reduced dose prednisone taper to start tomorrow, he was given an IM injection of Decadron today.  Advised ice therapy as he has tried heating pad at home which he states tends to escalate his pain symptoms.  Advised parent follow-up with his primary provider if symptoms persist or worsen.  Return precautions were outlined.        Amount and/or Complexity of Data  Reviewed External Data Reviewed: radiology.    Details: Prior lumbar spine films reviewed revealing mild DDD. Labs: ordered.    Details: CBG is stable at 134.  This was obtained prior to starting steroids.  Risk Prescription drug management.           Final Clinical Impression(s) / ED Diagnoses Final diagnoses:  Sciatica of left side    Rx /  DC Orders ED Discharge Orders          Ordered    predniSONE (DELTASONE) 10 MG tablet        12/14/21 1158              Landis Martins 12/14/21 Jodelle Red, MD 12/15/21 1020

## 2021-12-20 ENCOUNTER — Telehealth: Payer: Self-pay

## 2021-12-20 NOTE — Telephone Encounter (Signed)
Called to follow up with [facility] client after recent ER visit for Sciatica. He reports his pain is some better with the prednisone, but not 100% better. He has not called the [facility] for a follow up appointment, he says he has had a "shot" for this pain in the past and may need another. He states he will call to set up an appointment today. His next appointment at [facility] next is 03/16/22.   He states he has no other needs at this time. Will plan follow up again next week. Client is agreeable.  Francee Nodal RN [employer]

## 2021-12-22 ENCOUNTER — Ambulatory Visit: Payer: Self-pay

## 2022-09-07 ENCOUNTER — Ambulatory Visit: Payer: Medicaid Other | Admitting: Orthopaedic Surgery

## 2022-09-07 ENCOUNTER — Encounter: Payer: Self-pay | Admitting: Orthopaedic Surgery

## 2022-09-07 VITALS — BP 111/57 | HR 88 | Ht 65.0 in | Wt 353.0 lb

## 2022-09-07 DIAGNOSIS — M545 Low back pain, unspecified: Secondary | ICD-10-CM | POA: Diagnosis not present

## 2022-09-07 DIAGNOSIS — Z6841 Body Mass Index (BMI) 40.0 and over, adult: Secondary | ICD-10-CM

## 2022-09-07 DIAGNOSIS — M79605 Pain in left leg: Secondary | ICD-10-CM | POA: Diagnosis not present

## 2022-09-07 NOTE — Addendum Note (Signed)
Addended by: Michaele Offer on: 09/07/2022 10:45 AM   Modules accepted: Orders

## 2022-09-07 NOTE — Progress Notes (Signed)
Subjective:    Patient ID: John Cook, male    DOB: 1970-09-12, 52 y.o.   MRN: [id number]  HPI He has had lower back pain for over ten years.  He was dismissed from his work in 2015 because he could not do the job.  He has applied for SSI but has been denied multiple times.  He has had no money, no insurance.  He recently qualified for Medicaid and now has insurance.  He has left sided lower back pain that radiates to the left lateral foot.  It is severe at times.  He has back cramps.  He has no recent trauma.  He has family doctor in [place] who got X-rays of the lumbar spine in May of this year.  I have reviewed the X-rays done at [facility].  He is on Neurontin, Celebrex with little help. He has tried Advil, Tylenol, heat, ice, rubs with no help.    He has no trauma.  He has no other joint pains.  He is limited in sitting for a while or standing for a while.  He is overweight.   Review of Systems  Constitutional:  Positive for activity change.  Respiratory:  Positive for shortness of breath and wheezing.   Musculoskeletal:  Positive for arthralgias, back pain and myalgias.  All other systems reviewed and are negative. For Review of Systems, all other systems reviewed and are negative.  The following is a summary of the past history medically, past history surgically, known current medicines, social history and family history.  This information is gathered electronically by the computer from prior information and documentation.  I review this each visit and have found including this information at this point in the chart is beneficial and informative.   Past Medical History:  Diagnosis Date   Arthritis    Asthma    Diabetes mellitus without complication (HCC)    GERD (gastroesophageal reflux disease)    History of hiatal hernia    Hyperlipidemia    Hypertension    Mixed hyperlipidemia    Neuropathy    Prediabetes    Sleep apnea     Past Surgical History:  Procedure  Laterality Date   CATARACT EXTRACTION Bilateral    with lens implant   INCISION AND DRAINAGE ABSCESS Left 09/24/2015   Procedure: INCISION AND DRAINAGE OF LEFT NECK ABSCESS;  Surgeon: Chevis Pretty III, MD;  Location: [facility] OR;  Service: General;  Laterality: Left;   LEFT HEART CATH AND CORONARY ANGIOGRAPHY N/A 10/16/2017   Procedure: LEFT HEART CATH AND CORONARY ANGIOGRAPHY;  Surgeon: Yvonne Kendall, MD;  Location: [facility] INVASIVE CV LAB;  Service: Cardiovascular;  Laterality: N/A;    Current Outpatient Medications on File Prior to Visit  Medication Sig Dispense Refill   acetaminophen (TYLENOL) 500 MG tablet Take 500 mg by mouth every 6 (six) hours as needed for mild pain or moderate pain.     atenolol (TENORMIN) 25 MG tablet Take 25 mg by mouth daily.     celecoxib (CELEBREX) 200 MG capsule Take by mouth 2 (two) times daily.     DULoxetine (CYMBALTA) 30 MG capsule      fenofibrate (TRICOR) 145 MG tablet Take 145 mg by mouth daily.     gabapentin (NEURONTIN) 300 MG capsule Take 300 mg by mouth 2 (two) times daily.     metFORMIN (GLUCOPHAGE) 500 MG tablet Take 500 mg by mouth 2 (two) times daily with a meal.     omeprazole (PRILOSEC)  20 MG capsule Take 20 mg by mouth daily.     oxybutynin (DITROPAN) 5 MG tablet Take 5 mg by mouth daily as needed for bladder spasms.     PROVENTIL HFA 108 (90 Base) MCG/ACT inhaler Inhale 2 puffs into the lungs every 4 (four) hours as needed for shortness of breath.  0   rosuvastatin (CRESTOR) 20 MG tablet Take 20 mg by mouth at bedtime.     tiZANidine (ZANAFLEX) 4 MG tablet Take by mouth.     triamterene-hydrochlorothiazide (MAXZIDE-25) 37.5-25 MG tablet Take 1 tablet by mouth daily.     No current facility-administered medications on file prior to visit.    Social History   Socioeconomic History   Marital status: Single    Spouse name: Not on file   Number of children: Not on file   Years of education: Not on file   Highest education level: Not on file   Occupational History   Not on file  Tobacco Use   Smoking status: Former    Current packs/day: 0.00    Types: Cigarettes    Quit date: 09/23/1995    Years since quitting: 26.9   Smokeless tobacco: Never  Vaping Use   Vaping status: Never Used  Substance and Sexual Activity   Alcohol use: Not Currently   Drug use: No   Sexual activity: Not on file  Other Topics Concern   Not on file  Social History Narrative   Not on file   Social Determinants of Health   Financial Resource Strain: Not on file  Food Insecurity: Not on file  Transportation Needs: Not on file  Physical Activity: Not on file  Stress: Not on file  Social Connections: Not on file  Intimate Partner Violence: Not on file    Family History  Problem Relation Age of Onset   Hypertension Mother    COPD Mother    Heart disease Mother    Diabetes Mother    Heart attack Father     BP (!) 111/57   Pulse 88   Ht 5\' 5"  (1.651 m)   Wt (!) 353 lb (160.1 kg)   BMI 58.74 kg/m   Body mass index is 58.74 kg/m.      Objective:   Physical Exam Vitals and nursing note reviewed. Exam conducted with a chaperone present.  Constitutional:      Appearance: He is well-developed.  HENT:     Head: Normocephalic and atraumatic.  Eyes:     Extraocular Movements: EOM normal.     Conjunctiva/sclera: Conjunctivae normal.     Pupils: Pupils are equal, round, and reactive to light.  Cardiovascular:     Rate and Rhythm: Normal rate and regular rhythm.  Pulmonary:     Effort: Pulmonary effort is normal.  Abdominal:     Palpations: Abdomen is soft.  Musculoskeletal:       Arms:     Cervical back: Normal range of motion and neck supple.  Skin:    General: Skin is warm and dry.  Neurological:     Mental Status: He is alert and oriented to person, place, and time.     Cranial Nerves: No cranial nerve deficit.     Motor: No abnormal muscle tone.     Coordination: Coordination normal.     Deep Tendon Reflexes: Reflexes  are normal and symmetric. Reflexes normal.  Psychiatric:        Mood and Affect: Mood and affect normal.  Behavior: Behavior normal.        Thought Content: Thought content normal.        Judgment: Judgment normal.     I have independently reviewed and interpreted x-rays of this patient done at another site by another physician or qualified health professional.       Assessment & Plan:   Encounter Diagnoses  Name Primary?   Lumbar pain with radiation down left leg Yes   Morbid obesity (HCC)    Body mass index 50.0-59.9, adult [facility])    He needs a MRI of the lumbar spine.  I am concerned about HNP on left lumbar area.  Return after MRI.  Continue his Celebrex.  Call if any problem.  Precautions discussed.  Electronically Signed Darreld Mclean, MD 8/28/20249:37 AM

## 2022-09-07 NOTE — Patient Instructions (Signed)
Will call and schedule open mri for patient and call him with date and time

## 2022-10-08 ENCOUNTER — Ambulatory Visit
Admission: RE | Admit: 2022-10-08 | Discharge: 2022-10-08 | Disposition: A | Discharge status: Home / Self Care | Payer: Medicaid Other | Source: Ambulatory Visit | Attending: Orthopaedic Surgery | Admitting: Orthopaedic Surgery

## 2022-10-08 DIAGNOSIS — M79605 Pain in left leg: Secondary | ICD-10-CM

## 2022-11-09 ENCOUNTER — Ambulatory Visit: Payer: Medicaid Other | Admitting: Orthopaedic Surgery

## 2022-11-09 ENCOUNTER — Encounter: Payer: Self-pay | Admitting: Orthopaedic Surgery

## 2022-11-09 DIAGNOSIS — M545 Low back pain, unspecified: Secondary | ICD-10-CM

## 2022-11-09 DIAGNOSIS — Z6841 Body Mass Index (BMI) 40.0 and over, adult: Secondary | ICD-10-CM

## 2022-11-09 DIAGNOSIS — M79605 Pain in left leg: Secondary | ICD-10-CM | POA: Diagnosis not present

## 2022-11-09 NOTE — Progress Notes (Signed)
My back is hurting more.  He had MRI of the lumbar spine showing: IMPRESSION: 1. 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 Moderate to severe right and moderate left neural foraminal narrowing at this level. 2. Moderate spinal canal narrowing at L2-L3 secondary to a combination of congenitally small pedicles and a disc bulge. 3. Moderate right neural foraminal narrowing at L3-L4.  I have explained the findings to him.  I will have him see neurosurgery.  He is agreeable to this.  I have independently reviewed the MRI.    He has painful motion of the lower back.  Muscle tone and strength is normal.  NV intact. Gait is good.  He has no spasm.  Encounter Diagnoses  Name Primary?   Lumbar pain with radiation down left leg Yes   Morbid obesity (HCC)    Body mass index 50.0-59.9, adult (HCC)    He is under pain management and will continue that.  To Neurosurgery.  Call if any problem.  Precautions discussed.  Electronically Signed Darreld Mclean, MD 10/30/20248:55 AM

## 2022-11-09 NOTE — Addendum Note (Signed)
Addended by: Michaele Offer on: 11/09/2022 11:04 AM   Modules accepted: Orders

## 2022-11-09 NOTE — Progress Notes (Signed)
Ambulatory

## 2022-11-09 NOTE — Addendum Note (Signed)
Addended by: Michaele Offer on: 11/09/2022 10:16 AM   Modules accepted: Orders

## 2022-11-18 ENCOUNTER — Other Ambulatory Visit: Payer: Self-pay | Admitting: Neurosurgery

## 2022-12-07 ENCOUNTER — Other Ambulatory Visit: Payer: Self-pay | Admitting: Neurosurgery

## 2022-12-23 NOTE — Progress Notes (Signed)
Surgical Instructions   Your procedure is scheduled on Thursday December 29, 2022. Report to [facility] Main Entrance "A" at 10:30 A.M., then check in with the Admitting office. Any questions or running late day of surgery: call [phone]  Questions prior to your surgery date: call [phone], Monday-Friday, 8am-4pm. If you experience any cold or flu symptoms such as cough, fever, chills, shortness of breath, etc. between now and your scheduled surgery, please notify us at the above number.     Remember:  Do not eat or drink after midnight the night before your surgery  Take these medicines the morning of surgery with A SIP OF WATER  aDULoxetine (CYMBALTA)  tenolol (TENORMIN)  fenofibrate (TRICOR)  gabapentin (NEURONTIN)  omeprazole (PRILOSEC)  oxybutynin (DITROPAN)   May take these medicines IF NEEDED: celecoxib (CELEBREX)  PROVENTIL HFA 108 (90 Base) MCG/ACT inhaler. Please bring inhaler with you to the hospital.    One week prior to surgery, STOP taking any Aspirin (unless otherwise instructed by your surgeon) Aleve, Naproxen, Ibuprofen, Motrin, Advil, Goody's, BC's, all herbal medications, fish oil, and non-prescription vitamins.  This includes your celecoxib (CELEBREX).    WHAT DO I DO ABOUT MY DIABETES MEDICATION?   Do not take oral diabetes medicines (pills) the morning of surgery.        DO NOT TAKE YOUR metFORMIN (GLUCOPHAGE) THE MORNING OF SURGERY.     The day of surgery, do not take other diabetes injectables, including Byetta (exenatide), Bydureon (exenatide ER), Victoza (liraglutide), or Trulicity (dulaglutide).  If your CBG is greater than 220 mg/dL, you may take  of your sliding scale (correction) dose of insulin.   HOW TO MANAGE YOUR DIABETES BEFORE AND AFTER SURGERY  Why is it important to control my blood sugar before and after surgery? Improving blood sugar levels before and after surgery helps healing and can limit problems. A way of improving  blood sugar control is eating a healthy diet by:  Eating less sugar and carbohydrates  Increasing activity/exercise  Talking with your doctor about reaching your blood sugar goals High blood sugars (greater than 180 mg/dL) can raise your risk of infections and slow your recovery, so you will need to focus on controlling your diabetes during the weeks before surgery. Make sure that the doctor who takes care of your diabetes knows about your planned surgery including the date and location.  How do I manage my blood sugar before surgery? Check your blood sugar at least 4 times a day, starting 2 days before surgery, to make sure that the level is not too high or low.  Check your blood sugar the morning of your surgery when you wake up and every 2 hours until you get to the Short Stay unit.  If your blood sugar is less than 70 mg/dL, you will need to treat for low blood sugar: Do not take insulin. Treat a low blood sugar (less than 70 mg/dL) with  cup of clear juice (cranberry or apple), 4 glucose tablets, OR glucose gel. Recheck blood sugar in 15 minutes after treatment (to make sure it is greater than 70 mg/dL). If your blood sugar is not greater than 70 mg/dL on recheck, call [phone] for further instructions. Report your blood sugar to the short stay nurse when you get to Short Stay.  If you are admitted to the hospital after surgery: Your blood sugar will be checked by the staff and you will probably be given insulin after surgery (instead of oral diabetes medicines)  to make sure you have good blood sugar levels. The goal for blood sugar control after surgery is 80-180 mg/dL.                        Do NOT Smoke (Tobacco/Vaping) for 24 hours prior to your procedure.  If you use a CPAP at night, you may bring your mask/headgear for your overnight stay.   You will be asked to remove any contacts, glasses, piercing's, hearing aid's, dentures/partials prior to surgery. Please bring cases  for these items if needed.    Patients discharged the day of surgery will not be allowed to drive home, and someone needs to stay with them for 24 hours.  SURGICAL WAITING ROOM VISITATION Patients may have no more than 2 support people in the waiting area - these visitors may rotate.   Pre-op nurse will coordinate an appropriate time for 1 ADULT support person, who may not rotate, to accompany patient in pre-op.  Children under the age of 38 must have an adult with them who is not the patient and must remain in the main waiting area with an adult.  If the patient needs to stay at the hospital during part of their recovery, the visitor guidelines for inpatient rooms apply.  Please refer to the [facility] website for the visitor guidelines for any additional information.   If you received a COVID test during your pre-op visit  it is requested that you wear a mask when out in public, stay away from anyone that may not be feeling well and notify your surgeon if you develop symptoms. If you have been in contact with anyone that has tested positive in the last 10 days please notify you surgeon.      Pre-operative 5 CHG Bathing Instructions   You can play a key role in reducing the risk of infection after surgery. Your skin needs to be as free of germs as possible. You can reduce the number of germs on your skin by washing with CHG (chlorhexidine gluconate) soap before surgery. CHG is an antiseptic soap that kills germs and continues to kill germs even after washing.   DO NOT use if you have an allergy to chlorhexidine/CHG or antibacterial soaps. If your skin becomes reddened or irritated, stop using the CHG and notify one of our RNs at [phone].   Please shower with the CHG soap starting 4 days before surgery using the following schedule:     Please keep in mind the following:  DO NOT shave, including legs and underarms, starting the day of your first shower.   You may shave your face  at any point before/day of surgery.  Place clean sheets on your bed the day you start using CHG soap. Use a clean washcloth (not used since being washed) for each shower. DO NOT sleep with pets once you start using the CHG.   CHG Shower Instructions:  Wash your face and private area with normal soap. If you choose to wash your hair, wash first with your normal shampoo.  After you use shampoo/soap, rinse your hair and body thoroughly to remove shampoo/soap residue.  Turn the water OFF and apply about 3 tablespoons (45 ml) of CHG soap to a CLEAN washcloth.  Apply CHG soap ONLY FROM YOUR NECK DOWN TO YOUR TOES (washing for 3-5 minutes)  DO NOT use CHG soap on face, private areas, open wounds, or sores.  Pay special attention to the area where  your surgery is being performed.  If you are having back surgery, having someone wash your back for you may be helpful. Wait 2 minutes after CHG soap is applied, then you may rinse off the CHG soap.  Pat dry with a clean towel  Put on clean clothes/pajamas   If you choose to wear lotion, please use ONLY the CHG-compatible lotions on the back of this paper.   Additional instructions for the day of surgery: DO NOT APPLY any lotions, deodorants or cologne.    Do not bring valuables to the hospital. [facility] is not responsible for any belongings/valuables. Do not wear jewelry or makeup Put on clean/comfortable clothes.  Please brush your teeth.  Ask your nurse before applying any prescription medications to the skin.     CHG Compatible Lotions   Aveeno Moisturizing lotion  Cetaphil Moisturizing Cream  Cetaphil Moisturizing Lotion  Clairol Herbal Essence Moisturizing Lotion, Dry Skin  Clairol Herbal Essence Moisturizing Lotion, Extra Dry Skin  Clairol Herbal Essence Moisturizing Lotion, Normal Skin  Curel Age Defying Therapeutic Moisturizing Lotion with Alpha Hydroxy  Curel Extreme Care Body Lotion  Curel Soothing Hands Moisturizing Hand  Lotion  Curel Therapeutic Moisturizing Cream, Fragrance-Free  Curel Therapeutic Moisturizing Lotion, Fragrance-Free  Curel Therapeutic Moisturizing Lotion, Original Formula  Eucerin Daily Replenishing Lotion  Eucerin Dry Skin Therapy Plus Alpha Hydroxy Crme  Eucerin Dry Skin Therapy Plus Alpha Hydroxy Lotion  Eucerin Original Crme  Eucerin Original Lotion  Eucerin Plus Crme Eucerin Plus Lotion  Eucerin TriLipid Replenishing Lotion  Keri Anti-Bacterial Hand Lotion  Keri Deep Conditioning Original Lotion Dry Skin Formula Softly Scented  Keri Deep Conditioning Original Lotion, Fragrance Free Sensitive Skin Formula  Keri Lotion Fast Absorbing Fragrance Free Sensitive Skin Formula  Keri Lotion Fast Absorbing Softly Scented Dry Skin Formula  Keri Original Lotion  Keri Skin Renewal Lotion Keri Silky Smooth Lotion  Keri Silky Smooth Sensitive Skin Lotion  Nivea Body Creamy Conditioning Oil  Nivea Body Extra Enriched Lotion  Nivea Body Original Lotion  Nivea Body Sheer Moisturizing Lotion Nivea Crme  Nivea Skin Firming Lotion  NutraDerm 30 Skin Lotion  NutraDerm Skin Lotion  NutraDerm Therapeutic Skin Cream  NutraDerm Therapeutic Skin Lotion  ProShield Protective Hand Cream  Provon moisturizing lotion  Please read over the following fact sheets that you were given.

## 2022-12-26 ENCOUNTER — Inpatient Hospital Stay (HOSPITAL_COMMUNITY)
Admission: RE | Admit: 2022-12-26 | Discharge: 2022-12-26 | Disposition: A | Discharge status: Home / Self Care | Payer: Medicaid Other | Source: Ambulatory Visit | Attending: Neurosurgery

## 2022-12-26 ENCOUNTER — Encounter (HOSPITAL_COMMUNITY): Payer: Self-pay

## 2022-12-26 ENCOUNTER — Other Ambulatory Visit: Payer: Self-pay

## 2022-12-26 VITALS — BP 159/87 | HR 54 | Temp 98.3°F | Resp 19 | Ht 65.0 in | Wt 357.4 lb

## 2022-12-26 DIAGNOSIS — Z01818 Encounter for other preprocedural examination: Secondary | ICD-10-CM | POA: Diagnosis present

## 2022-12-26 LAB — TYPE AND SCREEN
ABO/RH(D): A POS
Antibody Screen: NEGATIVE

## 2022-12-26 LAB — BASIC METABOLIC PANEL
Anion gap: 8 (ref 5–15)
BUN: 19 mg/dL (ref 6–20)
CO2: 28 mmol/L (ref 22–32)
Calcium: 9.1 mg/dL (ref 8.9–10.3)
Chloride: 99 mmol/L (ref 98–111)
Creatinine, Ser: 1.33 mg/dL — ABNORMAL HIGH (ref 0.61–1.24)
GFR, Estimated: >60 mL/min (ref >60)
Glucose, Bld: 119 mg/dL — ABNORMAL HIGH (ref 70–99)
Potassium: 4.1 mmol/L (ref 3.5–5.1)
Sodium: 135 mmol/L (ref 135–145)

## 2022-12-26 LAB — HEMOGLOBIN A1C
Hgb A1c MFr Bld: 6.3 % — ABNORMAL HIGH (ref 4.8–5.6)
Mean Plasma Glucose: 134.11 mg/dL

## 2022-12-26 LAB — CBC
HCT: 47.7 % (ref 39.0–52.0)
Hemoglobin: 15.5 g/dL (ref 13.0–17.0)
MCH: 29.2 pg (ref 26.0–34.0)
MCHC: 32.5 g/dL (ref 30.0–36.0)
MCV: 89.8 fL (ref 80.0–100.0)
Platelets: 243 10*3/uL (ref 150–400)
RBC: 5.31 MIL/uL (ref 4.22–5.81)
RDW: 13.7 % (ref 11.5–15.5)
WBC: 10.6 10*3/uL — ABNORMAL HIGH (ref 4.0–10.5)
nRBC: 0 % (ref 0.0–0.2)

## 2022-12-26 LAB — SURGICAL PCR SCREEN
MRSA, PCR: NEGATIVE
Staphylococcus aureus: NEGATIVE

## 2022-12-26 LAB — GLUCOSE, CAPILLARY: Glucose-Capillary: 135 mg/dL — ABNORMAL HIGH (ref 70–99)

## 2022-12-26 NOTE — Progress Notes (Signed)
PCP - Coral Ceo, FNP Cardiologist - denies  PPM/ICD - denies Device Orders -  Rep Notified -   Chest x-ray - na EKG - 12/26/22 Stress Test - denies ECHO - denies Cardiac Cath - 10/16/17  Sleep Study - 12/04/17 CPAP - no  Fasting Blood Sugar - pt does not have a blood sugar meter and does not check his blood sugar.  Checks Blood Sugar _____ times a day  Last dose of GLP1 agonist-  no GLP1 instructions:   Blood Thinner Instructions:na Aspirin Instructions:na  ERAS Protcol -no PRE-SURGERY Ensure or G2-   COVID TEST- Pt stated that he was at a Christmas party last week where people were coughing;not sure if they had Covid. Pt is not having any Covid symptoms at this time. Notified Areatha Keas of pt's exposure. Per Fayrene Fearing, since pt is not exhibiting any symptoms, no need for Covid test   Anesthesia review: yes  Patient denies shortness of breath, fever, cough and chest pain at PAT appointment   All instructions explained to the patient, with a verbal understanding of the material. Patient agrees to go over the instructions while at home for a better understanding. Patient also instructed to wear a mask when out in public prior to surgery. The opportunity to ask questions was provided.

## 2022-12-26 NOTE — Progress Notes (Addendum)
PCP - Coral Ceo, FNP Cardiologist - denies  PPM/ICD - denies Device Orders -  Rep Notified -   Chest x-ray - na EKG - 12/26/22 Stress Test - denies ECHO - denies Cardiac Cath - 10/16/17  Sleep Study -  CPAP -   Fasting Blood Sugar - pt does not have a blood sugar meter and does not check his blood sugar.  Checks Blood Sugar _____ times a day  Last dose of GLP1 agonist-  no GLP1 instructions:   Blood Thinner Instructions:na Aspirin Instructions:na  ERAS Protcol -no PRE-SURGERY Ensure or G2-   COVID TEST- Pt stated that he was at a Christmas party last week where people were coughing;not sure if they had Covid. Pt is not having any Covid symptoms at this time. Notified Areatha Keas of pt's exposure. Per Fayrene Fearing, since pt is not exhibiting any symptoms, no need for Covid tes.    Anesthesia review: no  Patient denies shortness of breath, fever, cough and chest pain at PAT appointment   All instructions explained to the patient, with a verbal understanding of the material. Patient agrees to go over the instructions while at home for a better understanding. Patient also instructed to wear a mask when out in public prior to surgery. The opportunity to ask questions was provided.

## 2022-12-26 NOTE — Progress Notes (Signed)
Surgical Instructions   Your procedure is scheduled on Thursday December 29, 2022. Report to [facility] Main Entrance "A" at 10:30 A.M., then check in with the Admitting office. Any questions or running late day of surgery: call [phone]  Questions prior to your surgery date: call [phone], Monday-Friday, 8am-4pm. If you experience any cold or flu symptoms such as cough, fever, chills, shortness of breath, etc. between now and your scheduled surgery, please notify us at the above number.     Remember:  Do not eat or drink after midnight the night before your surgery  Take these medicines the morning of surgery with A SIP OF WATER  aDULoxetine (CYMBALTA)  tenolol (TENORMIN)  fenofibrate (TRICOR)  gabapentin (NEURONTIN)  omeprazole (PRILOSEC)  oxybutynin (DITROPAN)   May take these medicines IF NEEDED: PROVENTIL HFA 108 (90 Base) MCG/ACT inhaler. Please bring inhaler with you to the hospital.    One week prior to surgery, STOP taking any Aspirin (unless otherwise instructed by your surgeon) Aleve, Naproxen, Ibuprofen, Motrin, Advil, Goody's, BC's, all herbal medications, fish oil, and non-prescription vitamins.  This includes your celecoxib (CELEBREX).    WHAT DO I DO ABOUT MY DIABETES MEDICATION?   Do not take oral diabetes medicines (pills) the morning of surgery.        DO NOT TAKE YOUR metFORMIN (GLUCOPHAGE) THE MORNING OF SURGERY.     The day of surgery, do not take other diabetes injectables, including Byetta (exenatide), Bydureon (exenatide ER), Victoza (liraglutide), or Trulicity (dulaglutide).  If your CBG is greater than 220 mg/dL, you may take  of your sliding scale (correction) dose of insulin.   HOW TO MANAGE YOUR DIABETES BEFORE AND AFTER SURGERY  Why is it important to control my blood sugar before and after surgery? Improving blood sugar levels before and after surgery helps healing and can limit problems. A way of improving blood sugar control is  eating a healthy diet by:  Eating less sugar and carbohydrates  Increasing activity/exercise  Talking with your doctor about reaching your blood sugar goals High blood sugars (greater than 180 mg/dL) can raise your risk of infections and slow your recovery, so you will need to focus on controlling your diabetes during the weeks before surgery. Make sure that the doctor who takes care of your diabetes knows about your planned surgery including the date and location.  How do I manage my blood sugar before surgery? Check your blood sugar at least 4 times a day, starting 2 days before surgery, to make sure that the level is not too high or low.  Check your blood sugar the morning of your surgery when you wake up and every 2 hours until you get to the Short Stay unit.  If your blood sugar is less than 70 mg/dL, you will need to treat for low blood sugar: Do not take insulin. Treat a low blood sugar (less than 70 mg/dL) with  cup of clear juice (cranberry or apple), 4 glucose tablets, OR glucose gel. Recheck blood sugar in 15 minutes after treatment (to make sure it is greater than 70 mg/dL). If your blood sugar is not greater than 70 mg/dL on recheck, call [phone] for further instructions. Report your blood sugar to the short stay nurse when you get to Short Stay.  If you are admitted to the hospital after surgery: Your blood sugar will be checked by the staff and you will probably be given insulin after surgery (instead of oral diabetes medicines) to make sure  you have good blood sugar levels. The goal for blood sugar control after surgery is 80-180 mg/dL.                        Do NOT Smoke (Tobacco/Vaping) for 24 hours prior to your procedure.  If you use a CPAP at night, you may bring your mask/headgear for your overnight stay.   You will be asked to remove any contacts, glasses, piercing's, hearing aid's, dentures/partials prior to surgery. Please bring cases for these items if  needed.    Patients discharged the day of surgery will not be allowed to drive home, and someone needs to stay with them for 24 hours.  SURGICAL WAITING ROOM VISITATION Patients may have no more than 2 support people in the waiting area - these visitors may rotate.   Pre-op nurse will coordinate an appropriate time for 1 ADULT support person, who may not rotate, to accompany patient in pre-op.  Children under the age of 8 must have an adult with them who is not the patient and must remain in the main waiting area with an adult.  If the patient needs to stay at the hospital during part of their recovery, the visitor guidelines for inpatient rooms apply.  Please refer to the [facility] website for the visitor guidelines for any additional information.   If you received a COVID test during your pre-op visit  it is requested that you wear a mask when out in public, stay away from anyone that may not be feeling well and notify your surgeon if you develop symptoms. If you have been in contact with anyone that has tested positive in the last 10 days please notify you surgeon.      Pre-operative 5 CHG Bathing Instructions   You can play a key role in reducing the risk of infection after surgery. Your skin needs to be as free of germs as possible. You can reduce the number of germs on your skin by washing with CHG (chlorhexidine gluconate) soap before surgery. CHG is an antiseptic soap that kills germs and continues to kill germs even after washing.   DO NOT use if you have an allergy to chlorhexidine/CHG or antibacterial soaps. If your skin becomes reddened or irritated, stop using the CHG and notify one of our RNs at [phone].   Please shower with the CHG soap starting 4 days before surgery using the following schedule:     Please keep in mind the following:  DO NOT shave, including legs and underarms, starting the day of your first shower.   You may shave your face at any point  before/day of surgery.  Place clean sheets on your bed the day you start using CHG soap. Use a clean washcloth (not used since being washed) for each shower. DO NOT sleep with pets once you start using the CHG.   CHG Shower Instructions:  Wash your face and private area with normal soap. If you choose to wash your hair, wash first with your normal shampoo.  After you use shampoo/soap, rinse your hair and body thoroughly to remove shampoo/soap residue.  Turn the water OFF and apply about 3 tablespoons (45 ml) of CHG soap to a CLEAN washcloth.  Apply CHG soap ONLY FROM YOUR NECK DOWN TO YOUR TOES (washing for 3-5 minutes)  DO NOT use CHG soap on face, private areas, open wounds, or sores.  Pay special attention to the area where your surgery is  being performed.  If you are having back surgery, having someone wash your back for you may be helpful. Wait 2 minutes after CHG soap is applied, then you may rinse off the CHG soap.  Pat dry with a clean towel  Put on clean clothes/pajamas   If you choose to wear lotion, please use ONLY the CHG-compatible lotions on the back of this paper.   Additional instructions for the day of surgery: DO NOT APPLY any lotions, deodorants or cologne.    Do not bring valuables to the hospital. [facility] is not responsible for any belongings/valuables. Do not wear jewelry or makeup Put on clean/comfortable clothes.  Please brush your teeth.  Ask your nurse before applying any prescription medications to the skin.     CHG Compatible Lotions   Aveeno Moisturizing lotion  Cetaphil Moisturizing Cream  Cetaphil Moisturizing Lotion  Clairol Herbal Essence Moisturizing Lotion, Dry Skin  Clairol Herbal Essence Moisturizing Lotion, Extra Dry Skin  Clairol Herbal Essence Moisturizing Lotion, Normal Skin  Curel Age Defying Therapeutic Moisturizing Lotion with Alpha Hydroxy  Curel Extreme Care Body Lotion  Curel Soothing Hands Moisturizing Hand Lotion  Curel  Therapeutic Moisturizing Cream, Fragrance-Free  Curel Therapeutic Moisturizing Lotion, Fragrance-Free  Curel Therapeutic Moisturizing Lotion, Original Formula  Eucerin Daily Replenishing Lotion  Eucerin Dry Skin Therapy Plus Alpha Hydroxy Crme  Eucerin Dry Skin Therapy Plus Alpha Hydroxy Lotion  Eucerin Original Crme  Eucerin Original Lotion  Eucerin Plus Crme Eucerin Plus Lotion  Eucerin TriLipid Replenishing Lotion  Keri Anti-Bacterial Hand Lotion  Keri Deep Conditioning Original Lotion Dry Skin Formula Softly Scented  Keri Deep Conditioning Original Lotion, Fragrance Free Sensitive Skin Formula  Keri Lotion Fast Absorbing Fragrance Free Sensitive Skin Formula  Keri Lotion Fast Absorbing Softly Scented Dry Skin Formula  Keri Original Lotion  Keri Skin Renewal Lotion Keri Silky Smooth Lotion  Keri Silky Smooth Sensitive Skin Lotion  Nivea Body Creamy Conditioning Oil  Nivea Body Extra Enriched Lotion  Nivea Body Original Lotion  Nivea Body Sheer Moisturizing Lotion Nivea Crme  Nivea Skin Firming Lotion  NutraDerm 30 Skin Lotion  NutraDerm Skin Lotion  NutraDerm Therapeutic Skin Cream  NutraDerm Therapeutic Skin Lotion  ProShield Protective Hand Cream  Provon moisturizing lotion  Please read over the following fact sheets that you were given.

## 2022-12-27 NOTE — Anesthesia Preprocedure Evaluation (Signed)
Anesthesia Evaluation  Patient identified by MRN, date of birth, ID band Patient awake    Reviewed: Allergy & Precautions, NPO status , Patient's Chart, lab work & pertinent test results  History of Anesthesia Complications Negative for: history of anesthetic complications  Airway Mallampati: II  TM Distance: >3 FB Neck ROM: Full    Dental  (+) Missing,    Pulmonary former smoker   Pulmonary exam normal        Cardiovascular hypertension, Pt. on medications Normal cardiovascular exam     Neuro/Psych Seizures -,     GI/Hepatic ,GERD  Medicated,,  Endo/Other  diabetes, Type 2, Oral Hypoglycemic Agents    Renal/GU Renal InsufficiencyRenal disease (Cr 1.3)  negative genitourinary   Musculoskeletal  (+) Arthritis ,    Abdominal   Peds  Hematology negative hematology ROS (+)   Anesthesia Other Findings MELANOMA RIGHT AXILLA  Reproductive/Obstetrics                              Anesthesia Physical Anesthesia Plan  ASA: 2  Anesthesia Plan: General   Post-op Pain Management: Regional block* and Tylenol PO (pre-op)*   Induction: Intravenous  PONV Risk Score and Plan: 2 and Ondansetron, Dexamethasone, Treatment may vary due to age or medical condition and Midazolam  Airway Management Planned: LMA  Additional Equipment: None  Intra-op Plan:   Post-operative Plan: Extubation in OR  Informed Consent: I have reviewed the patients History and Physical, chart, labs and discussed the procedure including the risks, benefits and alternatives for the proposed anesthesia with the patient or authorized representative who has indicated his/her understanding and acceptance.     Dental advisory given  Plan Discussed with: CRNA  Anesthesia Plan Comments: (PAT note written 12/27/2022 by Shonna Chock, PA-C.  )        Anesthesia Quick Evaluation

## 2022-12-27 NOTE — Progress Notes (Signed)
Anesthesia Chart Review:  Case: [id number] Date/Time: 12/29/22 1210   Procedure: PLIF L45 - 3C   Anesthesia type: General   Pre-op diagnosis: SPONDYLOLISTHESIS OF LUMBAR REGION   [facility] ROOM 20 / [facility] OR   Surgeons: Lisbeth Renshaw, MD       DISCUSSION: Patient is a 52 year old male scheduled for the above procedure.  History includes former smoker (quit 09/23/95), HTN, HLD, GERD, hiatal hernia, DM2, neuropathy, OSA (moderate OSA 2019, does not use CPAP), arthritis. No CAD by 10/17/27 LHC. BMI is consistent with morbid obesity.   He denied chest pain, SOB, cough, fever at PAT RN visit.  A1c 6.3%.  Anesthesia team to evaluate on the day of surgery.   VS: BP (!) 159/87   Pulse (!) 54   Temp 36.8 C   Resp 19   Ht 5\' 5"  (1.651 m)   Wt (!) 162.1 kg   SpO2 96%   BMI 59.47 kg/m    PROVIDERS: Wilmon Pali, FNP is PCP  - He is no routinely followed by cardiology, but he was evaluated by Nona Dell, MD on 10/11/17 for chest pain. He had a LHC on 10/16/17 that showed no CAD, normal LV systolic function, mildly elevated LV filling pressure with marked respiratory variation. Continue primary prevention, diuretic therapy, and OSA evaluation recommended.  - He had vascular surgery evaluation by Gretta Began, MD on 11/06/2017.  No evidence of arterial insufficiency found.  As needed follow-up recommended. - He had OSA evaluation by pulmonologist Virl Diamond, MD on 11/17/17. 12/04/17 HST showed moderate OSA, severe oxygen desaturations.  CPAP therapy recommended.   LABS: Labs reviewed: Acceptable for surgery. (all labs ordered are listed, but only abnormal results are displayed)  Labs Reviewed  GLUCOSE, CAPILLARY - Abnormal; Notable for the following components:      Result Value   Glucose-Capillary 135 (*)    All other components within normal limits  HEMOGLOBIN A1C - Abnormal; Notable for the following components:   Hgb A1c MFr Bld 6.3 (*)    All other components within  normal limits  BASIC METABOLIC PANEL - Abnormal; Notable for the following components:   Glucose, Bld 119 (*)    Creatinine, Ser 1.33 (*)    All other components within normal limits  CBC - Abnormal; Notable for the following components:   WBC 10.6 (*)    All other components within normal limits  SURGICAL PCR SCREEN  TYPE AND SCREEN     IMAGES: MRI L-spine 10/08/22: IMPRESSION: 1. 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 Moderate to severe right and moderate left neural foraminal narrowing at this level. 2. Moderate spinal canal narrowing at L2-L3 secondary to a combination of congenitally small pedicles and a disc bulge. 3. Moderate right neural foraminal narrowing at L3-L4.   EKG: 12/26/22: Sinus bradycardia at 59 bpm Otherwise normal ECG When compared with ECG of 24-Sep-2015 09:19, HR is slower since previous eCG Confirmed by Kristeen Miss [phone]) on 12/27/2022 2:08:18 PM   CV: Cardiac cath 10/16/17: Conclusions: No angiographically significant coronary artery disease. Normal left ventricular systolic function. Mild elevated left ventricular filling pressure with marked respiratory variation.   Recommendations: Primary prevention of coronary artery disease. Consider outpatient diuresis and evaluation for sleep apnea.   No indication for antiplatelet therapy at this time.  Past Medical History:  Diagnosis Date   Arthritis  Asthma    Diabetes mellitus without complication (HCC)    GERD (gastroesophageal reflux disease)    History of hiatal hernia    Hyperlipidemia    Hypertension    Mixed hyperlipidemia    Neuropathy    Prediabetes    Sleep apnea     Past Surgical History:  Procedure Laterality Date   CATARACT EXTRACTION Bilateral    with lens implant   INCISION AND DRAINAGE ABSCESS Left 09/24/2015   Procedure: INCISION AND DRAINAGE  OF LEFT NECK ABSCESS;  Surgeon: Chevis Pretty III, MD;  Location: [facility] OR;  Service: General;  Laterality: Left;   LEFT HEART CATH AND CORONARY ANGIOGRAPHY N/A 10/16/2017   Procedure: LEFT HEART CATH AND CORONARY ANGIOGRAPHY;  Surgeon: Yvonne Kendall, MD;  Location: [facility] INVASIVE CV LAB;  Service: Cardiovascular;  Laterality: N/A;    MEDICATIONS:  acetaminophen (TYLENOL) 500 MG tablet   atenolol (TENORMIN) 50 MG tablet   celecoxib (CELEBREX) 200 MG capsule   DULoxetine (CYMBALTA) 30 MG capsule   fenofibrate (TRICOR) 145 MG tablet   gabapentin (NEURONTIN) 300 MG capsule   metFORMIN (GLUCOPHAGE) 500 MG tablet   omeprazole (PRILOSEC) 20 MG capsule   oxybutynin (DITROPAN) 5 MG tablet   PROVENTIL HFA 108 (90 Base) MCG/ACT inhaler   rosuvastatin (CRESTOR) 20 MG tablet   triamterene-hydrochlorothiazide (MAXZIDE-25) 37.5-25 MG tablet   No current facility-administered medications for this encounter.    Shonna Chock, PA-C Surgical Short Stay/Anesthesiology [facility] Phone [phone] [facility] Phone [phone] 12/27/2022 3:37 PM

## 2022-12-29 ENCOUNTER — Observation Stay (HOSPITAL_COMMUNITY)
Admission: RE | Admit: 2022-12-29 | Discharge: 2022-12-30 | Disposition: A | Discharge status: Home / Self Care | Payer: Medicaid Other | Attending: Neurosurgery | Admitting: Neurosurgery

## 2022-12-29 ENCOUNTER — Encounter (HOSPITAL_COMMUNITY)
Admission: RE | Disposition: A | Discharge status: Home / Self Care | Payer: Self-pay | Source: Home / Self Care | Attending: Neurosurgery

## 2022-12-29 ENCOUNTER — Encounter (HOSPITAL_COMMUNITY): Payer: Self-pay | Admitting: Neurosurgery

## 2022-12-29 ENCOUNTER — Other Ambulatory Visit: Payer: Self-pay

## 2022-12-29 ENCOUNTER — Ambulatory Visit (HOSPITAL_COMMUNITY): Payer: Self-pay | Admitting: Vascular Surgery

## 2022-12-29 ENCOUNTER — Ambulatory Visit (HOSPITAL_BASED_OUTPATIENT_CLINIC_OR_DEPARTMENT_OTHER): Payer: Medicaid Other | Admitting: Certified Registered Nurse Anesthetist

## 2022-12-29 ENCOUNTER — Ambulatory Visit (HOSPITAL_COMMUNITY): Payer: Medicaid Other

## 2022-12-29 DIAGNOSIS — J45909 Unspecified asthma, uncomplicated: Secondary | ICD-10-CM | POA: Insufficient documentation

## 2022-12-29 DIAGNOSIS — M4316 Spondylolisthesis, lumbar region: Principal | ICD-10-CM | POA: Insufficient documentation

## 2022-12-29 DIAGNOSIS — M7138 Other bursal cyst, other site: Secondary | ICD-10-CM | POA: Diagnosis not present

## 2022-12-29 DIAGNOSIS — Z87891 Personal history of nicotine dependence: Secondary | ICD-10-CM | POA: Diagnosis not present

## 2022-12-29 DIAGNOSIS — Z79899 Other long term (current) drug therapy: Secondary | ICD-10-CM | POA: Diagnosis not present

## 2022-12-29 DIAGNOSIS — M7130 Other bursal cyst, unspecified site: Principal | ICD-10-CM | POA: Diagnosis present

## 2022-12-29 DIAGNOSIS — Z01818 Encounter for other preprocedural examination: Secondary | ICD-10-CM

## 2022-12-29 DIAGNOSIS — Z7984 Long term (current) use of oral hypoglycemic drugs: Secondary | ICD-10-CM | POA: Insufficient documentation

## 2022-12-29 DIAGNOSIS — I10 Essential (primary) hypertension: Secondary | ICD-10-CM | POA: Insufficient documentation

## 2022-12-29 DIAGNOSIS — E119 Type 2 diabetes mellitus without complications: Secondary | ICD-10-CM | POA: Insufficient documentation

## 2022-12-29 HISTORY — PX: SPINAL FUSION: SHX223

## 2022-12-29 LAB — GLUCOSE, CAPILLARY
Glucose-Capillary: 149 mg/dL — ABNORMAL HIGH (ref 70–99)
Glucose-Capillary: 130 mg/dL — ABNORMAL HIGH (ref 70–99)
Glucose-Capillary: 104 mg/dL — ABNORMAL HIGH (ref 70–99)
Glucose-Capillary: 198 mg/dL — ABNORMAL HIGH (ref 70–99)
Glucose-Capillary: 187 mg/dL — ABNORMAL HIGH (ref 70–99)

## 2022-12-29 LAB — ABO/RH: ABO/RH(D): A POS

## 2022-12-29 SURGERY — POSTERIOR LUMBAR FUSION 1 LEVEL
Anesthesia: General | Site: Back

## 2022-12-29 MED ORDER — SODIUM CHLORIDE 0.9% FLUSH
3.0000 mL | Freq: Two times a day (BID) | INTRAVENOUS | Status: DC
  Administered 2022-12-29 – 2022-12-30 (×2): 3 mL via INTRAVENOUS

## 2022-12-29 MED ORDER — MIDAZOLAM HCL 2 MG/2ML IJ SOLN
INTRAMUSCULAR | Status: AC
Start: 2022-12-29 — End: ?
  Filled 2022-12-29: qty 2

## 2022-12-29 MED ORDER — ROCURONIUM BROMIDE 10 MG/ML (PF) SYRINGE
PREFILLED_SYRINGE | INTRAVENOUS | Status: DC | PRN
  Administered 2022-12-29: 100 mg via INTRAVENOUS

## 2022-12-29 MED ORDER — DOCUSATE SODIUM 100 MG PO CAPS
100.0000 mg | ORAL_CAPSULE | Freq: Two times a day (BID) | ORAL | Status: DC
  Administered 2022-12-29 – 2022-12-30 (×2): 100 mg via ORAL
  Filled 2022-12-29 (×2): qty 1

## 2022-12-29 MED ORDER — ROCURONIUM BROMIDE 10 MG/ML (PF) SYRINGE
PREFILLED_SYRINGE | INTRAVENOUS | Status: AC
  Filled 2022-12-29: qty 10

## 2022-12-29 MED ORDER — BUPIVACAINE HCL (PF) 0.5 % IJ SOLN
INTRAMUSCULAR | Status: DC | PRN
  Administered 2022-12-29: 5 mL

## 2022-12-29 MED ORDER — ONDANSETRON HCL 4 MG/2ML IJ SOLN
INTRAMUSCULAR | Status: DC | PRN
  Administered 2022-12-29: 4 mg via INTRAVENOUS

## 2022-12-29 MED ORDER — MIDAZOLAM HCL 5 MG/5ML IJ SOLN
INTRAMUSCULAR | Status: DC | PRN
  Administered 2022-12-29: 1 mg via INTRAVENOUS

## 2022-12-29 MED ORDER — SUCCINYLCHOLINE CHLORIDE 200 MG/10ML IV SOSY
PREFILLED_SYRINGE | INTRAVENOUS | Status: DC | PRN
  Administered 2022-12-29: 180 mg via INTRAVENOUS

## 2022-12-29 MED ORDER — PANTOPRAZOLE SODIUM 40 MG PO TBEC
40.0000 mg | DELAYED_RELEASE_TABLET | Freq: Every day | ORAL | Status: DC
  Administered 2022-12-30: 40 mg via ORAL
  Filled 2022-12-29 (×2): qty 1

## 2022-12-29 MED ORDER — OXYBUTYNIN CHLORIDE 5 MG PO TABS
5.0000 mg | ORAL_TABLET | Freq: Every day | ORAL | Status: DC
Start: 2022-12-30 — End: 2022-12-30
  Administered 2022-12-30: 5 mg via ORAL
  Filled 2022-12-29: qty 1

## 2022-12-29 MED ORDER — ONDANSETRON HCL 4 MG PO TABS: 4.0000 mg | ORAL_TABLET | Freq: Four times a day (QID) | ORAL | Status: DC | PRN

## 2022-12-29 MED ORDER — MIDAZOLAM HCL 2 MG/2ML IJ SOLN
INTRAMUSCULAR | Status: AC
  Filled 2022-12-29: qty 2

## 2022-12-29 MED ORDER — PHENYLEPHRINE 80 MCG/ML (10ML) SYRINGE FOR IV PUSH (FOR BLOOD PRESSURE SUPPORT)
PREFILLED_SYRINGE | INTRAVENOUS | Status: AC
  Filled 2022-12-29: qty 10

## 2022-12-29 MED ORDER — ONDANSETRON HCL 4 MG/2ML IJ SOLN
4.0000 mg | Freq: Four times a day (QID) | INTRAMUSCULAR | Status: DC | PRN
Start: 2022-12-29 — End: 2022-12-30

## 2022-12-29 MED ORDER — SODIUM CHLORIDE 0.9 % IV SOLN
250.0000 mL | INTRAVENOUS | Status: DC
  Administered 2022-12-29: 250 mL via INTRAVENOUS

## 2022-12-29 MED ORDER — ACETAMINOPHEN 325 MG PO TABS: 650.0000 mg | ORAL_TABLET | ORAL | Status: DC | PRN

## 2022-12-29 MED ORDER — HYDROMORPHONE HCL 1 MG/ML IJ SOLN
INTRAMUSCULAR | Status: AC
  Filled 2022-12-29: qty 1

## 2022-12-29 MED ORDER — EPHEDRINE SULFATE-NACL 50-0.9 MG/10ML-% IV SOSY
PREFILLED_SYRINGE | INTRAVENOUS | Status: DC | PRN
  Administered 2022-12-29 (×2): 10 mg via INTRAVENOUS

## 2022-12-29 MED ORDER — PHENYLEPHRINE HCL-NACL 20-0.9 MG/250ML-% IV SOLN
INTRAVENOUS | Status: DC | PRN
  Administered 2022-12-29: 25 ug/min via INTRAVENOUS

## 2022-12-29 MED ORDER — TRIAMTERENE-HCTZ 37.5-25 MG PO TABS
1.0000 | ORAL_TABLET | Freq: Every day | ORAL | Status: DC
  Administered 2022-12-30: 1 via ORAL
  Filled 2022-12-29: qty 1

## 2022-12-29 MED ORDER — ONDANSETRON HCL 4 MG/2ML IJ SOLN
INTRAMUSCULAR | Status: AC
  Filled 2022-12-29: qty 2

## 2022-12-29 MED ORDER — METHOCARBAMOL 1000 MG/10ML IJ SOLN: 500.0000 mg | Freq: Four times a day (QID) | INTRAMUSCULAR | Status: DC | PRN

## 2022-12-29 MED ORDER — BACITRACIN ZINC 500 UNIT/GM EX OINT
TOPICAL_OINTMENT | CUTANEOUS | Status: DC | PRN
  Administered 2022-12-29: 1 via TOPICAL

## 2022-12-29 MED ORDER — METFORMIN HCL 500 MG PO TABS
500.0000 mg | ORAL_TABLET | Freq: Every day | ORAL | Status: DC
Start: 2022-12-30 — End: 2022-12-30
  Filled 2022-12-29: qty 1

## 2022-12-29 MED ORDER — OXYCODONE HCL 5 MG PO TABS
5.0000 mg | ORAL_TABLET | ORAL | Status: DC | PRN
  Administered 2022-12-30: 5 mg via ORAL

## 2022-12-29 MED ORDER — THROMBIN 5000 UNITS EX SOLR
CUTANEOUS | Status: AC
  Filled 2022-12-29: qty 5000

## 2022-12-29 MED ORDER — SENNA 8.6 MG PO TABS
1.0000 | ORAL_TABLET | Freq: Two times a day (BID) | ORAL | Status: DC
  Administered 2022-12-29 – 2022-12-30 (×2): 8.6 mg via ORAL
  Filled 2022-12-29 (×2): qty 1

## 2022-12-29 MED ORDER — INSULIN ASPART 100 UNIT/ML IJ SOLN
0.0000 [IU] | INTRAMUSCULAR | Status: DC | PRN
Start: 2022-12-29 — End: 2022-12-29
  Administered 2022-12-29: 3 [IU] via SUBCUTANEOUS

## 2022-12-29 MED ORDER — BACITRACIN ZINC 500 UNIT/GM EX OINT
TOPICAL_OINTMENT | CUTANEOUS | Status: AC
  Filled 2022-12-29: qty 28.35

## 2022-12-29 MED ORDER — ALBUMIN HUMAN 5 % IV SOLN: INTRAVENOUS | Status: DC | PRN

## 2022-12-29 MED ORDER — CEFAZOLIN SODIUM 1 G IJ SOLR
INTRAMUSCULAR | Status: AC
  Filled 2022-12-29: qty 30

## 2022-12-29 MED ORDER — SODIUM CHLORIDE (PF) 0.9 % IJ SOLN
INTRAMUSCULAR | Status: AC
  Filled 2022-12-29: qty 10

## 2022-12-29 MED ORDER — FENTANYL CITRATE (PF) 250 MCG/5ML IJ SOLN
INTRAMUSCULAR | Status: AC
  Filled 2022-12-29: qty 5

## 2022-12-29 MED ORDER — ATENOLOL 25 MG PO TABS
50.0000 mg | ORAL_TABLET | Freq: Every day | ORAL | Status: DC
Start: 2022-12-30 — End: 2022-12-30
  Administered 2022-12-30: 50 mg via ORAL
  Filled 2022-12-29: qty 2

## 2022-12-29 MED ORDER — ROSUVASTATIN CALCIUM 20 MG PO TABS
20.0000 mg | ORAL_TABLET | Freq: Every day | ORAL | Status: DC
  Administered 2022-12-29: 20 mg via ORAL
  Filled 2022-12-29: qty 1

## 2022-12-29 MED ORDER — SODIUM CHLORIDE 0.9 % IV SOLN
3.0000 g | INTRAVENOUS | Status: AC
  Administered 2022-12-29 (×2): 3 g via INTRAVENOUS
  Filled 2022-12-29: qty 3

## 2022-12-29 MED ORDER — VECURONIUM BROMIDE 10 MG IV SOLR
INTRAVENOUS | Status: AC
  Filled 2022-12-29: qty 10

## 2022-12-29 MED ORDER — PROPOFOL 10 MG/ML IV BOLUS
INTRAVENOUS | Status: AC
  Filled 2022-12-29: qty 20

## 2022-12-29 MED ORDER — ACETAMINOPHEN 10 MG/ML IV SOLN
INTRAVENOUS | Status: DC | PRN
  Administered 2022-12-29: 1000 mg via INTRAVENOUS

## 2022-12-29 MED ORDER — HYDROMORPHONE HCL 1 MG/ML IJ SOLN
0.2500 mg | INTRAMUSCULAR | Status: DC | PRN
  Administered 2022-12-29: 0.5 mg via INTRAVENOUS

## 2022-12-29 MED ORDER — PHENOL 1.4 % MT LIQD: 1.0000 | OROMUCOSAL | Status: DC | PRN

## 2022-12-29 MED ORDER — ACETAMINOPHEN 10 MG/ML IV SOLN
INTRAVENOUS | Status: AC
  Filled 2022-12-29: qty 100

## 2022-12-29 MED ORDER — METHOCARBAMOL 500 MG PO TABS: 500.0000 mg | ORAL_TABLET | Freq: Four times a day (QID) | ORAL | Status: DC | PRN

## 2022-12-29 MED ORDER — MORPHINE SULFATE (PF) 2 MG/ML IV SOLN
2.0000 mg | INTRAVENOUS | Status: DC | PRN
  Administered 2022-12-29 – 2022-12-30 (×2): 2 mg via INTRAVENOUS
  Filled 2022-12-29 (×2): qty 1

## 2022-12-29 MED ORDER — PROPOFOL 10 MG/ML IV BOLUS
INTRAVENOUS | Status: DC | PRN
  Administered 2022-12-29: 250 mg via INTRAVENOUS
  Administered 2022-12-29: 50 mg via INTRAVENOUS
  Administered 2022-12-29: 20 mg via INTRAVENOUS

## 2022-12-29 MED ORDER — DEXAMETHASONE SODIUM PHOSPHATE 10 MG/ML IJ SOLN
INTRAMUSCULAR | Status: DC | PRN
  Administered 2022-12-29: 10 mg via INTRAVENOUS

## 2022-12-29 MED ORDER — LACTATED RINGERS IV SOLN: INTRAVENOUS | Status: DC | PRN

## 2022-12-29 MED ORDER — 0.9 % SODIUM CHLORIDE (POUR BTL) OPTIME
TOPICAL | Status: DC | PRN
  Administered 2022-12-29: 1000 mL

## 2022-12-29 MED ORDER — LIDOCAINE-EPINEPHRINE 1 %-1:100000 IJ SOLN
INTRAMUSCULAR | Status: AC
  Filled 2022-12-29: qty 1

## 2022-12-29 MED ORDER — SUGAMMADEX SODIUM 200 MG/2ML IV SOLN
INTRAVENOUS | Status: DC | PRN
  Administered 2022-12-29: 200 mg via INTRAVENOUS

## 2022-12-29 MED ORDER — CHLORHEXIDINE GLUCONATE CLOTH 2 % EX PADS: 6.0000 | MEDICATED_PAD | Freq: Once | CUTANEOUS | Status: DC

## 2022-12-29 MED ORDER — CEFAZOLIN SODIUM-DEXTROSE 2-4 GM/100ML-% IV SOLN
2.0000 g | Freq: Three times a day (TID) | INTRAVENOUS | Status: AC
  Administered 2022-12-30 (×2): 2 g via INTRAVENOUS
  Filled 2022-12-29 (×2): qty 100

## 2022-12-29 MED ORDER — ACETAMINOPHEN 10 MG/ML IV SOLN: 1000.0000 mg | Freq: Once | INTRAVENOUS | Status: DC

## 2022-12-29 MED ORDER — LIDOCAINE 2% (20 MG/ML) 5 ML SYRINGE
INTRAMUSCULAR | Status: DC | PRN
  Administered 2022-12-29: 40 mg via INTRAVENOUS

## 2022-12-29 MED ORDER — LIDOCAINE HCL (PF) 2 % IJ SOLN
INTRAMUSCULAR | Status: DC | PRN
  Administered 2022-12-29: 100 mg via INTRADERMAL

## 2022-12-29 MED ORDER — LIDOCAINE 2% (20 MG/ML) 5 ML SYRINGE
INTRAMUSCULAR | Status: AC
  Filled 2022-12-29: qty 5

## 2022-12-29 MED ORDER — ROCURONIUM BROMIDE 10 MG/ML (PF) SYRINGE
PREFILLED_SYRINGE | INTRAVENOUS | Status: AC
Start: 2022-12-29 — End: ?
  Filled 2022-12-29: qty 10

## 2022-12-29 MED ORDER — MENTHOL 3 MG MT LOZG: 1.0000 | LOZENGE | OROMUCOSAL | Status: DC | PRN

## 2022-12-29 MED ORDER — SODIUM CHLORIDE (PF) 0.9 % IJ SOLN
INTRAMUSCULAR | Status: AC
  Filled 2022-12-29: qty 20

## 2022-12-29 MED ORDER — BUPIVACAINE HCL (PF) 0.5 % IJ SOLN
INTRAMUSCULAR | Status: AC
  Filled 2022-12-29: qty 30

## 2022-12-29 MED ORDER — ALBUTEROL SULFATE (2.5 MG/3ML) 0.083% IN NEBU: 3.0000 mL | INHALATION_SOLUTION | RESPIRATORY_TRACT | Status: DC | PRN

## 2022-12-29 MED ORDER — DULOXETINE HCL 30 MG PO CPEP
30.0000 mg | ORAL_CAPSULE | Freq: Two times a day (BID) | ORAL | Status: DC
  Administered 2022-12-29 – 2022-12-30 (×2): 30 mg via ORAL
  Filled 2022-12-29 (×2): qty 1

## 2022-12-29 MED ORDER — ACETAMINOPHEN 650 MG RE SUPP: 650.0000 mg | RECTAL | Status: DC | PRN

## 2022-12-29 MED ORDER — SODIUM CHLORIDE 0.9 % IV SOLN: INTRAVENOUS | Status: DC | PRN

## 2022-12-29 MED ORDER — PHENYLEPHRINE HCL-NACL 20-0.9 MG/250ML-% IV SOLN: INTRAVENOUS | Status: DC | PRN

## 2022-12-29 MED ORDER — CHLORHEXIDINE GLUCONATE 0.12 % MT SOLN
15.0000 mL | Freq: Once | OROMUCOSAL | Status: AC
  Administered 2022-12-29: 15 mL via OROMUCOSAL
  Filled 2022-12-29: qty 15

## 2022-12-29 MED ORDER — LIDOCAINE-EPINEPHRINE 1 %-1:100000 IJ SOLN
INTRAMUSCULAR | Status: DC | PRN
  Administered 2022-12-29: 5 mL

## 2022-12-29 MED ORDER — PHENYLEPHRINE 80 MCG/ML (10ML) SYRINGE FOR IV PUSH (FOR BLOOD PRESSURE SUPPORT)
PREFILLED_SYRINGE | INTRAVENOUS | Status: DC | PRN
  Administered 2022-12-29 (×3): 80 ug via INTRAVENOUS
  Administered 2022-12-29: 160 ug via INTRAVENOUS

## 2022-12-29 MED ORDER — BISACODYL 10 MG RE SUPP: 10.0000 mg | Freq: Every day | RECTAL | Status: DC | PRN

## 2022-12-29 MED ORDER — ORAL CARE MOUTH RINSE
15.0000 mL | Freq: Once | OROMUCOSAL | Status: AC
Start: 2022-12-29 — End: 2022-12-29

## 2022-12-29 MED ORDER — FENOFIBRATE 160 MG PO TABS
160.0000 mg | ORAL_TABLET | Freq: Every day | ORAL | Status: DC
  Administered 2022-12-29: 160 mg via ORAL
  Filled 2022-12-29: qty 1

## 2022-12-29 MED ORDER — SODIUM CHLORIDE 0.9% FLUSH
3.0000 mL | INTRAVENOUS | Status: DC | PRN
Start: 2022-12-29 — End: 2022-12-30

## 2022-12-29 MED ORDER — OXYCODONE HCL 5 MG PO TABS
10.0000 mg | ORAL_TABLET | ORAL | Status: DC | PRN
  Administered 2022-12-30 (×3): 10 mg via ORAL
  Filled 2022-12-29 (×4): qty 2

## 2022-12-29 MED ORDER — THROMBIN 5000 UNITS EX SOLR: OROMUCOSAL | Status: DC | PRN

## 2022-12-29 MED ORDER — EPHEDRINE 5 MG/ML INJ
INTRAVENOUS | Status: AC
  Filled 2022-12-29: qty 5

## 2022-12-29 MED ORDER — FENTANYL CITRATE (PF) 250 MCG/5ML IJ SOLN
INTRAMUSCULAR | Status: DC | PRN
  Administered 2022-12-29 (×3): 50 ug via INTRAVENOUS

## 2022-12-29 MED ORDER — GABAPENTIN 300 MG PO CAPS
600.0000 mg | ORAL_CAPSULE | Freq: Two times a day (BID) | ORAL | Status: DC
  Administered 2022-12-29 – 2022-12-30 (×2): 600 mg via ORAL
  Filled 2022-12-29 (×2): qty 2

## 2022-12-29 MED ORDER — CHLORHEXIDINE GLUCONATE CLOTH 2 % EX PADS
6.0000 | MEDICATED_PAD | Freq: Once | CUTANEOUS | Status: DC
Start: 2022-12-29 — End: 2022-12-29

## 2022-12-29 MED ORDER — KETOROLAC TROMETHAMINE 30 MG/ML IJ SOLN
INTRAMUSCULAR | Status: AC
  Filled 2022-12-29: qty 1

## 2022-12-29 SURGICAL SUPPLY — 66 items
BAG COUNTER SPONGE SURGICOUNT (BAG) ×2 IMPLANT
BASKET BONE COLLECTION (BASKET) ×2 IMPLANT
BENZOIN TINCTURE PRP APPL 2/3 (GAUZE/BANDAGES/DRESSINGS) IMPLANT
BLADE CLIPPER SURG (BLADE) IMPLANT
BLADE SURG 11 STRL SS (BLADE) ×2 IMPLANT
BUR MATCHSTICK NEURO 3.0 LAGG (BURR) ×2 IMPLANT
BUR PRECISION FLUTE 5.0 (BURR) ×2 IMPLANT
CABLE BIPOLOR RESECTION CORD (MISCELLANEOUS) IMPLANT
CAGE INTERBODY PL LG 7X26.5X24 (Cage) IMPLANT
CANISTER SUCT 3000ML PPV (MISCELLANEOUS) ×2 IMPLANT
CANISTER WOUND CARE 500ML ATS (WOUND CARE) IMPLANT
CNTNR URN SCR LID CUP LEK RST (MISCELLANEOUS) ×2 IMPLANT
COVER BACK TABLE 60X90IN (DRAPES) ×2 IMPLANT
DERMABOND ADVANCED .7 DNX12 (GAUZE/BANDAGES/DRESSINGS) ×2 IMPLANT
DRAPE C-ARM 42X72 X-RAY (DRAPES) ×2 IMPLANT
DRAPE C-ARMOR (DRAPES) ×2 IMPLANT
DRAPE LAPAROTOMY 100X72X124 (DRAPES) ×2 IMPLANT
DRAPE SURG 17X23 STRL (DRAPES) ×2 IMPLANT
DRESSING PEEL AND PLC PRVNA 13 (GAUZE/BANDAGES/DRESSINGS) IMPLANT
DRSG PEEL AND PLACE PREVENA 13 (GAUZE/BANDAGES/DRESSINGS) ×1
DURAPREP 26ML APPLICATOR (WOUND CARE) ×2 IMPLANT
ELECT BLADE 4.0 EZ CLEAN MEGAD (MISCELLANEOUS) ×1
ELECT REM PT RETURN 9FT ADLT (ELECTROSURGICAL) ×1
ELECTRODE BLDE 4.0 EZ CLN MEGD (MISCELLANEOUS) IMPLANT
ELECTRODE REM PT RTRN 9FT ADLT (ELECTROSURGICAL) ×2 IMPLANT
GAUZE 4X4 16PLY ~~LOC~~+RFID DBL (SPONGE) IMPLANT
GAUZE SPONGE 4X4 12PLY STRL (GAUZE/BANDAGES/DRESSINGS) IMPLANT
GLOVE BIOGEL PI IND STRL 7.5 (GLOVE) ×4 IMPLANT
GLOVE ECLIPSE 7.0 STRL STRAW (GLOVE) ×4 IMPLANT
GLOVE EXAM NITRILE XL STR (GLOVE) IMPLANT
GOWN STRL REUS W/ TWL LRG LVL3 (GOWN DISPOSABLE) ×8 IMPLANT
GOWN STRL REUS W/ TWL XL LVL3 (GOWN DISPOSABLE) IMPLANT
GOWN STRL REUS W/TWL 2XL LVL3 (GOWN DISPOSABLE) IMPLANT
GRAFT BONE PROTEIOS XS 0.5CC (Orthopedic Implant) IMPLANT
HEMOSTAT POWDER KIT SURGIFOAM (HEMOSTASIS) ×2 IMPLANT
KIT BASIN OR (CUSTOM PROCEDURE TRAY) ×2 IMPLANT
KIT DRSG PREVENA PLUS 7DAY 125 (MISCELLANEOUS) IMPLANT
KIT POSITION SURG JACKSON T1 (MISCELLANEOUS) ×2 IMPLANT
KIT TURNOVER KIT B (KITS) ×2 IMPLANT
MILL BONE PREP (MISCELLANEOUS) ×2 IMPLANT
MILL MEDIUM DISP (BLADE) IMPLANT
NDL HYPO 18GX1.5 BLUNT FILL (NEEDLE) IMPLANT
NDL HYPO 22X1.5 SAFETY MO (MISCELLANEOUS) ×2 IMPLANT
NDL SPNL 18GX3.5 QUINCKE PK (NEEDLE) IMPLANT
NEEDLE HYPO 18GX1.5 BLUNT FILL (NEEDLE) IMPLANT
NEEDLE HYPO 22X1.5 SAFETY MO (MISCELLANEOUS) ×1 IMPLANT
NEEDLE SPNL 18GX3.5 QUINCKE PK (NEEDLE) IMPLANT
NS IRRIG 1000ML POUR BTL (IV SOLUTION) ×2 IMPLANT
PACK LAMINECTOMY NEURO (CUSTOM PROCEDURE TRAY) ×2 IMPLANT
PAD ARMBOARD 7.5X6 YLW CONV (MISCELLANEOUS) ×6 IMPLANT
PUTTY GRAFTON DBF 6CC W/DELIVE (Putty) IMPLANT
ROD COBALT 47.5X35 (Rod) IMPLANT
SCREW SET SOLERA TI (Screw) IMPLANT
SCREW SOLERA 40X6.5XMA NS SPNE (Screw) IMPLANT
SPIKE FLUID TRANSFER (MISCELLANEOUS) ×2 IMPLANT
SPONGE SURGIFOAM ABS GEL 100 (HEMOSTASIS) IMPLANT
SPONGE T-LAP 4X18 ~~LOC~~+RFID (SPONGE) IMPLANT
STAPLER SKIN PROX WIDE 3.9 (STAPLE) IMPLANT
STRIP CLOSURE SKIN 1/2X4 (GAUZE/BANDAGES/DRESSINGS) IMPLANT
SUT VIC AB 0 CT1 18XCR BRD8 (SUTURE) ×2 IMPLANT
SUT VICRYL 3-0 RB1 18 ABS (SUTURE) ×2 IMPLANT
SYR 3ML LL SCALE MARK (SYRINGE) ×6 IMPLANT
TOWEL GREEN STERILE (TOWEL DISPOSABLE) ×2 IMPLANT
TOWEL GREEN STERILE FF (TOWEL DISPOSABLE) ×2 IMPLANT
TRAY FOLEY MTR SLVR 16FR STAT (SET/KITS/TRAYS/PACK) ×2 IMPLANT
WATER STERILE IRR 1000ML POUR (IV SOLUTION) ×2 IMPLANT

## 2022-12-29 NOTE — H&P (Signed)
Chief Complaint   Back and leg pain  History of Present Illness  Mr. John Cook is a 52 year old man seen for initial consultation at the request of Dr. Hilda Lias. He is referred for evaluation of chronic back and bilateral leg pain. Patient reports onset of symptoms about 20 years ago. He has been dealing with this problem for many years, he reports exacerbation of symptoms over the last 1-2 years. At this point he is reporting relatively constant severe bilateral back pain, worsened with activity. He notes radiation of pain, numbness, and subjective weakness down both of his legs whenever he stands or walks for more than a few minutes at a time. He says when he is walking he has a hard time extending his legs and says he walks "like a duck." When he sits down, pain in the legs as well as the paresthesias slowly resolved. Since the pain has gotten worse over the last year to he has been on different medical treatment through the [facility] department. He has been on anti-inflammatories as well as gabapentin without significant improvement. He has also been referred for a course of physical therapy which he completed six months ago. Again, he did not have significant improvement in symptoms.  Of note, the patient reports a history of hypertension and diabetes, both medically controlled. He does not report any known lung, liver, kidney disease. No cancer history. He is not on any blood thinners or antiplatelet agents. He is a nonsmoker. He is overweight.   Past Medical History   Past Medical History:  Diagnosis Date   Arthritis    Asthma    Diabetes mellitus without complication (HCC)    GERD (gastroesophageal reflux disease)    History of hiatal hernia    Hyperlipidemia    Hypertension    Mixed hyperlipidemia    Neuropathy    Prediabetes    Sleep apnea     Past Surgical History   Past Surgical History:  Procedure Laterality Date   CATARACT EXTRACTION Bilateral    with lens  implant   INCISION AND DRAINAGE ABSCESS Left 09/24/2015   Procedure: INCISION AND DRAINAGE OF LEFT NECK ABSCESS;  Surgeon: Chevis Pretty III, MD;  Location[facility] OR;  Service: General;  Laterality: Left;   LEFT HEART CATH AND CORONARY ANGIOGRAPHY N/A 10/16/2017   Procedure: LEFT HEART CATH AND CORONARY ANGIOGRAPHY;  Surgeon: Yvonne Kendall, MD;  [facility] INVASIVE CV LAB;  Service: Cardiovascular;  Laterality: N/A;    Social History   Social History   Tobacco Use   Smoking status: Former    Current packs/day: 0.00    Types: Cigarettes    Quit date: 09/23/1995    Years since quitting: 27.2   Smokeless tobacco: Never  Vaping Use   Vaping status: Never Used  Substance Use Topics   Alcohol use: Not Currently   Drug use: No    Medications   Prior to Admission medications   Medication Sig Start Date End Date Taking? Authorizing Provider  acetaminophen (TYLENOL) 500 MG tablet Take 500 mg by mouth every 6 (six) hours as needed for mild pain or moderate pain.   Yes [provider]  atenolol (TENORMIN) 50 MG tablet Take 50 mg by mouth daily.   Yes [provider]  DULoxetine (CYMBALTA) 30 MG capsule Take 30 mg by mouth 2 (two) times daily. 05/05/22  Yes [provider]  fenofibrate (TRICOR) 145 MG tablet Take 145 mg by mouth daily. 09/01/22  Yes [provider]  gabapentin (NEURONTIN) 300 MG capsule Take 600 mg by mouth 2 (two) times daily.   Yes [provider]  metFORMIN (GLUCOPHAGE) 500 MG tablet Take 500 mg by mouth daily.   Yes [provider]  omeprazole (PRILOSEC) 20 MG capsule Take 20 mg by mouth in the morning and at bedtime.   Yes [provider]  oxybutynin (DITROPAN) 5 MG tablet Take 5 mg by mouth daily.   Yes [provider]  PROVENTIL HFA 108 (90 Base) MCG/ACT inhaler Inhale 2 puffs into the lungs every 4 (four) hours as needed for shortness of breath or wheezing. 10/02/17  Yes [provider]   rosuvastatin (CRESTOR) 20 MG tablet Take 20 mg by mouth at bedtime. 09/01/22  Yes [provider]  triamterene-hydrochlorothiazide (MAXZIDE-25) 37.5-25 MG tablet Take 1 tablet by mouth daily.   Yes [provider]  celecoxib (CELEBREX) 200 MG capsule Take 200 mg by mouth 2 (two) times daily. 09/01/22   [provider]    Allergies   Allergies  Allergen Reactions   Lisinopril Cough    Review of Systems  ROS  Neurologic Exam  Awake, alert, oriented Memory and concentration grossly intact Speech fluent, appropriate CN grossly intact Motor exam: Upper Extremities Deltoid Bicep Tricep Grip  Right 5/5 5/5 5/5 5/5  Left 5/5 5/5 5/5 5/5   Lower Extremities IP Quad PF DF EHL  Right 5/5 5/5 5/5 5/5 5/5  Left 5/5 5/5 5/5 5/5 5/5   Sensation grossly intact to LT  Imaging  MRI reveals a large dorsal synovial cyst at L4-5 with severe associated stenosis, joint diastasis, and development of spondylolisthesis in upright position on dynamic xrays  Impression  - 52 y.o. male with back and leg pain unresponsive to conservative treatment related to spondylolisthesis and synovial cyst at L4-5  Plan  - We will proceed with decompression/fusion at L4-5  I have reviewed the indications for the procedure as well as the details of the procedure and the expected postoperative course and recovery at length with the patient in the office. We have also reviewed in detail the risks, benefits, and alternatives to the procedure. All questions were answered and Otho Perl provided informed consent to proceed.  Lisbeth Renshaw, MD [facility]

## 2022-12-29 NOTE — Progress Notes (Signed)
Orthopedic Tech Progress Note Patient Details:  John Cook [id number] [id number]  Ortho Devices Type of Ortho Device: Lumbar corsett Ortho Device/Splint Location: BACK Ortho Device/Splint Interventions: Ordered   Post Interventions Patient Tolerated: Well Instructions Provided: Care of device  Donald Pore 12/29/2022, 7:47 PM

## 2022-12-29 NOTE — Transfer of Care (Signed)
Immediate Anesthesia Transfer of Care Note  Patient: John Cook  Procedure(s) Performed: Posterior Lumbar Interbody Fusion Lumbar four-five (Back)  Patient Location: PACU  Anesthesia Type:General  Level of Consciousness: awake, drowsy, pateint uncooperative, confused, and responds to stimulation  Airway & Oxygen Therapy: Patient Spontanous Breathing and Patient connected to face mask oxygen  Post-op Assessment: Report given to RN and Post -op Vital signs reviewed and stable  Post vital signs: Reviewed and stable  Last Vitals:  Vitals Value Taken Time  BP 149/76 12/29/22 1905  Temp 99 1905  Pulse 80 12/29/22 1910  Resp 17 12/29/22 1908  SpO2 93 % 12/29/22 1910  Vitals shown include unfiled device data.  MDA at bedside in PACU; pt restless, easily reassured with verbal cues; room air   Patients Stated Pain Goal: 0 (12/29/22 1034)  Complications:  Encounter Notable Events  Notable Event Outcome Phase Comment  Difficult to intubate - expected  Intraprocedure Filed from anesthesia note documentation.

## 2022-12-29 NOTE — Anesthesia Procedure Notes (Signed)
Procedure Name: Intubation Date/Time: 12/29/2022 2:01 PM  Performed by: Waynard Edwards, CRNAPre-anesthesia Checklist: Patient identified, Emergency Drugs available, Suction available and Patient being monitored Patient Re-evaluated:Patient Re-evaluated prior to induction Oxygen Delivery Method: Circle system utilized Preoxygenation: Pre-oxygenation with 100% oxygen Induction Type: IV induction and Rapid sequence Laryngoscope Size: Glidescope and 4 Grade View: Grade I Tube type: Oral Tube size: 8.0 mm Number of attempts: 1 Airway Equipment and Method: Rigid stylet and Video-laryngoscopy Placement Confirmation: ETT inserted through vocal cords under direct vision, positive ETCO2 and breath sounds checked- equal and bilateral Secured at: 24 cm Tube secured with: Tape Dental Injury: Teeth and Oropharynx as per pre-operative assessment  Difficulty Due To: Difficulty was anticipated, Difficult Airway- due to large tongue and Difficult Airway- due to limited oral opening Comments: Glidescope 4 used.  Grade 1 view but lots of redundant tissue.  8.0 ETT passed and EBBS.

## 2022-12-29 NOTE — Anesthesia Postprocedure Evaluation (Signed)
Anesthesia Post Note  Patient: John Cook  Procedure(s) Performed: Posterior Lumbar Interbody Fusion Lumbar four-five (Back)     Patient location during evaluation: PACU Anesthesia Type: General Level of consciousness: sedated and patient cooperative Pain management: pain level controlled Vital Signs Assessment: post-procedure vital signs reviewed and stable Respiratory status: spontaneous breathing Cardiovascular status: stable Anesthetic complications: yes   Encounter Notable Events  Notable Event Outcome Phase Comment  Difficult to intubate - expected  Intraprocedure Filed from anesthesia note documentation.    Last Vitals:  Vitals:   12/29/22 2000 12/29/22 2038  BP: (!) 155/82 (!) 163/80  Pulse: 81 82  Resp: 15 19  Temp: 36.7 C 36.6 C  SpO2: 96% 95%    Last Pain:  Vitals:   12/29/22 2038  TempSrc: Oral  PainSc:                  Lewie Loron

## 2022-12-29 NOTE — Op Note (Signed)
NEUROSURGERY OPERATIVE NOTE   PREOP DIAGNOSIS:  1. Spondylolisthesis L4-5 2. Synovial cyst, L4-5  POSTOP DIAGNOSIS: Same  PROCEDURE: 1. L4 laminectomy with facetectomy and resection of synovial cyst for decompression of exiting nerve roots, more than would be required for placement of interbody graft 2. Placement of anterior interbody device - Medtronic Titan 7mm long expandable cages x2 3. Posterior non-segmental instrumentation using Medtronic cortical pedicle screws at L4 - L5  4. Interbody arthrodesis, L4-5 5. Use of locally harvested bone autograft 6. Use of non-structural bone allograft - ProteiOs, DBM  SURGEON: Dr. Lisbeth Renshaw, MD  ASSISTANT: Dr. Barnett Abu, MD  ANESTHESIA: General Endotracheal  EBL: 300cc  SPECIMENS: None  DRAINS: None  COMPLICATIONS: None immediate  CONDITION: Hemodynamically stable to PACU  HISTORY: John Cook is a 52 y.o. male who has been followed in the outpatient clinic with back and leg pain related to stenosis at L4-5 including a large synovial cyst and associated spondylolisthesis and joint arthropathy/diastasis. Multiple conservative treatments were attempted without significant improvement and we ultimately elected to proceed with surgical decompression and fusion. Risks, benefits, and alternative treatments were reviewed in detail in the office. After all questions were answered, informed consent was obtained and witnessed.  PROCEDURE IN DETAIL: The patient was brought to the operating room via stretcher. After induction of general anesthesia, the patient was positioned on the operative table in the prone position. All pressure points were meticulously padded. Incision was then marked out and prepped and draped in the usual sterile fashion.  After timeout was conducted, skin was infiltrated with local anesthetic. Skin incision was then made sharply and Bovie electrocautery was used to dissect the subcutaneous tissue until the  lumbodorsal fascia was identified and incised. The muscle was then elevated in the subperiosteal plane and the L4 lamina and L4-5 facet complexes were identified. Self-retaining retractors were then placed. Lateral fluoroscopy was taken with a dissector in the L4-5 interspace to confirm our location.  At this point attention was turned to decompression. Complete L4 laminectomy was completed with a high-speed drill and Kerrison punches.  Normal dura was identified on the right. The medial portion of the synovial cyst was identified emanating from the left L4-5 facet. A ball-tipped dissector was then used to identify the foramina bilaterally.  High-speed drill was used to cut across the pars interarticularis and the inferior articulating process of L4 was removed bilaterally.  I then removed the medial and superior aspect of the L5 SAP. I then dissected the synovial cyst sharply away from the dura and removed it en bloc. I was then able to easily pass a ball dissector in the ventral epidural space and underneath the bilateral L4 and L5 nerves indicating good decompression.   Disc space was then identified, incised bilaterally, and using a combination of shavers, curettes and rongeurs, complete discectomy was completed. Endplates were prepared, and bone harvested during decompression was mixed with proteiOs and packed into the interspace. A 7mm cage was tapped into place bilaterall. Cages were expanded to achieve good endplate apposition.  Good position was confirmed with fluoroscopy.  At this point, the entry points for bilateral L4 and L5cortical pedicle screws were identified using standard anatomic landmarks and lateral fluoro. Pilot holes were then drilled and tapped to 6.5 x 40mm. Screws were then placed in L4 and L5. Prebent lordotic rod was then sized and placed into the pedicle screws. Set screws were placed and final tightened. Final AP and lateral fluoroscopic images confirmed good  position.  Hemostasis was secured and confirmed with bipolar cautery and morcellized gelfoam with thrombin. The wound was then irrigated with copious amounts of antibiotic saline, then closed in standard fashion using a combination of interrupted 0 and 3-0 Vicryl stitches in the muscular, fascial, and subcutaneous layers. Skin was then closed using standard Dermabond. Sterile dressing was then applied. The patient was then transferred to the stretcher, extubated, and taken to the postanesthesia care unit in stable hemodynamic condition.  At the end of the case all sponge, needle, cottonoid, and instrument counts were correct.   Lisbeth Renshaw, MD [facility]

## 2022-12-30 DIAGNOSIS — M4316 Spondylolisthesis, lumbar region: Secondary | ICD-10-CM | POA: Diagnosis not present

## 2022-12-30 MED ORDER — OXYCODONE HCL 10 MG PO TABS: 10.0000 mg | ORAL_TABLET | Freq: Four times a day (QID) | ORAL | 0 refills | Status: DC | PRN

## 2022-12-30 MED ORDER — METHOCARBAMOL 500 MG PO TABS: 500.0000 mg | ORAL_TABLET | Freq: Four times a day (QID) | ORAL | 0 refills | Status: DC | PRN

## 2022-12-30 NOTE — Plan of Care (Signed)

## 2022-12-30 NOTE — Plan of Care (Signed)
Discharging to home

## 2022-12-30 NOTE — Discharge Summary (Signed)
Physician Discharge Summary  Patient ID: John Cook MRN: [id number] DOB/AGE: 1970-11-12 52 y.o.  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 male admitted after L4-5 decompression/fusion. He was reporting improvement in leg pain postop. Pain was controlled. He requested d/c home  Treatments: Surgery - L4-5 PLIF  Discharge Exam: Blood pressure (!) 156/60, pulse 92, temperature 99.5 F (37.5 C), temperature source Oral, resp. rate 19, height 5\' 5"  (1.651 m), weight (!) 158.8 kg, SpO2 90%. Awake, alert, oriented Speech fluent, appropriate CN grossly intact 5/5 BUE/BLE Wound c/d/i  Disposition: Discharge disposition: 01-Home or Self Care       Discharge Instructions     Call MD for:  redness, tenderness, or signs of infection (pain, swelling, redness, odor or green/yellow discharge around incision site)   Complete by: As directed    Call MD for:  temperature >100.4   Complete by: As directed    Diet - low sodium heart healthy   Complete by: As directed    Discharge instructions   Complete by: As directed    Walk at home as much as possible, at least 4 times / day   Discharge wound care:   Complete by: As directed    Leave vac in place, remove at home in 6 days. If cannot maintain suction, can remove vac dressing at home before 6 days.   Incentive spirometry RT   Complete by: As directed    Increase activity slowly   Complete by: As directed    Lifting restrictions   Complete by: As directed    No lifting > 10 lbs   May shower / Bathe   Complete by: As directed    48 hours after surgery   May walk up steps   Complete by: As directed    Other Restrictions   Complete by: As directed    No bending/twisting at waist      Allergies as of 12/30/2022        Reactions   Lisinopril Cough        Medication List     TAKE these medications    acetaminophen 500 MG tablet Commonly known as: TYLENOL Take 500 mg by mouth every 6 (six) hours as needed for mild pain or moderate pain.   atenolol 50 MG tablet Commonly known as: TENORMIN Take 50 mg by mouth daily.   celecoxib 200 MG capsule Commonly known as: CELEBREX Take 200 mg by mouth 2 (two) times daily.   DULoxetine 30 MG capsule Commonly known as: CYMBALTA Take 30 mg by mouth 2 (two) times daily.   fenofibrate 145 MG tablet Commonly known as: TRICOR Take 145 mg by mouth daily.   gabapentin 300 MG capsule Commonly known as: NEURONTIN Take 600 mg by mouth 2 (two) times daily.   metFORMIN 500 MG tablet Commonly known as: GLUCOPHAGE Take 500 mg by mouth daily.   methocarbamol 500 MG tablet Commonly known as: ROBAXIN Take 1 tablet (500 mg total) by mouth every 6 (six) hours as needed for muscle spasms.   omeprazole 20 MG capsule Commonly known as: PRILOSEC Take 20 mg by mouth in the morning and at bedtime.   oxybutynin 5 MG tablet Commonly known as: DITROPAN Take 5 mg by mouth daily.   Oxycodone HCl 10 MG Tabs  Take 1 tablet (10 mg total) by mouth every 6 (six) hours as needed for up to 7 days for severe pain (pain score 7-10).   Proventil HFA 108 (90 Base) MCG/ACT inhaler Generic drug: albuterol Inhale 2 puffs into the lungs every 4 (four) hours as needed for shortness of breath or wheezing.   rosuvastatin 20 MG tablet Commonly known as: CRESTOR Take 20 mg by mouth at bedtime.   triamterene-hydrochlorothiazide 37.5-25 MG tablet Commonly known as: MAXZIDE-25 Take 1 tablet by mouth daily.               Discharge Care Instructions  (From admission, onward)           Start     Ordered   12/30/22 0000  Discharge wound care:       Comments: Leave vac in place, remove at home in 6 days. If cannot maintain suction, can remove vac dressing at home  before 6 days.   12/30/22 1145            Follow-up Information     Lisbeth Renshaw, MD Follow up in 2 week(s).   Specialty: Neurosurgery Contact information: [address] [place] [address] [phone]                 Signed: Jackelyn Hoehn 12/30/2022, 11:47 AM

## 2022-12-30 NOTE — TOC Transition Note (Signed)
Transition of Care [facility]) - Discharge Note   Patient Details  Name: John Cook MRN: [id number] Date of Birth: 16-Dec-1970  Transition of [facility]) CM/SW Contact:  Lockie Pares, RN Phone Number: 12/30/2022, 2:17 PM   Clinical Narrative:    Orders for [facility], Patient has Medicaid , low chance of acceptance. Orders for OP PT and OT done. They will call patient  to schedule.   Final next level of care: Home/Self Care Barriers to Discharge: No Barriers Identified   Patient Goals and CMS Choice            Discharge Placement             Home self care with proveena          Discharge Plan and Services Additional resources added to the After Visit Summary for                            [facility] Arranged:  (OP PT OT)          Social Drivers of Health (SDOH) Interventions SDOH Screenings   Food Insecurity: Food Insecurity Present (12/29/2022)  Housing: High Risk (12/29/2022)  Transportation Needs: Unmet Transportation Needs (12/29/2022)  Utilities: Not At Risk (12/29/2022)  Tobacco Use: Medium Risk (12/29/2022)     Readmission Risk Interventions     No data to display

## 2022-12-30 NOTE — Care Management (Signed)
  Transition of Care [facility]) Screening Note   Patient Details  Name: XAEDEN LAMARTINA Date of Birth: Apr 19, 1970   Transition of Care [facility]) CM/SW Contact:    Lockie Pares, RN Phone Number: 12/30/2022, 12:18 PM    Transition of Care Department [facility]) has reviewed patient and no TOC needs have been identified at this time. We will continue to monitor patient advancement through interdisciplinary progression rounds. If new patient transition needs arise, please place a TOC consult.  Patient has a proveena instructions given by MD

## 2022-12-30 NOTE — Evaluation (Signed)
Physical Therapy Evaluation Patient Details Name: John Cook MRN: [id number] DOB: [birth year] Today's Date: 12/30/2022  History of Present Illness  Patient is a 52 yo male presenting to the hospital for L4-L5 laminectomy and fusion and resection of synovial cyst on 12/29/22. PMH includes: asthma, DM, GERD, HLD, HTN  Clinical Impression  Patient presents with decreased mobility though mobilizing with S throughout session.  He was mobilizing prior to admission though with effort and pain.  States had fallen x 1 tripped on a piece of rebar in his yard.  Patient able to ambulate with RW and mobilize back into bed with S.  Seems to be able to go home alone, though has limited resources for support at d/c.  PT will follow up prior to d/c.       If plan is discharge home, recommend the following: Assist for transportation;Help with stairs or ramp for entrance;Assistance with cooking/housework   Can travel by private vehicle        Equipment Recommendations None recommended by PT  Recommendations for Other Services       Functional Status Assessment Patient has had a recent decline in their functional status and demonstrates the ability to make significant improvements in function in a reasonable and predictable amount of time.     Precautions / Restrictions Precautions Precautions: Back;Fall Precaution Booklet Issued: Yes (comment) Precaution Comments: verbally reviewed Required Braces or Orthoses: Spinal Brace Spinal Brace: Lumbar corset;Applied in sitting position Restrictions Other Position/Activity Restrictions: attempted to apply brace and extended to XL size, but still not able to fit around, called ortho tech for extenders      Mobility  Bed Mobility Overal bed mobility: Needs Assistance Bed Mobility: Sit to Sidelying         Sit to sidelying: Supervision General bed mobility comments: cues for technique, heavy use of bedrails, pt reports plans to consider a recliner at  home    Transfers Overall transfer level: Needs assistance Equipment used: None Transfers: Sit to/from Stand Sit to Stand: Supervision           General transfer comment: increased time up from toilet in bathroom though performed unaided, 3x up from EOB with S.    Ambulation/Gait Ambulation/Gait assistance: Supervision Gait Distance (Feet): 120 Feet Assistive device: Rolling walker (2 wheels) Gait Pattern/deviations: Step-through pattern, Decreased stride length, Wide base of support, Trunk flexed       General Gait Details: reports does not straighten all the way up usually  Stairs            Wheelchair Mobility     Tilt Bed    Modified Rankin (Stroke Patients Only)       Balance Overall balance assessment: Needs assistance   Sitting balance-Leahy Scale: Good     Standing balance support: No upper extremity supported Standing balance-Leahy Scale: Fair Standing balance comment: standing in bathroom initially no UE support, then used RW                             Pertinent Vitals/Pain Pain Assessment Pain Score: 8  Pain Location: back and right hip Pain Descriptors / Indicators: Discomfort, Guarding, Grimacing Pain Intervention(s): Monitored during session, Repositioned, RN gave pain meds during session    Home Living Family/patient expects to be discharged to:: Private residence Living Arrangements: Alone   Type of Home: Mobile home Home Access: Ramped entrance;Other (comment) (states 1 step to enter)  Home Layout: One level Home Equipment: Teacher, English as a foreign language (2 wheels)      Prior Function Prior Level of Function : Independent/Modified Independent;Driving;History of Falls (last six months)             Mobility Comments: 1 fall, tripped on a piece of rebar in his yard ADLs Comments: independent, driving, managing finances     Extremity/Trunk Assessment   Upper Extremity Assessment Upper Extremity Assessment:  Defer to OT evaluation    Lower Extremity Assessment Lower Extremity Assessment: Generalized weakness       Communication   Communication Communication: No apparent difficulties  Cognition Arousal: Alert Behavior During Therapy: WFL for tasks assessed/performed Overall Cognitive Status: Within Functional Limits for tasks assessed                                          General Comments General comments (skin integrity, edema, etc.): wound vac in place throughout.  Educated pt on importance of continuing "rehab" at home meaning not to over do it trying to take care of himself and to ask friends for help with heavier tasks to ensure he can heal properly from his surgery.  Admits not many people he can rely on but verbalized understanding of education.    Exercises     Assessment/Plan    PT Assessment Patient needs continued PT services  PT Problem List Decreased strength;Decreased mobility;Decreased knowledge of precautions;Pain;Decreased activity tolerance;Decreased knowledge of use of DME       PT Treatment Interventions DME instruction;Gait training;Patient/family education;Functional mobility training;Therapeutic activities;Therapeutic exercise;Balance training    PT Goals (Current goals can be found in the Care Plan section)  Acute Rehab PT Goals Patient Stated Goal: return home, independent PT Goal Formulation: With patient Time For Goal Achievement: 01/13/23 Potential to Achieve Goals: Good    Frequency Min 1X/week     Co-evaluation               AM-PAC PT "6 Clicks" Mobility  Outcome Measure Help needed turning from your back to your side while in a flat bed without using bedrails?: A Little Help needed moving from lying on your back to sitting on the side of a flat bed without using bedrails?: A Little Help needed moving to and from a bed to a chair (including a wheelchair)?: None Help needed standing up from a chair using your arms  (e.g., wheelchair or bedside chair)?: None Help needed to walk in hospital room?: None Help needed climbing 3-5 steps with a railing? : Total 6 Click Score: 19    End of Session Equipment Utilized During Treatment: Back brace Activity Tolerance: Patient tolerated treatment well Patient left: in bed;with call bell/phone within reach   PT Visit Diagnosis: Other abnormalities of gait and mobility (R26.89);Muscle weakness (generalized) (M62.81);Pain Pain - part of body:  (back)    Time: 1610-9604 PT Time Calculation (min) (ACUTE ONLY): 15 min   Charges:   PT Evaluation $PT Eval Moderate Complexity: 1 Mod   PT General Charges $$ ACUTE PT VISIT: 1 Visit         Sheran Lawless, PT Acute Rehabilitation Services Office:[phone] 12/30/2022   Elray Mcgregor 12/30/2022, 2:07 PM

## 2022-12-30 NOTE — Progress Notes (Signed)
Patient discharged home.  Picked up by family member.  Sent an extra TEFL teacher with the patient. Pt was instructed about the [facility] and home care.  Notified Annamaria Helling, RN, CM, about home health orders.

## 2022-12-30 NOTE — Evaluation (Signed)
Occupational Therapy Evaluation Patient Details Name: John Cook MRN: [id number] DOB: Nov 14, 1970 Today's Date: 12/30/2022   History of Present Illness Patient is a 52 yo male presenting to the hospital for L4-L5 laminectomy and fusion and resection of synovial cyst on 12/29/22. PMH includes: asthma, DM, GERD, HLD, HTN   Clinical Impression   Prior to this admission, patient lived alone, drove, but did not work. Patient was able to complete his ADLs and functional mobility independently with effort. Patient presenting with 9/10 back pain however able to complete bed mobility at supervision level. Patient with need for contact gaurd assist for ADL management and transfers. Verbally reveiwed back precautions and brace donning, but did not don prior to short trip to the bathroom. Educated that patient should have brace on for longer bouts of ambulation. OT anticipates no need for OT at discharge, but will continue to follow acutely.      If plan is discharge home, recommend the following: A little help with walking and/or transfers;A little help with bathing/dressing/bathroom;Assistance with cooking/housework;Assist for transportation (initially)    Functional Status Assessment  Patient has had a recent decline in their functional status and demonstrates the ability to make significant improvements in function in a reasonable and predictable amount of time.  Equipment Recommendations  None recommended by OT    Recommendations for Other Services       Precautions / Restrictions Precautions Precautions: Back;Fall Precaution Booklet Issued: Yes (comment) Precaution Comments: verbally reviewed Required Braces or Orthoses: Spinal Brace Spinal Brace: Lumbar corset;Applied in sitting position Restrictions Weight Bearing Restrictions Per Provider Order: No      Mobility Bed Mobility Overal bed mobility: Needs Assistance Bed Mobility: Rolling, Sidelying to Sit, Sit to Sidelying Rolling:  Supervision Sidelying to sit: Supervision     Sit to sidelying: Supervision General bed mobility comments: heavy use of bed rails, good log roll technique displayed    Transfers Overall transfer level: Needs assistance Equipment used: None Transfers: Sit to/from Stand Sit to Stand: Contact guard assist           General transfer comment: CGA for safety, able to ambulate to bathroom and back      Balance Overall balance assessment: Needs assistance Sitting-balance support: Feet supported, No upper extremity supported Sitting balance-Leahy Scale: Fair     Standing balance support: No upper extremity supported, During functional activity Standing balance-Leahy Scale: Fair Standing balance comment: did not challenge balance                           ADL either performed or assessed with clinical judgement   ADL Overall ADL's : Needs assistance/impaired Eating/Feeding: Set up;Sitting   Grooming: Set up;Sitting   Upper Body Bathing: Set up;Sitting   Lower Body Bathing: Minimal assistance;Sit to/from stand;Sitting/lateral leans   Upper Body Dressing : Set up;Sitting   Lower Body Dressing: Minimal assistance;Sitting/lateral leans;Sit to/from stand Lower Body Dressing Details (indicate cue type and reason): unable to complete figure 4, usually props his legs up due to body habitus Toilet Transfer: Contact guard assist;Ambulation;Regular Toilet   Toileting- Clothing Manipulation and Hygiene: Contact guard assist;Sitting/lateral lean;Sit to/from stand Toileting - Clothing Manipulation Details (indicate cue type and reason): education provided with regard to peri-care after BM     Functional mobility during ADLs: Contact guard assist;Cueing for sequencing;Cueing for safety General ADL Comments: Patient presenting with 9/10 back pain however able to complete bed mobility at supervision level. Patient with need for  contact gaurd assist for ADL management and  transfers. Verbally reveiwed back precautions and brace donning, but did not don prior to short trip to the bathroom. Educated that patient should have brace on for longer bouts of ambulation. OT anticipates no need for OT at discharge, but will continue to follow acutely.     Vision Baseline Vision/History: 1 Wears glasses Ability to See in Adequate Light: 0 Adequate Patient Visual Report: No change from baseline Vision Assessment?: Wears glasses for driving Additional Comments: no visual changes     Perception Perception: Not tested       Praxis Praxis: Not tested       Pertinent Vitals/Pain Pain Assessment Pain Assessment: 0-10 Pain Score: 9  Pain Location: back and right hip Pain Descriptors / Indicators: Discomfort, Guarding, Grimacing Pain Intervention(s): Limited activity within patient's tolerance, Monitored during session, Repositioned, Patient requesting pain meds-RN notified     Extremity/Trunk Assessment Upper Extremity Assessment Upper Extremity Assessment: Overall WFL for tasks assessed;Right hand dominant   Lower Extremity Assessment Lower Extremity Assessment: Defer to PT evaluation   Cervical / Trunk Assessment Cervical / Trunk Assessment: Back Surgery   Communication Communication Communication: No apparent difficulties Cueing Techniques: Verbal cues   Cognition Arousal: Alert Behavior During Therapy: WFL for tasks assessed/performed Overall Cognitive Status: Within Functional Limits for tasks assessed                                       General Comments  VSS, wound vac disconnected, OT reconnected and alerted RN at end of session    Exercises     Shoulder Instructions      Home Living Family/patient expects to be discharged to:: Private residence Living Arrangements: Alone Available Help at Discharge:  (No one to assist) Type of Home: Mobile home Home Access: Ramped entrance     Home Layout: One level     Bathroom  Shower/Tub: Chief Strategy Officer: Standard     Home Equipment: Teacher, English as a foreign language (2 wheels)          Prior Functioning/Environment Prior Level of Function : Independent/Modified Independent;Driving;History of Falls (last six months) (1 fall, tripped on a piece of rebar)             Mobility Comments: independent ADLs Comments: independent, driving, managing finances        OT Problem List: Decreased range of motion;Decreased activity tolerance;Impaired balance (sitting and/or standing);Decreased coordination;Decreased knowledge of precautions;Pain      OT Treatment/Interventions: Self-care/ADL training;Therapeutic exercise;Energy conservation;DME and/or AE instruction;Manual therapy;Therapeutic activities;Patient/family education;Balance training    OT Goals(Current goals can be found in the care plan section) Acute Rehab OT Goals Patient Stated Goal: to be in less pain OT Goal Formulation: With patient Time For Goal Achievement: 01/13/23 Potential to Achieve Goals: Good  OT Frequency: Min 1X/week    Co-evaluation              AM-PAC OT "6 Clicks" Daily Activity     Outcome Measure Help from another person eating meals?: A Little Help from another person taking care of personal grooming?: A Little Help from another person toileting, which includes using toliet, bedpan, or urinal?: A Little Help from another person bathing (including washing, rinsing, drying)?: A Little Help from another person to put on and taking off regular upper body clothing?: A Little Help from another person to put on and taking off  regular lower body clothing?: A Little 6 Click Score: 18   End of Session Nurse Communication: Mobility status;Patient requests pain meds  Activity Tolerance: Patient tolerated treatment well Patient left: in bed;with call bell/phone within reach;with bed alarm set  OT Visit Diagnosis: Unsteadiness on feet (R26.81);Other abnormalities of  gait and mobility (R26.89);History of falling (Z91.81);Pain                Time: 7829-5621 OT Time Calculation (min): 17 min Charges:  OT General Charges $OT Visit: 1 Visit OT Evaluation $OT Eval Moderate Complexity: 1 Mod  Pollyann Glen E. Holy [place], OTR/L Acute Rehabilitation Services [phone]   Cherlyn Cushing 12/30/2022, 9:20 AM

## 2022-12-30 NOTE — Progress Notes (Signed)
  NEUROSURGERY PROGRESS NOTE   Pt with appropriate back pain. Feels "steady" on his feet, much better than preop  EXAM:  BP 139/73 (BP Location: Left Arm)   Pulse 94   Temp 98.2 F (36.8 C) (Oral)   Resp 18   Ht 5\' 5"  (1.651 m)   Wt (!) 158.8 kg   SpO2 94%   BMI 58.24 kg/m   Awake, alert CN intact Good strength BLE Vac in place, maintaining suction  IMPRESSION:  52 y.o. male  POD#1 L4-5 decompression/fusion, doing well  PLAN: - d/c home today - Will get HHPT/OT   Lisbeth Renshaw, MD [facility]

## 2023-01-05 ENCOUNTER — Other Ambulatory Visit: Payer: Self-pay

## 2023-01-05 ENCOUNTER — Other Ambulatory Visit (HOSPITAL_COMMUNITY): Payer: Self-pay

## 2023-01-05 ENCOUNTER — Emergency Department (HOSPITAL_COMMUNITY)
Admission: EM | Admit: 2023-01-05 | Discharge: 2023-01-05 | Disposition: A | Discharge status: Home / Self Care | Payer: Medicaid Other | Attending: Emergency Medicine | Admitting: Emergency Medicine

## 2023-01-05 DIAGNOSIS — Z7984 Long term (current) use of oral hypoglycemic drugs: Secondary | ICD-10-CM | POA: Insufficient documentation

## 2023-01-05 DIAGNOSIS — G8918 Other acute postprocedural pain: Secondary | ICD-10-CM | POA: Diagnosis present

## 2023-01-05 DIAGNOSIS — Z79899 Other long term (current) drug therapy: Secondary | ICD-10-CM | POA: Diagnosis not present

## 2023-01-05 DIAGNOSIS — M549 Dorsalgia, unspecified: Secondary | ICD-10-CM | POA: Diagnosis not present

## 2023-01-05 DIAGNOSIS — I10 Essential (primary) hypertension: Secondary | ICD-10-CM | POA: Insufficient documentation

## 2023-01-05 DIAGNOSIS — E119 Type 2 diabetes mellitus without complications: Secondary | ICD-10-CM | POA: Insufficient documentation

## 2023-01-05 LAB — BASIC METABOLIC PANEL
Anion gap: 15 (ref 5–15)
BUN: 19 mg/dL (ref 6–20)
CO2: 25 mmol/L (ref 22–32)
Calcium: 9.8 mg/dL (ref 8.9–10.3)
Chloride: 94 mmol/L — ABNORMAL LOW (ref 98–111)
Creatinine, Ser: 1.07 mg/dL (ref 0.61–1.24)
GFR, Estimated: >60 mL/min (ref >60)
Glucose, Bld: 137 mg/dL — ABNORMAL HIGH (ref 70–99)
Potassium: 3.4 mmol/L — ABNORMAL LOW (ref 3.5–5.1)
Sodium: 134 mmol/L — ABNORMAL LOW (ref 135–145)

## 2023-01-05 LAB — CBC
HCT: 42.1 % (ref 39.0–52.0)
Hemoglobin: 13.8 g/dL (ref 13.0–17.0)
MCH: 28.9 pg (ref 26.0–34.0)
MCHC: 32.8 g/dL (ref 30.0–36.0)
MCV: 88.3 fL (ref 80.0–100.0)
Platelets: 302 10*3/uL (ref 150–400)
RBC: 4.77 MIL/uL (ref 4.22–5.81)
RDW: 13.5 % (ref 11.5–15.5)
WBC: 13.5 10*3/uL — ABNORMAL HIGH (ref 4.0–10.5)
nRBC: 0 % (ref 0.0–0.2)

## 2023-01-05 MED ORDER — OXYCODONE-ACETAMINOPHEN 10-325 MG PO TABS: 1.0000 | ORAL_TABLET | Freq: Four times a day (QID) | ORAL | 0 refills | Status: DC | PRN

## 2023-01-05 MED ORDER — MORPHINE SULFATE (PF) 4 MG/ML IV SOLN
4.0000 mg | Freq: Once | INTRAVENOUS | Status: AC
  Administered 2023-01-05: 4 mg via INTRAVENOUS
  Filled 2023-01-05: qty 1

## 2023-01-05 MED ORDER — OXYCODONE-ACETAMINOPHEN 10-325 MG PO TABS
1.0000 | ORAL_TABLET | Freq: Four times a day (QID) | ORAL | 0 refills | Status: DC | PRN
  Filled 2023-01-05 (×2): qty 20, 5d supply, fill #0

## 2023-01-05 MED ORDER — METHYLPREDNISOLONE 4 MG PO TBPK: ORAL_TABLET | ORAL | 0 refills | Status: DC

## 2023-01-05 MED ORDER — METHYLPREDNISOLONE 4 MG PO TBPK
ORAL_TABLET | ORAL | 0 refills | Status: DC
  Filled 2023-01-05: qty 21, 6d supply, fill #0

## 2023-01-05 NOTE — Discharge Instructions (Signed)
Please use Tylenol or ibuprofen for pain.  You may use 600 mg ibuprofen every 6 hours or 1000 mg of Tylenol every 6 hours.  You may choose to alternate between the 2.  This would be most effective.  Not to exceed 4 g of Tylenol within 24 hours.  Not to exceed 3200 mg ibuprofen 24 hours.  You can take the stronger pain medication in place of tylenol above for severe breakthrough pain. Please take the entire steroid pack we have prescribed in addition to the above.

## 2023-01-05 NOTE — Care Management (Addendum)
Patient was discharged on 12/20  post back surgery, has OP PT OT orders in . Reached out again to see if we can secure Home health for patient.    1120 Have not received offers for home health. Patient is set up for outpatient PT OT

## 2023-01-05 NOTE — ED Provider Notes (Signed)
[place] EMERGENCY DEPARTMENT AT [facility] Provider Note   CSN: [id number] Arrival date & time: 01/05/23  0865     History  Chief Complaint  Patient presents with   Back Pain    John Cook is a 52 y.o. male past medical history significant for hypertension, diabetes, obesity who presents with concern for postoperative pain in the lumbar spinal region with shooting pain going down his right leg.  Patient reports he ran out of his home oxycodone 2 days ago and the pain has been worse since then.  He denies any numbness, tingling, he reports he was not having the shooting pain down the right leg prior to today.   Back Pain      Home Medications Prior to Admission medications   Medication Sig Start Date End Date Taking? Authorizing Provider  methylPREDNISolone (MEDROL DOSEPAK) 4 MG TBPK tablet Please take the Dosepak as described on package instructions. 01/05/23  Yes Debar Plate H, PA-C  oxyCODONE-acetaminophen (PERCOCET) 10-325 MG tablet Take 1 tablet by mouth every 6 (six) hours as needed for pain. 01/05/23  Yes Jahfari Ambers H, PA-C  acetaminophen (TYLENOL) 500 MG tablet Take 500 mg by mouth every 6 (six) hours as needed for mild pain or moderate pain.    [provider]  atenolol (TENORMIN) 50 MG tablet Take 50 mg by mouth daily.    [provider]  celecoxib (CELEBREX) 200 MG capsule Take 200 mg by mouth 2 (two) times daily. 09/01/22   [provider]  DULoxetine (CYMBALTA) 30 MG capsule Take 30 mg by mouth 2 (two) times daily. 05/05/22   [provider]  fenofibrate (TRICOR) 145 MG tablet Take 145 mg by mouth daily. 09/01/22   [provider]  gabapentin (NEURONTIN) 300 MG capsule Take 600 mg by mouth 2 (two) times daily.    [provider]  metFORMIN (GLUCOPHAGE) 500 MG tablet Take 500 mg by mouth daily.    [provider]  methocarbamol (ROBAXIN) 500 MG tablet Take 1 tablet (500 mg total)  by mouth every 6 (six) hours as needed for muscle spasms. 12/30/22   Lisbeth Renshaw, MD  omeprazole (PRILOSEC) 20 MG capsule Take 20 mg by mouth in the morning and at bedtime.    [provider]  oxybutynin (DITROPAN) 5 MG tablet Take 5 mg by mouth daily.    [provider]  oxyCODONE 10 MG TABS Take 1 tablet (10 mg total) by mouth every 6 (six) hours as needed for up to 7 days for severe pain (pain score 7-10). 12/30/22 01/06/23  Lisbeth Renshaw, MD  PROVENTIL HFA 108 [phone] Base) MCG/ACT inhaler Inhale 2 puffs into the lungs every 4 (four) hours as needed for shortness of breath or wheezing. 10/02/17   [provider]  rosuvastatin (CRESTOR) 20 MG tablet Take 20 mg by mouth at bedtime. 09/01/22   [provider]  triamterene-hydrochlorothiazide (MAXZIDE-25) 37.5-25 MG tablet Take 1 tablet by mouth daily.    [provider]      Allergies    Lisinopril    Review of Systems   Review of Systems  Musculoskeletal:  Positive for back pain.  All other systems reviewed and are negative.   Physical Exam Updated Vital Signs BP 125/70 (BP Location: Left Arm)   Pulse 74   Temp 98.3 F (36.8 C) (Oral)   Resp (!) 22   Ht 5\' 5"  (1.651 m)   Wt (!) 158.8 kg   SpO2 99%  BMI 58.24 kg/m  Physical Exam Vitals and nursing note reviewed.  Constitutional:      General: He is not in acute distress.    Appearance: Normal appearance.  HENT:     Head: Normocephalic and atraumatic.  Eyes:     General:        Right eye: No discharge.        Left eye: No discharge.  Cardiovascular:     Rate and Rhythm: Normal rate and regular rhythm.     Heart sounds: No murmur heard.    No friction rub. No gallop.  Pulmonary:     Effort: Pulmonary effort is normal.     Breath sounds: Normal breath sounds.  Abdominal:     General: Bowel sounds are normal.     Palpations: Abdomen is soft.  Skin:    General: Skin is warm and dry.     Capillary Refill: Capillary  refill takes less than 2 seconds.     Comments: Incision site is clean, with crusted scab  Neurological:     Mental Status: He is alert and oriented to person, place, and time.  Psychiatric:        Mood and Affect: Mood normal.        Behavior: Behavior normal.     ED Results / Procedures / Treatments   Labs (all labs ordered are listed, but only abnormal results are displayed) Labs Reviewed  CBC - Abnormal; Notable for the following components:      Result Value   WBC 13.5 (*)    All other components within normal limits  BASIC METABOLIC PANEL - Abnormal; Notable for the following components:   Sodium 134 (*)    Potassium 3.4 (*)    Chloride 94 (*)    Glucose, Bld 137 (*)    All other components within normal limits    EKG None  Radiology No results found.  Procedures Procedures    Medications Ordered in ED Medications  morphine (PF) 4 MG/ML injection 4 mg (4 mg Intravenous Given 01/05/23 0954)    ED Course/ Medical Decision Making/ A&P                                 Medical Decision Making  This patient is a 52 y.o. male  who presents to the ED for concern of back pain.   Differential diagnoses prior to evaluation: The emergent differential diagnosis includes, but is not limited to, postoperative pain, postoperative infection, perioperative spinal cord damage although I think less likely considering pain was well-controlled at time of discharge with no neurologic deficits,. This is not an exhaustive differential.   Past Medical History / Co-morbidities / Social History: Obesity, hypertension, hyperlipidemia  Additional history: Chart reviewed. Pertinent results include: I reviewed his postoperative note, patient had a unremarkable operative course, was discharged with 20 mg of oxycodone which she reports that he is finished taking at this time.  Physical Exam: Physical exam performed. The pertinent findings include: Postoperative site appears  appropriately healed, no evidence of secondary infection, he is intact strength 5/5 bilateral lower extremities and normal sensation throughout.  Lab Tests/Imaging studies: I personally interpreted labs/imaging and the pertinent results include: CBC with mild leukocytosis likely postoperative, no other significant abnormalities, BMP unremarkable.  Considered advanced imaging of the spine but after speaking with the neurosurgeon and examining the patient given that he has no focal neurologic deficits, no signs  of secondary infection I do not think that advanced imaging is needed at this time to look for a postoperative complication, more likely patient simply out of pain medication after back surgery and needing refills prior to neurosurgical reevaluation..   Medications: I ordered medication including morphine for pain.  I have reviewed the patients home medicines and have made adjustments as needed.   Consults: I spoke with the neurosurgeon, Dr. Lovell Sheehan, and after discussing patient's clinical condition and recent operative course he agrees with plan to discharge, close neurosurgical follow-up, we will attempt to schedule him home health, refill his Percocet, and on neurosurgery recommendations additionally will provide him a Medrol Dosepak.  Return precautions given, patient understands agrees to plan.  Disposition: After consideration of the diagnostic results and the patients response to treatment, I feel that patient is stable for discharge, social work consulted to help establish home health if possible, although his Medicaid seems only to be covering outpatient physical therapy based on most recent evaluation.Marland Kitchen   emergency department workup does not suggest an emergent condition requiring admission or immediate intervention beyond what has been performed at this time. The plan is: as above. The patient is safe for discharge and has been instructed to return immediately for worsening symptoms,  change in symptoms or any other concerns.  Final Clinical Impression(s) / ED Diagnoses Final diagnoses:  Post-op pain    Rx / DC Orders ED Discharge Orders          Ordered    methylPREDNISolone (MEDROL DOSEPAK) 4 MG TBPK tablet        01/05/23 1111    oxyCODONE-acetaminophen (PERCOCET) 10-325 MG tablet  Every 6 hours PRN        01/05/23 1111              Siearra Amberg, [place] H, PA-C 01/05/23 1111    Margarita Grizzle, MD 01/05/23 1549

## 2023-01-05 NOTE — ED Notes (Signed)
Incision assessed by this RN and PA. Edges approximated, no obvious sign of infection.

## 2023-01-05 NOTE — Progress Notes (Signed)
   Providing Compassionate, Quality Care - Together   Subjective: John Cook underwent L4-5 fusion by dr. Conchita Paris on 12/29/2022. He reports increased back pain since her ran out of pain medication. He tells me he forgot to request a refill because he had "a lot going on."  Objective: Vital signs in last 24 hours: Temp:  [98.3 F (36.8 C)] 98.3 F (36.8 C) (12/26 0933) Pulse Rate:  [74] 74 (12/26 0933) Resp:  [22] 22 (12/26 0933) BP: (125)/(70) 125/70 (12/26 0933) SpO2:  [99 %] 99 % (12/26 0933) Weight:  [158.8 kg] 158.8 kg (12/26 0931)  Intake/Output from previous day: No intake/output data recorded. Intake/Output this shift: No intake/output data recorded.  Alert and oriented x 4 PERRLA CN II-XII grossly intact MAE, Strength and sensation intact Incision is closed with staples. There is dried sanguinous dressing at the incision, but no s/s of infection.   Lab Results: Recent Labs    01/05/23 0939  WBC 13.5*  HGB 13.8  HCT 42.1  PLT 302   BMET Recent Labs    01/05/23 0939  NA 134*  K 3.4*  CL 94*  CO2 25  GLUCOSE 137*  BUN 19  CREATININE 1.07  CALCIUM 9.8    Studies/Results: No results found.  Assessment/Plan: John Cook will be discharged with pain medication and a Medrol Dosepak. He has follow up scheduled with Dr. Conchita Paris as an outpatient.   LOS: 0 days     Val Eagle, DNP, AGNP-C Nurse Practitioner  [facility] [address], [place], [place] [address] P: [phone]    F: [phone]  01/05/2023, 11:26 AM

## 2023-01-05 NOTE — ED Notes (Signed)
Patient ambulated to urinate by this RN

## 2023-01-05 NOTE — ED Triage Notes (Addendum)
Patient arrives from home via [facility] for back pain post operation. Patient had spinal fusion of L4 and L5 on 12/19, ran out of pain meds 2 days ago. Patient endorses right leg weakness and pain, associated symptoms pre-procedure. Patient at home walked to stretcher, in back brace. 20 LAC, given 4mg  morphine, endorses right thigh poking sensation with tingling. Follow up with neuro 1/9. Alert and oriented x4.   VSS Last BP 119/63 [id number]

## 2023-01-06 ENCOUNTER — Encounter (HOSPITAL_COMMUNITY): Payer: Self-pay | Admitting: *Deleted

## 2023-01-06 ENCOUNTER — Emergency Department (HOSPITAL_COMMUNITY)
Admission: EM | Admit: 2023-01-06 | Discharge: 2023-01-11 | Disposition: A | Discharge status: Home / Self Care | Payer: Medicaid Other | Attending: Emergency Medicine | Admitting: Emergency Medicine

## 2023-01-06 ENCOUNTER — Other Ambulatory Visit: Payer: Self-pay

## 2023-01-06 DIAGNOSIS — M549 Dorsalgia, unspecified: Secondary | ICD-10-CM | POA: Diagnosis present

## 2023-01-06 DIAGNOSIS — M545 Low back pain, unspecified: Secondary | ICD-10-CM

## 2023-01-06 NOTE — ED Triage Notes (Signed)
Pt BIB [facility] for continued back pain, s/p back surgery on 12/19 and released on 12/20.  States pain is getting worse and pt states he is not able to take care of himself.

## 2023-01-07 LAB — CBC
HCT: 40.8 % (ref 39.0–52.0)
Hemoglobin: 13.3 g/dL (ref 13.0–17.0)
MCH: 28.8 pg (ref 26.0–34.0)
MCHC: 32.6 g/dL (ref 30.0–36.0)
MCV: 88.3 fL (ref 80.0–100.0)
Platelets: 296 10*3/uL (ref 150–400)
RBC: 4.62 MIL/uL (ref 4.22–5.81)
RDW: 13.9 % (ref 11.5–15.5)
WBC: 10.8 10*3/uL — ABNORMAL HIGH (ref 4.0–10.5)
nRBC: 0 % (ref 0.0–0.2)

## 2023-01-07 LAB — BASIC METABOLIC PANEL
Anion gap: 8 (ref 5–15)
BUN: 22 mg/dL — ABNORMAL HIGH (ref 6–20)
CO2: 27 mmol/L (ref 22–32)
Calcium: 8.9 mg/dL (ref 8.9–10.3)
Chloride: 95 mmol/L — ABNORMAL LOW (ref 98–111)
Creatinine, Ser: 1.16 mg/dL (ref 0.61–1.24)
GFR, Estimated: >60 mL/min (ref >60)
Glucose, Bld: 127 mg/dL — ABNORMAL HIGH (ref 70–99)
Potassium: 2.7 mmol/L — CL (ref 3.5–5.1)
Sodium: 130 mmol/L — ABNORMAL LOW (ref 135–145)

## 2023-01-07 LAB — MAGNESIUM: Magnesium: 1.9 mg/dL (ref 1.7–2.4)

## 2023-01-07 MED ORDER — TRIAMTERENE-HCTZ 37.5-25 MG PO TABS
1.0000 | ORAL_TABLET | Freq: Every day | ORAL | Status: DC
  Administered 2023-01-08 – 2023-01-11 (×4): 1 via ORAL
  Filled 2023-01-07 (×6): qty 1

## 2023-01-07 MED ORDER — OXYCODONE-ACETAMINOPHEN 5-325 MG PO TABS
2.0000 | ORAL_TABLET | Freq: Once | ORAL | Status: AC
  Administered 2023-01-07: 2 via ORAL
  Filled 2023-01-07: qty 2

## 2023-01-07 MED ORDER — OXYBUTYNIN CHLORIDE 5 MG PO TABS
5.0000 mg | ORAL_TABLET | Freq: Every day | ORAL | Status: DC
  Administered 2023-01-07 – 2023-01-11 (×5): 5 mg via ORAL
  Filled 2023-01-07 (×5): qty 1

## 2023-01-07 MED ORDER — POTASSIUM CHLORIDE CRYS ER 20 MEQ PO TBCR
40.0000 meq | EXTENDED_RELEASE_TABLET | ORAL | Status: AC
  Administered 2023-01-07 (×3): 40 meq via ORAL
  Filled 2023-01-07: qty 4
  Filled 2023-01-07 (×2): qty 2

## 2023-01-07 MED ORDER — ATENOLOL 25 MG PO TABS
50.0000 mg | ORAL_TABLET | Freq: Every day | ORAL | Status: DC
  Administered 2023-01-07 – 2023-01-11 (×5): 50 mg via ORAL
  Filled 2023-01-07 (×5): qty 2

## 2023-01-07 MED ORDER — OXYCODONE-ACETAMINOPHEN 5-325 MG PO TABS
1.0000 | ORAL_TABLET | Freq: Four times a day (QID) | ORAL | Status: DC | PRN
  Administered 2023-01-08 – 2023-01-10 (×9): 1 via ORAL
  Filled 2023-01-07 (×9): qty 1

## 2023-01-07 MED ORDER — METFORMIN HCL 500 MG PO TABS
500.0000 mg | ORAL_TABLET | Freq: Every day | ORAL | Status: DC
  Administered 2023-01-07 – 2023-01-11 (×5): 500 mg via ORAL
  Filled 2023-01-07 (×5): qty 1

## 2023-01-07 MED ORDER — PANTOPRAZOLE SODIUM 40 MG PO TBEC
40.0000 mg | DELAYED_RELEASE_TABLET | Freq: Every day | ORAL | Status: DC
Start: 2023-01-07 — End: 2023-01-11
  Administered 2023-01-07 – 2023-01-11 (×5): 40 mg via ORAL
  Filled 2023-01-07 (×5): qty 1

## 2023-01-07 MED ORDER — ALBUTEROL SULFATE HFA 108 (90 BASE) MCG/ACT IN AERS: 2.0000 | INHALATION_SPRAY | RESPIRATORY_TRACT | Status: DC | PRN

## 2023-01-07 MED ORDER — GABAPENTIN 300 MG PO CAPS
600.0000 mg | ORAL_CAPSULE | Freq: Two times a day (BID) | ORAL | Status: DC
  Administered 2023-01-07 – 2023-01-11 (×9): 600 mg via ORAL
  Filled 2023-01-07 (×9): qty 2

## 2023-01-07 MED ORDER — DULOXETINE HCL 30 MG PO CPEP
30.0000 mg | ORAL_CAPSULE | Freq: Two times a day (BID) | ORAL | Status: DC
  Administered 2023-01-07 – 2023-01-11 (×9): 30 mg via ORAL
  Filled 2023-01-07 (×9): qty 1

## 2023-01-07 MED ORDER — ROSUVASTATIN CALCIUM 20 MG PO TABS
20.0000 mg | ORAL_TABLET | Freq: Every day | ORAL | Status: DC
  Administered 2023-01-07 – 2023-01-10 (×4): 20 mg via ORAL
  Filled 2023-01-07 (×4): qty 1

## 2023-01-07 MED ORDER — METHOCARBAMOL 500 MG PO TABS
500.0000 mg | ORAL_TABLET | Freq: Four times a day (QID) | ORAL | Status: DC | PRN
  Administered 2023-01-07 – 2023-01-11 (×7): 500 mg via ORAL
  Filled 2023-01-07 (×7): qty 1

## 2023-01-07 MED ORDER — OXYCODONE HCL 5 MG PO TABS
5.0000 mg | ORAL_TABLET | Freq: Four times a day (QID) | ORAL | Status: DC | PRN
  Administered 2023-01-07 – 2023-01-11 (×7): 5 mg via ORAL
  Filled 2023-01-07 (×7): qty 1

## 2023-01-07 MED ORDER — OXYCODONE-ACETAMINOPHEN 10-325 MG PO TABS: 1.0000 | ORAL_TABLET | Freq: Four times a day (QID) | ORAL | Status: DC | PRN

## 2023-01-07 MED ORDER — FENOFIBRATE 160 MG PO TABS
160.0000 mg | ORAL_TABLET | Freq: Every day | ORAL | Status: DC
Start: 2023-01-07 — End: 2023-01-11
  Administered 2023-01-07 – 2023-01-11 (×5): 160 mg via ORAL
  Filled 2023-01-07 (×5): qty 1

## 2023-01-07 NOTE — Evaluation (Signed)
Physical Therapy Evaluation Patient Details Name: John Cook MRN: [id number] DOB: Sep 24, 1970 Today's Date: 01/07/2023  History of Present Illness  John Cook is a 52 y.o. male.     Patient is a 52 year old male with past medical history of morbid obesity, hyperlipidemia, hypertension, type 2 diabetes, GERD.  Patient recently underwent lumbar fusion surgery approximately 1 week ago.  Patient has been having pain since the surgery that is limiting his function at home.  He lives by himself with no family or friends nearby to help him and he feels as though he cannot function by himself.  He states he is in too much pain to take care of of his daily requirements and when he takes his pain medicine he feels unwell.  Patient was here 2 days ago with similar complaints, but was discharged.  He feels as though he is unsafe at home and is requesting placement in a rehab facility.     The history is provided by the patient.   Clinical Impression  Patient demonstrates slow labored movement for getting into/out of bed with good return for log rolling to side and sitting up from side lying position with Min assist, slow labored movement during ambulation and limited mostly due to c/o fatigue and increasing low back pain with radiation down RLE.  Patient tolerated sitting up at bedside after therapy. Patient will benefit from continued skilled physical therapy in hospital and recommended venue below to increase strength, balance, endurance for safe ADLs and gait.          If plan is discharge home, recommend the following: A little help with walking and/or transfers;A little help with bathing/dressing/bathroom;Assistance with cooking/housework;Help with stairs or ramp for entrance   Can travel by private vehicle   Yes    Equipment Recommendations None recommended by PT  Recommendations for Other Services       Functional Status Assessment Patient has had a recent decline in their functional status and  demonstrates the ability to make significant improvements in function in a reasonable and predictable amount of time.     Precautions / Restrictions Precautions Precautions: Fall Spinal Brace: Lumbar corset Restrictions Weight Bearing Restrictions Per Provider Order: No      Mobility  Bed Mobility Overal bed mobility: Needs Assistance Bed Mobility: Rolling, Sidelying to Sit, Sit to Sidelying Rolling: Supervision Sidelying to sit: Min assist     Sit to sidelying: Min assist General bed mobility comments: slightly labored movement    Transfers Overall transfer level: Needs assistance Equipment used: Rolling walker (2 wheels) Transfers: Sit to/from Stand, Bed to chair/wheelchair/BSC Sit to Stand: Contact guard assist   Step pivot transfers: Contact guard assist, Min assist       General transfer comment: slow labored movement    Ambulation/Gait Ambulation/Gait assistance: Contact guard assist, Min assist Gait Distance (Feet): 40 Feet Assistive device: Rolling walker (2 wheels) Gait Pattern/deviations: Decreased step length - right, Decreased step length - left, Decreased stride length, Decreased stance time - right, Trunk flexed Gait velocity: decreased     General Gait Details: slow slightly labored movement without loss of balance, limited mostly due to fatigue and low back pain with radiation down RLE  Stairs            Wheelchair Mobility     Tilt Bed    Modified Rankin (Stroke Patients Only)       Balance Overall balance assessment: Needs assistance   Sitting balance-Leahy Scale: Good Sitting balance -  Comments: seated at EOB   Standing balance support: Reliant on assistive device for balance, During functional activity, Bilateral upper extremity supported Standing balance-Leahy Scale: Fair Standing balance comment: using RW                             Pertinent Vitals/Pain Pain Assessment Pain Assessment: 0-10 Pain Score: 8   Pain Location: low back with radiation to RLE Pain Descriptors / Indicators: Radiating, Sharp, Sore, Tingling, Numbness Pain Intervention(s): Limited activity within patient's tolerance, Monitored during session, Repositioned    Home Living Family/patient expects to be discharged to:: Private residence Living Arrangements: Alone Available Help at Discharge:  (Patient states he has no help) Type of Home: Mobile home Home Access: Ramped entrance;Other (comment) (states 1 step to enter)       Home Layout: One level Home Equipment: BSC/3in1      Prior Function Prior Level of Function : Independent/Modified Independent;Driving;History of Falls (last six months)               ADLs Comments: Independent     Extremity/Trunk Assessment   Upper Extremity Assessment Upper Extremity Assessment: Generalized weakness    Lower Extremity Assessment Lower Extremity Assessment: Generalized weakness    Cervical / Trunk Assessment Cervical / Trunk Assessment: Kyphotic  Communication   Communication Communication: No apparent difficulties  Cognition Arousal: Alert Behavior During Therapy: WFL for tasks assessed/performed Overall Cognitive Status: Within Functional Limits for tasks assessed                                          General Comments      Exercises     Assessment/Plan    PT Assessment Patient needs continued PT services  PT Problem List         PT Treatment Interventions DME instruction;Stair training;Gait training;Functional mobility training;Therapeutic activities;Therapeutic exercise;Balance training;Patient/family education    PT Goals (Current goals can be found in the Care Plan section)  Acute Rehab PT Goals Patient Stated Goal: return home after rehab PT Goal Formulation: With patient Time For Goal Achievement: 01/21/23 Potential to Achieve Goals: Good    Frequency Min 2X/week     Co-evaluation               AM-PAC  PT "6 Clicks" Mobility  Outcome Measure Help needed turning from your back to your side while in a flat bed without using bedrails?: A Little Help needed moving from lying on your back to sitting on the side of a flat bed without using bedrails?: A Little Help needed moving to and from a bed to a chair (including a wheelchair)?: A Little Help needed standing up from a chair using your arms (e.g., wheelchair or bedside chair)?: A Little Help needed to walk in hospital room?: A Little Help needed climbing 3-5 steps with a railing? : A Lot 6 Click Score: 17    End of Session Equipment Utilized During Treatment: Back brace Activity Tolerance: Patient tolerated treatment well;Patient limited by fatigue;Patient limited by pain Patient left: in chair;with call bell/phone within reach Nurse Communication: Mobility status PT Visit Diagnosis: Unsteadiness on feet (R26.81);Other abnormalities of gait and mobility (R26.89);Muscle weakness (generalized) (M62.81) Pain - part of body: Leg (low back with radiation to RLE)    Time: 1610-9604 PT Time Calculation (min) (ACUTE ONLY): 27 min  Charges:   PT Evaluation $PT Eval Moderate Complexity: 1 Mod PT Treatments $Therapeutic Activity: 23-37 mins PT General Charges $$ ACUTE PT VISIT: 1 Visit         10:37 AM, 01/07/23 Ocie Bob, MPT Physical Therapist with [phone] office [phone] mobile phone

## 2023-01-07 NOTE — ED Provider Notes (Signed)
[place] [facility] AT [facility] Provider Note   CSN: [id number] Arrival date & time: 01/06/23  1611     History  Chief Complaint  Patient presents with   Back Pain    John Cook is a 52 y.o. male.  Patient is a 52 year old male with past medical history of morbid obesity, hyperlipidemia, hypertension, type 2 diabetes, GERD.  Patient recently underwent lumbar fusion surgery approximately 1 week ago.  Patient has been having pain since the surgery that is limiting his function at home.  He lives by himself with no family or friends nearby to help him and he feels as though he cannot function by himself.  He states he is in too much pain to take care of of his daily requirements and when he takes his pain medicine he feels unwell.  Patient was here 2 days ago with similar complaints, but was discharged.  He feels as though he is unsafe at home and is requesting placement in a rehab facility.  The history is provided by the patient.       Home Medications Prior to Admission medications   Medication Sig Start Date End Date Taking? Authorizing Provider  acetaminophen (TYLENOL) 500 MG tablet Take 500 mg by mouth every 6 (six) hours as needed for mild pain or moderate pain.    [provider]  atenolol (TENORMIN) 50 MG tablet Take 50 mg by mouth daily.    [provider]  celecoxib (CELEBREX) 200 MG capsule Take 200 mg by mouth 2 (two) times daily. 09/01/22   [provider]  DULoxetine (CYMBALTA) 30 MG capsule Take 30 mg by mouth 2 (two) times daily. 05/05/22   [provider]  fenofibrate (TRICOR) 145 MG tablet Take 145 mg by mouth daily. 09/01/22   [provider]  gabapentin (NEURONTIN) 300 MG capsule Take 600 mg by mouth 2 (two) times daily.    [provider]  metFORMIN (GLUCOPHAGE) 500 MG tablet Take 500 mg by mouth daily.    [provider]  methocarbamol (ROBAXIN) 500 MG tablet Take 1 tablet (500  mg total) by mouth every 6 (six) hours as needed for muscle spasms. 12/30/22   Lisbeth Renshaw, MD  methylPREDNISolone (MEDROL DOSEPAK) 4 MG TBPK tablet Please take the Dosepak as described on package instructions. 01/05/23   Val Eagle D, NP  omeprazole (PRILOSEC) 20 MG capsule Take 20 mg by mouth in the morning and at bedtime.    [provider]  oxybutynin (DITROPAN) 5 MG tablet Take 5 mg by mouth daily.    [provider]  oxyCODONE-acetaminophen (PERCOCET) 10-325 MG tablet Take 1 tablet by mouth every 6 (six) hours as needed for pain. 01/05/23   Val Eagle D, NP  PROVENTIL HFA 108 (90 Base) MCG/ACT inhaler Inhale 2 puffs into the lungs every 4 (four) hours as needed for shortness of breath or wheezing. 10/02/17   [provider]  rosuvastatin (CRESTOR) 20 MG tablet Take 20 mg by mouth at bedtime. 09/01/22   [provider]  triamterene-hydrochlorothiazide (MAXZIDE-25) 37.5-25 MG tablet Take 1 tablet by mouth daily.    [provider]      Allergies    Lisinopril    Review of Systems   Review of Systems  All other systems reviewed and are negative.   Physical Exam Updated Vital Signs BP (!) 153/81 (BP Location: Right Arm)   Pulse 77   Temp 98.2 F (36.8 C) (Oral)  Resp 16   Ht 5\' 5"  (1.651 m)   Wt (!) 161 kg   SpO2 96%   BMI 59.08 kg/m  Physical Exam Vitals and nursing note reviewed.  Constitutional:      Appearance: Normal appearance.  HENT:     Head: Normocephalic.  Pulmonary:     Effort: Pulmonary effort is normal.  Skin:    General: Skin is warm and dry.  Neurological:     Mental Status: He is alert and oriented to person, place, and time.     Comments: Patient moves all 4 extremities without difficulty.  There is no weakness or sensory deficit.     ED Results / Procedures / Treatments   Labs (all labs ordered are listed, but only abnormal results are displayed) Labs Reviewed - No data to  display  EKG None  Radiology No results found.  Procedures Procedures    Medications Ordered in ED Medications - No data to display  ED Course/ Medical Decision Making/ A&P  Patient to remain in the ER as a hold until evaluated by TOC to discuss SNF placement.  Final Clinical Impression(s) / ED Diagnoses Final diagnoses:  None    Rx / DC Orders ED Discharge Orders     None         Geoffery Lyons, MD 01/07/23 0010

## 2023-01-07 NOTE — Plan of Care (Signed)
  Problem: Acute Rehab PT Goals(only PT should resolve) Goal: Pt will Roll Supine to Side Outcome: Progressing Flowsheets (Taken 01/07/2023 1039) Pt will Roll Supine to Side:  with contact guard assist  with modified independence Goal: Pt Will Go Supine/Side To Sit Outcome: Progressing Flowsheets (Taken 01/07/2023 1039) Pt will go Supine/Side to Sit:  with modified independence  with supervision Goal: Pt Will Go Sit To Supine/Side Outcome: Progressing Flowsheets (Taken 01/07/2023 1039) Pt will go Sit to Supine/Side:  with modified independence  with supervision Goal: Patient Will Transfer Sit To/From Stand Outcome: Progressing Flowsheets (Taken 01/07/2023 1039) Patient will transfer sit to/from stand: with contact guard assist Goal: Pt Will Transfer Bed To Chair/Chair To Bed Outcome: Progressing Flowsheets (Taken 01/07/2023 1039) Pt will Transfer Bed to Chair/Chair to Bed: with contact guard assist Goal: Pt Will Ambulate Outcome: Progressing Flowsheets (Taken 01/07/2023 1039) Pt will Ambulate:  75 feet  with contact guard assist  with rolling walker   10:40 AM, 01/07/23 Ocie Bob, MPT Physical Therapist with [facility] [phone] office [phone] mobile phone

## 2023-01-07 NOTE — TOC CM/SW Note (Signed)
Transition of Care [facility]) - Emergency Department Mini Assessment   Patient Details  Name: John Cook MRN: [id number] Date of Birth: 04-18-1970  Transition of Care [facility]) CM/SW Contact:    Armanda Heritage, RN Phone Number: 01/07/2023, 1:53 PM   Clinical Narrative: CM spoke with patient and discussed PT recommendations for SNF, patient is in agreement with initiating a facility search in [facility]. CM discussed that if not facilities make a bed offer, patient will need to discharge to home and [facility] can attempt to secure [facility] services. FL2 has been sent out to all facilities in [facility], [place] screening is currently pending.    ED Mini Assessment: What brought you to the Emergency Department? : pain/mobility issues  Barriers to Discharge: No SNF bed        Interventions which prevented an admission or readmission: Other (must enter comment) (SNF work up)    Patient Mudlogger with: patient/ Dot Been Contact Date: 01/07/23,   Contact time: 1352      Patient states their goals for this hospitalization and ongoing recovery are:: to get rehab CMS Medicare.gov Compare Post Acute Care list provided to:: Patient Choice offered to / list presented to : Patient  Admission diagnosis:  Back Pain Patient Active Problem List   Diagnosis Date Noted   Synovial cyst 12/29/2022   SOB (shortness of breath) 11/17/2017   Excessive daytime sleepiness 11/17/2017   History of snoring 11/17/2017   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 [place] [address] Phone: [phone] Fax: [phone]  [facility] [address], [place] - [place] [address] [place] [address] Phone: [phone] Fax: [phone]  [facility] [address] [place] [address] Phone: [phone] Fax:  [phone]

## 2023-01-07 NOTE — [place] FL2 (Signed)
[place] MEDICAID FL2 LEVEL OF CARE FORM     IDENTIFICATION  Patient Name: John Cook Birthdate: [birth year] Sex: male Admission Date (Current Location): 01/06/2023  [facility] and [place] Number:      Facility and Address:         Provider Number:    Attending Physician Name and Address:  Eber Hong, MD  Relative Name and Phone Number:       Current Level of Care:   Recommended Level of Care:   Prior Approval Number:    Date Approved/Denied:   PASRR Number:    Discharge Plan:      Current Diagnoses: Patient Active Problem List   Diagnosis Date Noted   Synovial cyst 12/29/2022   SOB (shortness of breath) 11/17/2017   Excessive daytime sleepiness 11/17/2017   History of snoring 11/17/2017   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    Orientation RESPIRATION BLADDER Height & Weight            Weight: (!) 161 kg Height:  5\' 5"  (165.1 cm)  BEHAVIORAL SYMPTOMS/MOOD NEUROLOGICAL BOWEL NUTRITION STATUS           AMBULATORY STATUS COMMUNICATION OF NEEDS Skin                               Personal Care Assistance Level of Assistance              Functional Limitations Info             SPECIAL CARE FACTORS FREQUENCY                       Contractures      Additional Factors Info                  Current Medications (01/07/2023):  This is the current hospital active medication list Current Facility-Administered Medications  Medication Dose Route Frequency Provider Last Rate Last Admin   albuterol (VENTOLIN HFA) 108 (90 Base) MCG/ACT inhaler 2 puff  2 puff Inhalation Q4H PRN Eber Hong, MD       atenolol (TENORMIN) tablet 50 mg  50 mg Oral Daily Eber Hong, MD   50 mg at 01/07/23 1030   DULoxetine (CYMBALTA) DR capsule 30 mg  30 mg Oral BID Eber Hong, MD   30 mg at 01/07/23 1110   fenofibrate tablet 160 mg  160 mg Oral Daily Eber Hong, MD   160 mg at 01/07/23 1234   gabapentin (NEURONTIN) capsule 600 mg  600 mg Oral  BID Eber Hong, MD   600 mg at 01/07/23 1031   metFORMIN (GLUCOPHAGE) tablet 500 mg  500 mg Oral Daily Eber Hong, MD   500 mg at 01/07/23 1111   methocarbamol (ROBAXIN) tablet 500 mg  500 mg Oral Q6H PRN Eber Hong, MD   500 mg at 01/07/23 1111   oxybutynin (DITROPAN) tablet 5 mg  5 mg Oral Daily Eber Hong, MD   5 mg at 01/07/23 1111   oxyCODONE-acetaminophen (PERCOCET/ROXICET) 5-325 MG per tablet 1 tablet  1 tablet Oral Q6H PRN Orson Aloe, [facility]       And   oxyCODONE (Oxy IR/ROXICODONE) immediate release tablet 5 mg  5 mg Oral Q6H PRN Orson Aloe, RPH       pantoprazole (PROTONIX) EC tablet 40 mg  40 mg Oral Daily Eber Hong, MD   40 mg at 01/07/23 1031  potassium chloride SA (KLOR-CON M) CR tablet 40 mEq  40 mEq Oral Q3H Eber Hong, MD   40 mEq at 01/07/23 1234   rosuvastatin (CRESTOR) tablet 20 mg  20 mg Oral QHS Eber Hong, MD       triamterene-hydrochlorothiazide [facility]) 37.5-25 MG per tablet 1 tablet  1 tablet Oral Daily Eber Hong, MD       Current Outpatient Medications  Medication Sig Dispense Refill   atenolol (TENORMIN) 50 MG tablet Take 50 mg by mouth daily.     DULoxetine (CYMBALTA) 30 MG capsule Take 30 mg by mouth 2 (two) times daily.     fenofibrate (TRICOR) 145 MG tablet Take 145 mg by mouth daily.     gabapentin (NEURONTIN) 300 MG capsule Take 600 mg by mouth 2 (two) times daily.     metFORMIN (GLUCOPHAGE) 500 MG tablet Take 500 mg by mouth daily.     methocarbamol (ROBAXIN) 500 MG tablet Take 1 tablet (500 mg total) by mouth every 6 (six) hours as needed for muscle spasms. 60 tablet 0   methylPREDNISolone (MEDROL DOSEPAK) 4 MG TBPK tablet Please take the Dosepak as described on package instructions. (Patient taking differently: Take 4-24 mg by mouth See admin instructions. Please take the Dosepak as described on package instructions (6,5,4,3,2,1) Starting on 01/05/2023) 21 each 0   omeprazole (PRILOSEC) 20 MG capsule Take 20 mg by  mouth in the morning and at bedtime.     oxybutynin (DITROPAN) 5 MG tablet Take 5 mg by mouth daily.     oxyCODONE-acetaminophen (PERCOCET) 10-325 MG tablet Take 1 tablet by mouth every 6 (six) hours as needed for pain. 20 tablet 0   rosuvastatin (CRESTOR) 20 MG tablet Take 20 mg by mouth at bedtime.     triamterene-hydrochlorothiazide (MAXZIDE-25) 37.5-25 MG tablet Take 1 tablet by mouth daily.     acetaminophen (TYLENOL) 500 MG tablet Take 500 mg by mouth every 6 (six) hours as needed for mild pain or moderate pain.     celecoxib (CELEBREX) 200 MG capsule Take 200 mg by mouth 2 (two) times daily.     PROVENTIL HFA 108 (90 Base) MCG/ACT inhaler Inhale 2 puffs into the lungs every 4 (four) hours as needed for shortness of breath or wheezing.  0     Discharge Medications: Please see discharge summary for a list of discharge medications.  Relevant Imaging Results:  Relevant Lab Results:   Additional Information    Armanda Heritage, RN

## 2023-01-07 NOTE — ED Notes (Signed)
Pt had a successful BM, the first one since surgery.

## 2023-01-07 NOTE — ED Provider Notes (Signed)
Emergency Medicine Observation Re-evaluation Note  John Cook is a 52 y.o. male, seen on rounds today.  Pt initially presented to the ED for complaints of Back Pain Currently, the patient is resting, has not been seen by physical therapy, this was ordered at the time of change of shift this morning.  I have renewed his medications for his daily medications including antihypertensives and diabetic medicines as well as some pain medicine as needed.  Physical Exam  BP (!) 140/76 (BP Location: Right Arm)   Pulse 70   Temp 98.3 F (36.8 C) (Oral)   Resp 18   Ht 1.651 m (5\' 5" )   Wt (!) 161 kg   SpO2 96%   BMI 59.08 kg/m  Physical Exam General: No distress, Cardiac: No tachycardia Lungs: No hypoxia, normal respiratory rate Psych: Resting comfortably  ED Course / MDM  EKG:   I have reviewed the labs performed to date as well as medications administered while in observation.  Recent changes in the last 24 hours include none.  Plan  Current plan is for evaluation by transition of care as the patient is adamant that he cannot take care of himself at home, physical therapy has been consulted for their evaluation and transition of care for possible rehab.    Eber Hong, MD 01/07/23 [phone]

## 2023-01-08 LAB — BASIC METABOLIC PANEL
Anion gap: 10 (ref 5–15)
BUN: 21 mg/dL — ABNORMAL HIGH (ref 6–20)
CO2: 26 mmol/L (ref 22–32)
Calcium: 8.9 mg/dL (ref 8.9–10.3)
Chloride: 97 mmol/L — ABNORMAL LOW (ref 98–111)
Creatinine, Ser: 1.17 mg/dL (ref 0.61–1.24)
GFR, Estimated: >60 mL/min (ref >60)
Glucose, Bld: 110 mg/dL — ABNORMAL HIGH (ref 70–99)
Potassium: 3.4 mmol/L — ABNORMAL LOW (ref 3.5–5.1)
Sodium: 133 mmol/L — ABNORMAL LOW (ref 135–145)

## 2023-01-08 LAB — MAGNESIUM: Magnesium: 1.7 mg/dL (ref 1.7–2.4)

## 2023-01-08 NOTE — ED Notes (Signed)
 Pt sleeping and resting comfortably at this time.

## 2023-01-08 NOTE — Progress Notes (Signed)
Bed offers still pending. TOC to follow.  Valentina Shaggy.Zoriyah Scheidegger, MSW, LCSWA [facility]  Transitions of Care Clinical Social Worker I Direct Dial: [phone]  Fax: [phone] [email or web address]

## 2023-01-08 NOTE — ED Notes (Signed)
Pt provided clean towels and helped to restroom so he may shower and perform hygiene independently.

## 2023-01-08 NOTE — ED Notes (Signed)
Pt provided Hospital bed to assist with comfort for his back.

## 2023-01-08 NOTE — ED Provider Notes (Signed)
Emergency Medicine Observation Re-evaluation Note  John Cook is a 52 y.o. male, seen on rounds today.  Pt initially presented to the ED for complaints of Back Pain Currently, the patient is pending placement, he has worked with physical therapy but is needing assistance.  Physical Exam  BP (!) 146/81 (BP Location: Left Arm)   Pulse 85   Temp 98.9 F (37.2 C) (Oral)   Resp 20   Ht 1.651 m (5\' 5" )   Wt (!) 161 kg   SpO2 100%   BMI 59.08 kg/m  Physical Exam General: No distress, laying in bed Cardiac: No tachycardia Lungs: Clear lungs, no tachypnea Psych: Comfortable, no internal stimuli  ED Course / MDM  EKG:   I have reviewed the labs performed to date as well as medications administered while in observation.  Recent changes in the last 24 hours include no changes.  Plan  Current plan is for placement.    Eber Hong, MD 01/08/23 [phone]

## 2023-01-09 NOTE — ED Provider Notes (Signed)
Emergency Medicine Observation Re-evaluation Note  John Cook is a 52 y.o. male, seen on rounds today.  Pt initially presented to the ED for complaints of Back Pain Currently, the patient is calm and resting, awaiting placement.  Physical Exam  BP (!) 150/88 (BP Location: Left Arm)   Pulse 84   Temp 98.8 F (37.1 C) (Oral)   Resp 20   Ht 1.651 m (5\' 5" )   Wt (!) 161 kg   SpO2 100%   BMI 59.08 kg/m  Physical Exam General: No distress Cardiac: No tachycardia Lungs: No increased work of breathing Psych: Calm and answers questions appropriately  ED Course / MDM  EKG:   I have reviewed the labs performed to date as well as medications administered while in observation.  Recent changes in the last 24 hours include none.  Plan  Current plan is for awaiting placement by transition of care team.    Eber Hong, MD 01/09/23 [phone]

## 2023-01-09 NOTE — ED Notes (Signed)
Pt lying in bed stated he is in pain. This nurse verbalized that it isn't time for more pain medication. Pt verbalized understanding. Pt ambulated to restroom with minimal assistance.

## 2023-01-09 NOTE — ED Notes (Signed)
Patient has been declined from several facilities, awaiting to hear back from [facility]. TOC will continue to follow.

## 2023-01-10 NOTE — ED Notes (Signed)
 CSW spoke with patient and informed him that at this time no facility accepted him for placement, but Artavia with [facility] was able to assist with providing HHPT/OT. Patient stated that he understands but will have to call his brother who is in another state to see what he says because he is unsure about returning home. Patient states,  we can do it , but I don't know how it will go?   CSW did reach out to provider regarding orders. TOC will continue to follow.

## 2023-01-10 NOTE — ED Provider Notes (Signed)
 Emergency Medicine Observation Re-evaluation Note  John Cook is a 52 y.o. male, seen on rounds today.  Pt initially presented to the ED for complaints of Back Pain Currently, the patient is resting   Physical Exam  BP (!) 147/68   Pulse 83   Temp 98.1 F (36.7 C) (Oral)   Resp 20   Ht 5' 5 (1.651 m)   Wt (!) 161 kg   SpO2 100%   BMI 59.08 kg/m  Physical Exam General: NAD Cardiac: RR  Lungs: normal WOB  Psych: Calm   ED Course / MDM  EKG:   I have reviewed the labs performed to date as well as medications administered while in observation.  Recent changes in the last 24 hours include none.  Plan  Current plan is for placement vs HHPT/OT.     Neysa Caron PARAS, DO 01/10/23 1521

## 2023-01-10 NOTE — ED Notes (Deleted)
Verbal report given to accepting floor nurse Emmit Pomfret), patient in stable condition.

## 2023-01-11 MED ORDER — ONDANSETRON 8 MG PO TBDP
8.0000 mg | ORAL_TABLET | Freq: Once | ORAL | Status: AC
  Administered 2023-01-11: 8 mg via ORAL
  Filled 2023-01-11: qty 1

## 2023-01-11 NOTE — Discharge Instructions (Signed)
 You were seen in the emergency department for back pain You were evaluated by transition of care team We have arranged to set up home health services for you Please pick up the commode we have prescribed Return to the Emergency Department for severe pain, falls, or any other concerns Follow-up with your primary care doctor in 1 week for reevaluation

## 2023-01-11 NOTE — ED Notes (Signed)
 Pt lying in bed sleeping. No apparent distress noticed.

## 2023-01-11 NOTE — ED Notes (Signed)
 Pt awake lying in bed.

## 2023-01-11 NOTE — ED Provider Notes (Signed)
 Emergency Medicine Observation Re-evaluation Note  John Cook is a 53 y.o. male, seen on rounds today.  Pt initially presented to the ED for complaints of Back Pain Currently, the patient is resting comfortably in ED bed hallway 6.  Physical Exam  BP (!) 125/50   Pulse 68   Temp 98.4 F (36.9 C) (Oral)   Resp (!) 22   Ht 5' 5 (1.651 m)   Wt (!) 161 kg   SpO2 92%   BMI 59.08 kg/m  Physical Exam General: Awake. Alert. No acute distress Cardiac: Regular rate rhythm Lungs: Clear to auscultation bilaterally Psych: Calm and cooperative  ED Course / MDM  EKG:   I have reviewed the labs performed to date as well as medications administered while in observation.  Recent changes in the last 24 hours include home health assessment arrangements for PT OT.  Plan  Current plan is for discharge home with home health services in place.  Will write prescription for commode.  Stable for discharge.  Family member will come to the ED to pick him up    John Ozell LABOR, DO 01/11/23 1549

## 2023-01-11 NOTE — ED Notes (Signed)
 [facility] was able to accept ordered [facility] services and will call patient with start of care. TOC signing off.

## 2023-02-06 ENCOUNTER — Telehealth: Payer: Self-pay | Admitting: Orthopaedic Surgery

## 2023-02-06 DIAGNOSIS — M545 Low back pain, unspecified: Secondary | ICD-10-CM

## 2023-02-06 NOTE — Telephone Encounter (Signed)
Called patient and advised him  that we do not havea pain management here at the clinic but can be referred to one.  He said please do and asap I told him I would speak with Dr Hilda Lias on Wednesday and get back with him

## 2023-02-06 NOTE — Telephone Encounter (Signed)
Dr. Sanjuan Dame pt - pt lvm asking if we have a pain management program.  [phone]

## 2023-02-08 ENCOUNTER — Inpatient Hospital Stay (HOSPITAL_COMMUNITY)
Admit: 2023-02-08 | Discharge: 2023-02-24 | DRG: 496 | Disposition: A | Discharge status: Skilled Nursing Facility | Payer: Medicaid Other | Source: Other Acute Inpatient Hospital | Attending: Neurosurgery | Admitting: Neurosurgery

## 2023-02-08 ENCOUNTER — Encounter (HOSPITAL_COMMUNITY): Payer: Self-pay

## 2023-02-08 ENCOUNTER — Telehealth: Payer: Self-pay

## 2023-02-08 DIAGNOSIS — K219 Gastro-esophageal reflux disease without esophagitis: Secondary | ICD-10-CM | POA: Diagnosis present

## 2023-02-08 DIAGNOSIS — N179 Acute kidney failure, unspecified: Secondary | ICD-10-CM | POA: Diagnosis not present

## 2023-02-08 DIAGNOSIS — G4733 Obstructive sleep apnea (adult) (pediatric): Secondary | ICD-10-CM | POA: Diagnosis present

## 2023-02-08 DIAGNOSIS — Z791 Long term (current) use of non-steroidal anti-inflammatories (NSAID): Secondary | ICD-10-CM

## 2023-02-08 DIAGNOSIS — Z833 Family history of diabetes mellitus: Secondary | ICD-10-CM | POA: Diagnosis not present

## 2023-02-08 DIAGNOSIS — Y838 Other surgical procedures as the cause of abnormal reaction of the patient, or of later complication, without mention of misadventure at the time of the procedure: Secondary | ICD-10-CM | POA: Diagnosis present

## 2023-02-08 DIAGNOSIS — E669 Obesity, unspecified: Secondary | ICD-10-CM | POA: Diagnosis present

## 2023-02-08 DIAGNOSIS — Z888 Allergy status to other drugs, medicaments and biological substances status: Secondary | ICD-10-CM

## 2023-02-08 DIAGNOSIS — Z8249 Family history of ischemic heart disease and other diseases of the circulatory system: Secondary | ICD-10-CM | POA: Diagnosis not present

## 2023-02-08 DIAGNOSIS — Z79899 Other long term (current) drug therapy: Secondary | ICD-10-CM | POA: Diagnosis not present

## 2023-02-08 DIAGNOSIS — E782 Mixed hyperlipidemia: Secondary | ICD-10-CM | POA: Diagnosis present

## 2023-02-08 DIAGNOSIS — Z7984 Long term (current) use of oral hypoglycemic drugs: Secondary | ICD-10-CM

## 2023-02-08 DIAGNOSIS — R296 Repeated falls: Secondary | ICD-10-CM | POA: Diagnosis present

## 2023-02-08 DIAGNOSIS — Z825 Family history of asthma and other chronic lower respiratory diseases: Secondary | ICD-10-CM | POA: Diagnosis not present

## 2023-02-08 DIAGNOSIS — I10 Essential (primary) hypertension: Secondary | ICD-10-CM | POA: Diagnosis present

## 2023-02-08 DIAGNOSIS — M960 Pseudarthrosis after fusion or arthrodesis: Secondary | ICD-10-CM | POA: Diagnosis present

## 2023-02-08 DIAGNOSIS — E119 Type 2 diabetes mellitus without complications: Secondary | ICD-10-CM | POA: Diagnosis present

## 2023-02-08 DIAGNOSIS — T8484XA Pain due to internal orthopedic prosthetic devices, implants and grafts, initial encounter: Secondary | ICD-10-CM | POA: Diagnosis not present

## 2023-02-08 DIAGNOSIS — Z87891 Personal history of nicotine dependence: Secondary | ICD-10-CM

## 2023-02-08 DIAGNOSIS — T84226A Displacement of internal fixation device of vertebrae, initial encounter: Secondary | ICD-10-CM | POA: Diagnosis present

## 2023-02-08 DIAGNOSIS — Z6841 Body Mass Index (BMI) 40.0 and over, adult: Secondary | ICD-10-CM

## 2023-02-08 DIAGNOSIS — Z751 Person awaiting admission to adequate facility elsewhere: Secondary | ICD-10-CM

## 2023-02-08 DIAGNOSIS — J45909 Unspecified asthma, uncomplicated: Secondary | ICD-10-CM | POA: Diagnosis not present

## 2023-02-08 DIAGNOSIS — M4726 Other spondylosis with radiculopathy, lumbar region: Principal | ICD-10-CM | POA: Diagnosis present

## 2023-02-08 NOTE — Progress Notes (Signed)
Patient arrived to room 3W31 from [facility] ED.  Assessment complete, VS obtained, and Admission database began.

## 2023-02-08 NOTE — Addendum Note (Signed)
Addended by: Michaele Offer on: 02/08/2023 03:01 PM   Modules accepted: Orders

## 2023-02-08 NOTE — Telephone Encounter (Signed)
LVM letting patient know that he has been referred to a pain management and should be receiving a phone call from [facility]

## 2023-02-09 ENCOUNTER — Encounter (HOSPITAL_COMMUNITY): Payer: Self-pay | Admitting: Neurosurgery

## 2023-02-09 ENCOUNTER — Other Ambulatory Visit: Payer: Self-pay

## 2023-02-09 DIAGNOSIS — M4726 Other spondylosis with radiculopathy, lumbar region: Principal | ICD-10-CM | POA: Diagnosis present

## 2023-02-09 LAB — GLUCOSE, CAPILLARY
Glucose-Capillary: 105 mg/dL — ABNORMAL HIGH (ref 70–99)
Glucose-Capillary: 99 mg/dL (ref 70–99)
Glucose-Capillary: 101 mg/dL — ABNORMAL HIGH (ref 70–99)
Glucose-Capillary: 102 mg/dL — ABNORMAL HIGH (ref 70–99)
Glucose-Capillary: 97 mg/dL (ref 70–99)

## 2023-02-09 LAB — CBC WITH DIFFERENTIAL/PLATELET
Abs Immature Granulocytes: 0.07 10*3/uL (ref 0.00–0.07)
Basophils Absolute: 0 10*3/uL (ref 0.0–0.1)
Basophils Relative: 0 %
Eosinophils Absolute: 0.1 10*3/uL (ref 0.0–0.5)
Eosinophils Relative: 1 %
HCT: 38.5 % — ABNORMAL LOW (ref 39.0–52.0)
Hemoglobin: 12.8 g/dL — ABNORMAL LOW (ref 13.0–17.0)
Immature Granulocytes: 1 %
Lymphocytes Relative: 21 %
Lymphs Abs: 2.2 10*3/uL (ref 0.7–4.0)
MCH: 27.6 pg (ref 26.0–34.0)
MCHC: 33.2 g/dL (ref 30.0–36.0)
MCV: 83.2 fL (ref 80.0–100.0)
Monocytes Absolute: 1.1 10*3/uL — ABNORMAL HIGH (ref 0.1–1.0)
Monocytes Relative: 10 %
Neutro Abs: 7.1 10*3/uL (ref 1.7–7.7)
Neutrophils Relative %: 67 %
Platelets: 332 10*3/uL (ref 150–400)
RBC: 4.63 MIL/uL (ref 4.22–5.81)
RDW: 13.3 % (ref 11.5–15.5)
WBC: 10.7 10*3/uL — ABNORMAL HIGH (ref 4.0–10.5)
nRBC: 0 % (ref 0.0–0.2)

## 2023-02-09 LAB — COMPREHENSIVE METABOLIC PANEL
ALT: 17 U/L (ref 0–44)
AST: 24 U/L (ref 15–41)
Albumin: 3.2 g/dL — ABNORMAL LOW (ref 3.5–5.0)
Alkaline Phosphatase: 84 U/L (ref 38–126)
Anion gap: 14 (ref 5–15)
BUN: 30 mg/dL — ABNORMAL HIGH (ref 6–20)
CO2: 25 mmol/L (ref 22–32)
Calcium: 9.8 mg/dL (ref 8.9–10.3)
Chloride: 91 mmol/L — ABNORMAL LOW (ref 98–111)
Creatinine, Ser: 1.57 mg/dL — ABNORMAL HIGH (ref 0.61–1.24)
GFR, Estimated: 52 mL/min — ABNORMAL LOW (ref >60)
Glucose, Bld: 108 mg/dL — ABNORMAL HIGH (ref 70–99)
Potassium: 3.6 mmol/L (ref 3.5–5.1)
Sodium: 130 mmol/L — ABNORMAL LOW (ref 135–145)
Total Bilirubin: 0.6 mg/dL (ref 0.0–1.2)
Total Protein: 8.4 g/dL — ABNORMAL HIGH (ref 6.5–8.1)

## 2023-02-09 LAB — PROTIME-INR
INR: 1.2 (ref 0.8–1.2)
Prothrombin Time: 15.1 s (ref 11.4–15.2)

## 2023-02-09 LAB — HIV ANTIBODY (ROUTINE TESTING W REFLEX): HIV Screen 4th Generation wRfx: NONREACTIVE

## 2023-02-09 LAB — APTT: aPTT: 47 s — ABNORMAL HIGH (ref 24–36)

## 2023-02-09 MED ORDER — MAGNESIUM CITRATE PO SOLN: 1.0000 | Freq: Once | ORAL | Status: DC | PRN

## 2023-02-09 MED ORDER — HEPARIN SODIUM (PORCINE) 5000 UNIT/ML IJ SOLN
5000.0000 [IU] | Freq: Three times a day (TID) | INTRAMUSCULAR | Status: DC
  Administered 2023-02-09 – 2023-02-24 (×45): 5000 [IU] via SUBCUTANEOUS
  Filled 2023-02-09 (×46): qty 1

## 2023-02-09 MED ORDER — SENNOSIDES-DOCUSATE SODIUM 8.6-50 MG PO TABS
1.0000 | ORAL_TABLET | Freq: Every evening | ORAL | Status: DC | PRN
  Administered 2023-02-15 – 2023-02-22 (×2): 1 via ORAL
  Filled 2023-02-09 (×2): qty 1

## 2023-02-09 MED ORDER — METFORMIN HCL 500 MG PO TABS
500.0000 mg | ORAL_TABLET | Freq: Every day | ORAL | Status: DC
  Administered 2023-02-09 – 2023-02-24 (×16): 500 mg via ORAL
  Filled 2023-02-09 (×16): qty 1

## 2023-02-09 MED ORDER — DIAZEPAM 5 MG PO TABS
5.0000 mg | ORAL_TABLET | Freq: Four times a day (QID) | ORAL | Status: DC | PRN
  Administered 2023-02-09 – 2023-02-21 (×18): 5 mg via ORAL
  Filled 2023-02-09 (×20): qty 1

## 2023-02-09 MED ORDER — ALBUTEROL SULFATE (2.5 MG/3ML) 0.083% IN NEBU: 2.5000 mg | INHALATION_SOLUTION | RESPIRATORY_TRACT | Status: DC | PRN

## 2023-02-09 MED ORDER — INSULIN ASPART 100 UNIT/ML IJ SOLN
0.0000 [IU] | INTRAMUSCULAR | Status: DC
  Administered 2023-02-10: 3 [IU] via SUBCUTANEOUS

## 2023-02-09 MED ORDER — PANTOPRAZOLE SODIUM 40 MG PO TBEC
40.0000 mg | DELAYED_RELEASE_TABLET | Freq: Every day | ORAL | Status: DC
  Administered 2023-02-09 – 2023-02-24 (×16): 40 mg via ORAL
  Filled 2023-02-09 (×16): qty 1

## 2023-02-09 MED ORDER — ACETAMINOPHEN 650 MG RE SUPP: 650.0000 mg | Freq: Four times a day (QID) | RECTAL | Status: DC | PRN

## 2023-02-09 MED ORDER — ONDANSETRON HCL 4 MG/2ML IJ SOLN
4.0000 mg | Freq: Four times a day (QID) | INTRAMUSCULAR | Status: DC | PRN
  Administered 2023-02-09: 4 mg via INTRAVENOUS
  Filled 2023-02-09: qty 2

## 2023-02-09 MED ORDER — OXYBUTYNIN CHLORIDE 5 MG PO TABS
5.0000 mg | ORAL_TABLET | Freq: Every day | ORAL | Status: DC
  Administered 2023-02-09 – 2023-02-24 (×16): 5 mg via ORAL
  Filled 2023-02-09 (×16): qty 1

## 2023-02-09 MED ORDER — OXYCODONE HCL 5 MG PO TABS
5.0000 mg | ORAL_TABLET | ORAL | Status: DC | PRN
Start: 2023-02-09 — End: 2023-02-24
  Administered 2023-02-09 – 2023-02-11 (×12): 10 mg via ORAL
  Administered 2023-02-12: 5 mg via ORAL
  Administered 2023-02-12 – 2023-02-14 (×9): 10 mg via ORAL
  Administered 2023-02-14: 5 mg via ORAL
  Administered 2023-02-14 – 2023-02-15 (×5): 10 mg via ORAL
  Administered 2023-02-15: 5 mg via ORAL
  Administered 2023-02-16: 10 mg via ORAL
  Administered 2023-02-16: 5 mg via ORAL
  Administered 2023-02-16 – 2023-02-18 (×7): 10 mg via ORAL
  Administered 2023-02-18: 5 mg via ORAL
  Administered 2023-02-18 – 2023-02-19 (×2): 10 mg via ORAL
  Administered 2023-02-19: 5 mg via ORAL
  Administered 2023-02-19 – 2023-02-21 (×6): 10 mg via ORAL
  Administered 2023-02-21 (×2): 5 mg via ORAL
  Administered 2023-02-22 (×4): 10 mg via ORAL
  Administered 2023-02-23 (×3): 5 mg via ORAL
  Administered 2023-02-23 – 2023-02-24 (×3): 10 mg via ORAL
  Filled 2023-02-09 (×10): qty 2
  Filled 2023-02-09 (×2): qty 1
  Filled 2023-02-09 (×5): qty 2
  Filled 2023-02-09 (×2): qty 1
  Filled 2023-02-09 (×13): qty 2
  Filled 2023-02-09: qty 1
  Filled 2023-02-09 (×13): qty 2
  Filled 2023-02-09: qty 1
  Filled 2023-02-09: qty 2
  Filled 2023-02-09: qty 1
  Filled 2023-02-09 (×7): qty 2
  Filled 2023-02-09: qty 1
  Filled 2023-02-09 (×4): qty 2

## 2023-02-09 MED ORDER — ATENOLOL 25 MG PO TABS
50.0000 mg | ORAL_TABLET | Freq: Every day | ORAL | Status: DC
  Administered 2023-02-09 – 2023-02-20 (×8): 50 mg via ORAL
  Filled 2023-02-09 (×17): qty 2

## 2023-02-09 MED ORDER — ACETAMINOPHEN 325 MG PO TABS
650.0000 mg | ORAL_TABLET | Freq: Four times a day (QID) | ORAL | Status: DC | PRN
  Administered 2023-02-24: 650 mg via ORAL
  Filled 2023-02-09: qty 2

## 2023-02-09 MED ORDER — DULOXETINE HCL 30 MG PO CPEP
30.0000 mg | ORAL_CAPSULE | Freq: Two times a day (BID) | ORAL | Status: DC
  Administered 2023-02-09 – 2023-02-24 (×31): 30 mg via ORAL
  Filled 2023-02-09 (×31): qty 1

## 2023-02-09 MED ORDER — MORPHINE SULFATE (PF) 2 MG/ML IV SOLN
2.0000 mg | INTRAVENOUS | Status: DC | PRN
Start: 2023-02-09 — End: 2023-02-24
  Administered 2023-02-09 – 2023-02-13 (×2): 4 mg via INTRAVENOUS
  Filled 2023-02-09 (×2): qty 2

## 2023-02-09 MED ORDER — POTASSIUM CHLORIDE IN NACL 20-0.9 MEQ/L-% IV SOLN
INTRAVENOUS | Status: AC
  Filled 2023-02-09: qty 1000

## 2023-02-09 MED ORDER — ONDANSETRON HCL 4 MG PO TABS
4.0000 mg | ORAL_TABLET | Freq: Four times a day (QID) | ORAL | Status: DC | PRN
  Administered 2023-02-22: 4 mg via ORAL
  Filled 2023-02-09: qty 1

## 2023-02-09 MED ORDER — FENOFIBRATE 160 MG PO TABS
160.0000 mg | ORAL_TABLET | Freq: Every day | ORAL | Status: DC
  Administered 2023-02-09 – 2023-02-24 (×16): 160 mg via ORAL
  Filled 2023-02-09 (×16): qty 1

## 2023-02-09 MED ORDER — ZOLPIDEM TARTRATE 5 MG PO TABS
5.0000 mg | ORAL_TABLET | Freq: Every evening | ORAL | Status: DC | PRN
Start: 2023-02-09 — End: 2023-02-24
  Administered 2023-02-12 – 2023-02-17 (×3): 5 mg via ORAL
  Filled 2023-02-09 (×3): qty 1

## 2023-02-09 MED ORDER — GABAPENTIN 300 MG PO CAPS
600.0000 mg | ORAL_CAPSULE | Freq: Two times a day (BID) | ORAL | Status: DC
  Administered 2023-02-09 – 2023-02-24 (×32): 600 mg via ORAL
  Filled 2023-02-09 (×32): qty 2

## 2023-02-09 MED ORDER — TRIAMTERENE-HCTZ 37.5-25 MG PO TABS
1.0000 | ORAL_TABLET | Freq: Every day | ORAL | Status: DC
  Administered 2023-02-09 – 2023-02-22 (×12): 1 via ORAL
  Filled 2023-02-09 (×16): qty 1

## 2023-02-09 MED ORDER — ROSUVASTATIN CALCIUM 20 MG PO TABS
20.0000 mg | ORAL_TABLET | Freq: Every day | ORAL | Status: DC
  Administered 2023-02-09 – 2023-02-23 (×15): 20 mg via ORAL
  Filled 2023-02-09 (×15): qty 1

## 2023-02-09 MED ORDER — SENNA 8.6 MG PO TABS
1.0000 | ORAL_TABLET | Freq: Two times a day (BID) | ORAL | Status: DC
  Administered 2023-02-09 – 2023-02-24 (×23): 8.6 mg via ORAL
  Filled 2023-02-09 (×29): qty 1

## 2023-02-09 MED ORDER — BISACODYL 5 MG PO TBEC
5.0000 mg | DELAYED_RELEASE_TABLET | Freq: Every day | ORAL | Status: DC | PRN
Start: 2023-02-09 — End: 2023-02-24
  Administered 2023-02-16: 5 mg via ORAL
  Filled 2023-02-09 (×2): qty 1

## 2023-02-09 NOTE — Progress Notes (Signed)
  NEUROSURGERY PROGRESS NOTE   No issues overnight. Pt transferred from [facility] c/o worsening back pain with movement over last few weeks. He reports having suffered multiple falls. Pain is primarily in the low back does not report significant radiation of pain into the LE.   EXAM:  BP (!) 121/56 (BP Location: Left Arm)   Pulse 72   Temp 99.2 F (37.3 C) (Oral)   Resp 18   Ht 5\' 5"  (1.651 m)   Wt (!) 158 kg   SpO2 94%   BMI 57.96 kg/m   Awake, alert, oriented  Speech fluent, appropriate  CN grossly intact  5/5 BUE/BLE  Wound c/d/i  IMAGING: CT L-spine reviewed and does demonstrate perhaps mild cage subsidence. There is fracture through the lateral aspect of the left L4 pedicle/TP with significant halo around this screw. There is also more subtle halo around the right L4 screw as well. L5 screws appear to be well positioned.  IMPRESSION:  53 y.o. male with worsening mechanical back pain 47month after L4-5 PLIF with imaging revealing some cage subsidence and lossening of bilateral L4 screws. While an attempt at conservative treatment is an option, this will likely take at least a month or two. As patient is unable to stand or care for himself due to pain, we can rather replace the loose screws in an attempt to provide the fastest recovery.  PLAN: - Will plan on revision of hardware, likely replacement of L4 screws, possible extension to L3 on Monday morning - Cont LSO brace when up - Cont gabapentin and PRN valium/oxycodone - NPO p MN Sunday 2/2   Lisbeth Renshaw, MD [facility]

## 2023-02-09 NOTE — H&P (Addendum)
John Cook is an 53 y.o. male.   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 Has had repeated trips to the ED at [facility] and [facility] due to pain. CT performed at [facility] showed cage migration in the disc space since his procedure. Admitted after transfer from [facility].  No bowel and or bladder dysfunction. He is ambulating, and has been wearing his brace. No evidence of infection. He did fall post op on multiple occasions.  Past Medical History:  Diagnosis Date   Arthritis    Asthma    Diabetes mellitus without complication (HCC)    GERD (gastroesophageal reflux disease)    History of hiatal hernia    Hyperlipidemia    Hypertension    Mixed hyperlipidemia    Neuropathy    Prediabetes    Sleep apnea     Past Surgical History:  Procedure Laterality Date   CATARACT EXTRACTION Bilateral    with lens implant   INCISION AND DRAINAGE ABSCESS Left 09/24/2015   Procedure: INCISION AND DRAINAGE OF LEFT NECK ABSCESS;  Surgeon: Chevis Pretty III, MD;  Location: [facility] OR;  Service: General;  Laterality: Left;   LEFT HEART CATH AND CORONARY ANGIOGRAPHY N/A 10/16/2017   Procedure: LEFT HEART CATH AND CORONARY ANGIOGRAPHY;  Surgeon: Yvonne Kendall, MD;  Location: [facility] INVASIVE CV LAB;  Service: Cardiovascular;  Laterality: N/A;    Family History  Problem Relation Age of Onset   Hypertension Mother    COPD Mother    Heart disease Mother    Diabetes Mother    Heart attack Father    Social History:  reports that he quit smoking about 27 years ago. His smoking use included cigarettes. He has never used smokeless tobacco. He reports that he does not currently use alcohol. He reports that he does not use drugs.  Allergies:  Allergies  Allergen Reactions   Lisinopril Cough    Medications Prior to Admission  Medication Sig Dispense Refill   acetaminophen (TYLENOL) 500 MG  tablet Take 500 mg by mouth every 6 (six) hours as needed for mild pain or moderate pain.     atenolol (TENORMIN) 50 MG tablet Take 50 mg by mouth daily.     celecoxib (CELEBREX) 200 MG capsule Take 200 mg by mouth 2 (two) times daily.     DULoxetine (CYMBALTA) 30 MG capsule Take 30 mg by mouth 2 (two) times daily.     fenofibrate (TRICOR) 145 MG tablet Take 145 mg by mouth daily.     gabapentin (NEURONTIN) 300 MG capsule Take 600 mg by mouth 2 (two) times daily.     metFORMIN (GLUCOPHAGE) 500 MG tablet Take 500 mg by mouth daily.     methocarbamol (ROBAXIN) 500 MG tablet Take 1 tablet (500 mg total) by mouth every 6 (six) hours as needed for muscle spasms. 60 tablet 0   methylPREDNISolone (MEDROL DOSEPAK) 4 MG TBPK tablet Please take the Dosepak as described on package instructions. (Patient taking differently: Take 4-24 mg by mouth See admin instructions. Please take the Dosepak as described on package instructions (6,5,4,3,2,1) Starting on 01/05/2023) 21 each 0   omeprazole (PRILOSEC) 20 MG capsule Take 20 mg by mouth in the morning and at bedtime.     oxybutynin (DITROPAN) 5 MG tablet Take 5 mg by mouth daily.     oxyCODONE-acetaminophen (PERCOCET) 10-325 MG tablet Take 1 tablet by mouth  every 6 (six) hours as needed for pain. 20 tablet 0   PROVENTIL HFA 108 (90 Base) MCG/ACT inhaler Inhale 2 puffs into the lungs every 4 (four) hours as needed for shortness of breath or wheezing.  0   rosuvastatin (CRESTOR) 20 MG tablet Take 20 mg by mouth at bedtime.     triamterene-hydrochlorothiazide (MAXZIDE-25) 37.5-25 MG tablet Take 1 tablet by mouth daily.      No results found for this or any previous visit (from the past 48 hours). No results found.  Review of Systems  Constitutional:  Positive for activity change.  HENT: Negative.    Eyes: Negative.   Respiratory: Negative.    Cardiovascular: Negative.   Gastrointestinal: Negative.   Endocrine: Negative.   Genitourinary: Negative.    Musculoskeletal:  Positive for arthralgias and back pain.  Allergic/Immunologic: Negative.   Neurological:  Positive for weakness.  Hematological: Negative.   Psychiatric/Behavioral: Negative.      Blood pressure (!) 143/79, pulse 75, temperature 99.8 F (37.7 C), temperature source Oral, resp. rate 19, SpO2 100%. Physical Exam Constitutional:      General: He is in acute distress.     Appearance: He is obese.  HENT:     Head: Normocephalic and atraumatic.     Right Ear: External ear normal.     Left Ear: External ear normal.     Nose: Nose normal.     Mouth/Throat:     Mouth: Mucous membranes are moist.     Pharynx: Oropharynx is clear.  Eyes:     Extraocular Movements: Extraocular movements intact.     Conjunctiva/sclera: Conjunctivae normal.     Pupils: Pupils are equal, round, and reactive to light.  Cardiovascular:     Rate and Rhythm: Normal rate and regular rhythm.  Pulmonary:     Effort: Pulmonary effort is normal.  Abdominal:     Palpations: Abdomen is soft.  Musculoskeletal:     Cervical back: Normal range of motion and neck supple.  Skin:    General: Skin is warm and dry.     Findings: Bruising present.     Comments: Wound is clean and dry, no erythema  Neurological:     General: No focal deficit present.     Mental Status: He is oriented to person, place, and time.     Cranial Nerves: No cranial nerve deficit.     Motor: Weakness present.     Coordination: Coordination normal.     Gait: Gait abnormal.  Psychiatric:        Mood and Affect: Mood normal.        Behavior: Behavior normal.        Thought Content: Thought content normal.        Judgment: Judgment normal.      Assessment/Plan [name] is a 53 y.o. male With continued and by his description severe back pain since his operation. CT done at [facility] shows the interbody cages have migrated within the disc space. The pedicle screws appear to be unchanged from the intra-op films.  Transferred for evaluation and pain control.  Weakness in lower extremities due to pain limitations. Will keep npo at this time.   Coletta Memos, MD 02/09/2023, 1:25 AM

## 2023-02-10 LAB — GLUCOSE, CAPILLARY
Glucose-Capillary: 100 mg/dL — ABNORMAL HIGH (ref 70–99)
Glucose-Capillary: 101 mg/dL — ABNORMAL HIGH (ref 70–99)
Glucose-Capillary: 105 mg/dL — ABNORMAL HIGH (ref 70–99)
Glucose-Capillary: 106 mg/dL — ABNORMAL HIGH (ref 70–99)
Glucose-Capillary: 107 mg/dL — ABNORMAL HIGH (ref 70–99)
Glucose-Capillary: 127 mg/dL — ABNORMAL HIGH (ref 70–99)
Glucose-Capillary: 89 mg/dL (ref 70–99)

## 2023-02-10 NOTE — Plan of Care (Signed)

## 2023-02-10 NOTE — Plan of Care (Signed)
  Problem: Health Behavior/Discharge Planning: Goal: Ability to manage health-related needs will improve Outcome: Progressing   Problem: Clinical Measurements: Goal: Ability to maintain clinical measurements within normal limits will improve Outcome: Progressing   Problem: Coping: Goal: Level of anxiety will decrease Outcome: Progressing   

## 2023-02-10 NOTE — TOC Initial Note (Addendum)
Transition of Care [facility]) - Initial/Assessment Note    Patient Details  Name: John Cook MRN: [id number] Date of Birth: 1970/04/01  [facility]) CM/SW Contact:    Kermit Balo, RN Phone Number: 02/10/2023, 11:39 AM  Clinical Narrative:                  Pt is from home alone. He states he has a friend that will pick up groceries and provide some transportation. Pt was driving prior to his back issues but states his car is also not working. CM encouraged him to use his medicaid transportation. He states he doesn't like having to wait on this transportation. Due to pts age this is his best resource and CM has updated him on this,  until his back is better and he gets his car repaired.  Pt states he is not compliant with medications when not feeling well. CM has encouraged him to take his medications even when back hurts or under the weather.  Pt states he has been living off his mothers life insurance pay out and is waiting on disability. He has been denied twice. He is worried he will run out of money. He states he does use the local food banks weekly. Pt is interested in having information placed in [employer] to see if he can get further assistance after d/c.  CM will also send information to financial counseling to see if they can assist with disability.  Pt to have surgery on back again.  TOC following for d/c needs post surgery. Pt will need therapy evals post surgery also.   SDOH Interventions Today    Flowsheet Row Most Recent Value  SDOH Interventions   Food Insecurity Interventions Inpatient TOC, [employer]ARE[employer] Referral  Transportation Interventions Inpatient TOC        Expected Discharge Plan:  (TBD) Barriers to Discharge: Continued Medical Work up   Patient Goals and CMS Choice            Expected Discharge Plan and Services   Discharge Planning Services: CM Consult   Living arrangements for the past 2 months: Mobile Home                                       Prior Living Arrangements/Services Living arrangements for the past 2 months: Mobile Home Lives with:: Self Patient language and need for interpreter reviewed:: Yes Do you feel safe going back to the place where you live?: Yes        Care giver support system in place?: No (comment)   Criminal Activity/Legal Involvement Pertinent to Current Situation/Hospitalization: No - Comment as needed  Activities of Daily Living   ADL Screening (condition at time of admission) Independently performs ADLs?: No Does the patient have a NEW difficulty with bathing/dressing/toileting/self-feeding that is expected to last >3 days?: Yes (Initiates electronic notice to provider for possible OT consult) Does the patient have a NEW difficulty with getting in/out of bed, walking, or climbing stairs that is expected to last >3 days?: Yes (Initiates electronic notice to provider for possible PT consult) Does the patient have a NEW difficulty with communication that is expected to last >3 days?: No Is the patient deaf or have difficulty hearing?: No Does the patient have difficulty seeing, even when wearing glasses/contacts?: No Does the patient have difficulty concentrating, remembering, or making decisions?: No  Permission Sought/Granted  Emotional Assessment Appearance:: Appears stated age Attitude/Demeanor/Rapport: Engaged Affect (typically observed): Accepting Orientation: : Oriented to Self, Oriented to Place, Oriented to  Time, Oriented to Situation   Psych Involvement: No (comment)  Admission diagnosis:  Previous back surgery [Z98.890] Other spondylosis with radiculopathy, 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 [place] [address] Phone: [phone] Fax: [phone]  [facility] [address], [place] - [place] [address] [place] [address] Phone: [phone] Fax: [phone]  [facility] [address] [place] [address] Phone: [phone] Fax: [phone]     Social Drivers of Health (SDOH) Social History: SDOH Screenings   Food Insecurity: Food Insecurity Present (02/09/2023)  Housing: High Risk (02/09/2023)  Transportation Needs: Unmet Transportation Needs (02/09/2023)  Utilities: Not At Risk (02/09/2023)  Tobacco Use: Medium Risk (02/09/2023)   SDOH Interventions: Food Insecurity Interventions: Inpatient TOC, [id number] Referral Transportation Interventions: Inpatient TOC   Readmission Risk Interventions     No data to display

## 2023-02-10 NOTE — Progress Notes (Signed)
  NEUROSURGERY PROGRESS NOTE   No issues overnight. unchanged mechanical back pain  EXAM:  BP (!) 105/53 (BP Location: Left Arm)   Pulse 85   Temp 98.1 F (36.7 C) (Oral)   Resp 20   Ht 5\' 5"  (1.651 m)   Wt (!) 158 kg   SpO2 94%   BMI 57.96 kg/m   Awake, alert, oriented  Speech fluent, appropriate  CN grossly intact  5/5 BUE/BLE   IMPRESSION:  53 y.o. male readmitted with back pain related to cage migration and L4 screw loosening  PLAN: - cont supportive care, plan on revision of hardware on Monday am   Lisbeth Renshaw, MD [facility]

## 2023-02-10 NOTE — Evaluation (Signed)
Physical Therapy Evaluation Patient Details Name: John Cook MRN: [id number] DOB: [birth year] Today's Date: 02/10/2023  History of Present Illness  53 yo male with worsening back pain with movement over the past few weeks. Transferred from [facility] after CT shows cage migration in the disc space since his L4-L5 PLIF 12/29/22. Pt reports multiple falls post op, but has still been ambulating while using his brace.  Plan for replacement of L4 screws Monday morning. PMH Lumbar fusion with instrumentation for listhesis and a synovial cyst at L4/5 on 12/29/23.includes: asthma, DM, GERD, HLD, HTN  Clinical Impression  Pt had increased difficulty with mobilization and self care post L4-L5 PLIF surgery, including fall in which L4 screws loosened increasing his pain and decreasing his mobility. Pt continues to be severely limited in mobility and self care by back pain which some times radiates down his legs. Pt is currently min A for bed mobility and contact guard for transfers and limited ambulation which increases his pain. Patient will benefit from continued inpatient follow up therapy, <3 hours/day to regain his independence with mobility and self care. Pt scheduled for surgery to replace his back hardware on 2/3. PT will follow back afterwards for Re-Evaluation of strength and function.        If plan is discharge home, recommend the following: A little help with walking and/or transfers;Two people to help with bathing/dressing/bathroom;Assistance with cooking/housework;Direct supervision/assist for medications management;Direct supervision/assist for financial management;Assist for transportation;Help with stairs or ramp for entrance   Can travel by private vehicle    No    Equipment Recommendations None recommended by PT     Functional Status Assessment Patient has had a recent decline in their functional status and demonstrates the ability to make significant improvements in function in a  reasonable and predictable amount of time.     Precautions / Restrictions Precautions Precautions: Back;Fall Precaution Comments: verbally reviewed Restrictions Weight Bearing Restrictions Per Provider Order: No      Mobility  Bed Mobility Overal bed mobility: Needs Assistance Bed Mobility: Supine to Sit, Sit to Supine     Supine to sit: Min assist Sit to supine: Contact guard assist   General bed mobility comments: minimal increased time, pulling up on OT but able to lay down without assist    Transfers Overall transfer level: Needs assistance Equipment used: Rolling walker (2 wheels) Transfers: Sit to/from Stand Sit to Stand: Contact guard assist           General transfer comment: cues for RW management    Ambulation/Gait Ambulation/Gait assistance: Contact guard assist Gait Distance (Feet): 12 Feet Assistive device: Rolling walker (2 wheels) Gait Pattern/deviations: Step-through pattern, Trunk flexed, Antalgic Gait velocity: slowed Gait velocity interpretation: <1.8 ft/sec, indicate of risk for recurrent falls   General Gait Details: obviously in increased pain but powers through to walk to door and back with RW, contact guard for safety     Balance Overall balance assessment: Needs assistance Sitting-balance support: Feet supported, No upper extremity supported Sitting balance-Leahy Scale: Good     Standing balance support: Bilateral upper extremity supported, During functional activity, Reliant on assistive device for balance Standing balance-Leahy Scale: Poor                               Pertinent Vitals/Pain Pain Assessment Pain Assessment: Faces Faces Pain Scale: Hurts whole lot Pain Location: back Pain Descriptors / Indicators: Discomfort, Guarding, Grimacing Pain Intervention(s):  Limited activity within patient's tolerance, Monitored during session, Repositioned    Home Living Family/patient expects to be discharged to::  Private residence Living Arrangements: Alone Available Help at Discharge: Other (Comment) (relies on neighbors) Type of Home: Mobile home Home Access: Ramped entrance;Other (comment)       Home Layout: One level Home Equipment: BSC/3in1      Prior Function Prior Level of Function : Independent/Modified Independent;Driving;History of Falls (last six months)             Mobility Comments: two falls in last two months ADLs Comments: prior to previous back surgery was independent and driving, now is requiring help from niegbors and is barely leaving the house     Extremity/Trunk Assessment   Upper Extremity Assessment Upper Extremity Assessment: Defer to OT evaluation    Lower Extremity Assessment Lower Extremity Assessment: RLE deficits/detail;LLE deficits/detail RLE Deficits / Details: AROM/PROM limited by pain RLE: Unable to fully assess due to pain RLE Sensation: WNL LLE Deficits / Details: AROM/PROM limited by pain LLE: Unable to fully assess due to pain LLE Sensation: WNL    Cervical / Trunk Assessment Cervical / Trunk Assessment: Back Surgery  Communication   Communication Communication: No apparent difficulties Cueing Techniques: Verbal cues  Cognition Arousal: Alert Behavior During Therapy: WFL for tasks assessed/performed Overall Cognitive Status: Within Functional Limits for tasks assessed                                          General Comments General comments (skin integrity, edema, etc.): VSS on RA, pt requiring assist to place back brace behind him to don        Assessment/Plan    PT Assessment Patient needs continued PT services  PT Problem List Decreased strength;Decreased range of motion;Decreased activity tolerance;Decreased balance;Decreased mobility;Decreased coordination;Pain;Obesity       PT Treatment Interventions DME instruction;Gait training;Functional mobility training;Therapeutic activities;Therapeutic  exercise;Balance training;Neuromuscular re-education;Cognitive remediation;Patient/family education    PT Goals (Current goals can be found in the Care Plan section)  Acute Rehab PT Goals PT Goal Formulation: With patient Time For Goal Achievement: 02/24/23 Potential to Achieve Goals: Good    Frequency Min 1X/week        AM-PAC PT "6 Clicks" Mobility  Outcome Measure Help needed turning from your back to your side while in a flat bed without using bedrails?: A Little Help needed moving from lying on your back to sitting on the side of a flat bed without using bedrails?: A Little Help needed moving to and from a bed to a chair (including a wheelchair)?: A Little Help needed standing up from a chair using your arms (e.g., wheelchair or bedside chair)?: A Little Help needed to walk in hospital room?: A Little Help needed climbing 3-5 steps with a railing? : Total 6 Click Score: 16    End of Session Equipment Utilized During Treatment: Back brace Activity Tolerance: Patient limited by pain Patient left: in bed;with call bell/phone within reach;with bed alarm set Nurse Communication: Mobility status;Patient requests pain meds PT Visit Diagnosis: Other abnormalities of gait and mobility (R26.89);Pain Pain - part of body:  (back and LE)    Time: 9604-5409 PT Time Calculation (min) (ACUTE ONLY): 19 min   Charges:   PT Evaluation $PT Eval Moderate Complexity: 1 Mod   PT General Charges $$ ACUTE PT VISIT: 1 Visit  Alexzander Dolinger B. Beverely Risen PT, DPT Acute Rehabilitation Services Please use secure chat or  Call Office [phone]   [facility] 02/10/2023, 2:57 PM

## 2023-02-10 NOTE — Evaluation (Signed)
Occupational Therapy Evaluation Patient Details Name: John Cook MRN: [id number] DOB: 05-16-1970 Today's Date: 02/10/2023   History of Present Illness 53 yo male with worsening back pain with movement over the past few weeks. Transferred from [facility] after CT shows cage migration in the disc space since his L4-L5 PLIF 12/29/22. Pt reports multiple falls post op, but has still been ambulating while using his brace.  Plan for replacement of L4 screws Monday morning. PMH Lumbar fusion with instrumentation for listhesis and a synovial cyst at L4/5 on 12/29/23.includes: asthma, DM, GERD, HLD, HTN   Clinical Impression     Patient presenting with significant back pain, and one fall since returning home after surgery in December of '24. Patient is at contact gaurd for bed mobility, ambulation, and ADL management at this time. Anticipating surgery on 2/3 for revision of surgery. OT will complete re-evaluation to assess for discharge.    If plan is discharge home, recommend the following: A little help with walking and/or transfers;A little help with bathing/dressing/bathroom;Assistance with cooking/housework;Assist for transportation    Functional Status Assessment  Patient has had a recent decline in their functional status and demonstrates the ability to make significant improvements in function in a reasonable and predictable amount of time.  Equipment Recommendations  None recommended by OT    Recommendations for Other Services       Precautions / Restrictions Precautions Precaution Comments: verbally reviewed Restrictions Weight Bearing Restrictions Per Provider Order: No      Mobility Bed Mobility Overal bed mobility: Needs Assistance Bed Mobility: Supine to Sit, Sit to Supine     Supine to sit: Min assist Sit to supine: Contact guard assist   General bed mobility comments: minimal increased time, pulling up on OT but able to lay down without assist     Transfers Overall transfer level: Needs assistance Equipment used: Rolling walker (2 wheels) Transfers: Sit to/from Stand Sit to Stand: Contact guard assist           General transfer comment: cues for RW management      Balance Overall balance assessment: Needs assistance Sitting-balance support: Feet supported, No upper extremity supported Sitting balance-Leahy Scale: Good     Standing balance support: Bilateral upper extremity supported, During functional activity, Reliant on assistive device for balance Standing balance-Leahy Scale: Poor                             ADL either performed or assessed with clinical judgement   ADL Overall ADL's : Needs assistance/impaired Eating/Feeding: Set up;Sitting   Grooming: Set up;Sitting   Upper Body Bathing: Set up;Sitting   Lower Body Bathing: Minimal assistance;Sit to/from stand;Sitting/lateral leans   Upper Body Dressing : Set up;Sitting   Lower Body Dressing: Minimal assistance;Sitting/lateral leans;Sit to/from stand Lower Body Dressing Details (indicate cue type and reason): unable to complete figure 4, usually props his legs up due to body habitus Toilet Transfer: Contact guard assist;Ambulation;Regular Toilet   Toileting- Clothing Manipulation and Hygiene: Contact guard assist;Sitting/lateral lean;Sit to/from stand       Functional mobility during ADLs: Contact guard assist;Cueing for sequencing;Cueing for safety General ADL Comments: Patient presenting with significant back pain, and one fall since returning home after surgery in December of '24. Patient is at contact gaurd for bed mobility, ambulation, and ADL management at this time. Anticipating surgery on 2/3 for revision of surgery. OT will complete re-evaluation to assess for discharge.  Vision Baseline Vision/History: 1 Wears glasses Ability to See in Adequate Light: 0 Adequate Patient Visual Report: No change from baseline Vision  Assessment?: Wears glasses for driving     Perception Perception: Not tested       Praxis Praxis: Not tested       Pertinent Vitals/Pain Pain Assessment Pain Assessment: Faces Faces Pain Scale: Hurts whole lot Pain Location: back Pain Descriptors / Indicators: Discomfort, Guarding, Grimacing Pain Intervention(s): Limited activity within patient's tolerance, Monitored during session, Premedicated before session, Patient requesting pain meds-RN notified     Extremity/Trunk Assessment Upper Extremity Assessment Upper Extremity Assessment: Overall WFL for tasks assessed;Right hand dominant   Lower Extremity Assessment Lower Extremity Assessment: Defer to PT evaluation       Communication Communication Communication: No apparent difficulties Cueing Techniques: Verbal cues   Cognition Arousal: Alert Behavior During Therapy: WFL for tasks assessed/performed Overall Cognitive Status: Within Functional Limits for tasks assessed                                       General Comments       Exercises     Shoulder Instructions      Home Living Family/patient expects to be discharged to:: Private residence Living Arrangements: Alone Available Help at Discharge: Other (Comment) (relies on neighbors) Type of Home: Mobile home Home Access: Ramped entrance;Other (comment)     Home Layout: One level     Bathroom Shower/Tub: Chief Strategy Officer: Standard     Home Equipment: BSC/3in1          Prior Functioning/Environment Prior Level of Function : Independent/Modified Independent;Driving;History of Falls (last six months)             Mobility Comments: two falls in last two months ADLs Comments: prior to previous back surgery was independent and driving, now is requiring help from niegbors and is barely leaving the house        OT Problem List: Decreased range of motion;Decreased activity tolerance;Impaired balance (sitting  and/or standing);Decreased coordination;Decreased knowledge of precautions;Pain      OT Treatment/Interventions: Self-care/ADL training;Therapeutic exercise;Energy conservation;DME and/or AE instruction;Manual therapy;Therapeutic activities;Patient/family education;Balance training    OT Goals(Current goals can be found in the care plan section) Acute Rehab OT Goals Patient Stated Goal: to get this sorted OT Goal Formulation: With patient Time For Goal Achievement: 02/24/23 Potential to Achieve Goals: Good ADL Goals Pt Will Perform Lower Body Bathing: with supervision;sitting/lateral leans;sit to/from stand Pt Will Perform Lower Body Dressing: with supervision;sit to/from stand;sitting/lateral leans Pt Will Transfer to Toilet: with supervision;ambulating;regular height toilet Pt Will Perform Toileting - Clothing Manipulation and hygiene: with supervision;sitting/lateral leans;sit to/from stand Additional ADL Goal #1: Patient will be able to complete functional task in standing for 3-5 minutes prior to needing seated rest break in order to improve overall activity tolerance.  OT Frequency: Min 1X/week    Co-evaluation              AM-PAC OT "6 Clicks" Daily Activity     Outcome Measure Help from another person eating meals?: A Little Help from another person taking care of personal grooming?: A Little Help from another person toileting, which includes using toliet, bedpan, or urinal?: A Little Help from another person bathing (including washing, rinsing, drying)?: A Little Help from another person to put on and taking off regular upper body clothing?: A Little Help  from another person to put on and taking off regular lower body clothing?: A Little 6 Click Score: 18   End of Session Equipment Utilized During Treatment: Rolling walker (2 wheels) Nurse Communication: Mobility status;Patient requests pain meds  Activity Tolerance: Patient tolerated treatment well Patient left: in  bed;with call bell/phone within reach;with bed alarm set  OT Visit Diagnosis: Unsteadiness on feet (R26.81);Other abnormalities of gait and mobility (R26.89);History of falling (Z91.81);Pain                Time: 1610-9604 OT Time Calculation (min): 19 min Charges:  OT General Charges $OT Visit: 1 Visit OT Evaluation $OT Eval Moderate Complexity: 1 Mod  Pollyann Glen E. Libi Corso, OTR/L Acute Rehabilitation Services [phone]   Cherlyn Cushing 02/10/2023, 2:41 PM

## 2023-02-11 LAB — GLUCOSE, CAPILLARY
Glucose-Capillary: 100 mg/dL — ABNORMAL HIGH (ref 70–99)
Glucose-Capillary: 102 mg/dL — ABNORMAL HIGH (ref 70–99)
Glucose-Capillary: 101 mg/dL — ABNORMAL HIGH (ref 70–99)
Glucose-Capillary: 107 mg/dL — ABNORMAL HIGH (ref 70–99)
Glucose-Capillary: 110 mg/dL — ABNORMAL HIGH (ref 70–99)

## 2023-02-11 NOTE — Progress Notes (Addendum)
    Providing Compassionate, Quality Care - Together   NEUROSURGERY PROGRESS NOTE     S: No issues overnight.    O: EXAM:  BP 123/71 (BP Location: Left Arm)   Pulse 77   Temp 98.5 F (36.9 C) (Oral)   Resp 18   Ht 5\' 5"  (1.651 m)   Wt (!) 158 kg   SpO2 96%   BMI 57.96 kg/m     Awake, alert, oriented  Speech fluent, appropriate  Strength grossly intact BUE/BLE    ASSESSMENT:  53 y.o. with   -Hardware loosening, migration w h/o L4-5 decompression/fusion Dec 2024.     PLAN: -Plan for OR Monday for revision -NPO midnight 2/3 -Call w/ questions/concerns   Patrici Ranks, [facility]

## 2023-02-11 NOTE — Plan of Care (Signed)
   Problem: Clinical Measurements: Goal: Ability to maintain clinical measurements within normal limits will improve Outcome: Progressing   Problem: Clinical Measurements: Goal: Will remain free from infection Outcome: Progressing

## 2023-02-12 LAB — GLUCOSE, CAPILLARY
Glucose-Capillary: 101 mg/dL — ABNORMAL HIGH (ref 70–99)
Glucose-Capillary: 104 mg/dL — ABNORMAL HIGH (ref 70–99)
Glucose-Capillary: 109 mg/dL — ABNORMAL HIGH (ref 70–99)
Glucose-Capillary: 118 mg/dL — ABNORMAL HIGH (ref 70–99)
Glucose-Capillary: 118 mg/dL — ABNORMAL HIGH (ref 70–99)
Glucose-Capillary: 121 mg/dL — ABNORMAL HIGH (ref 70–99)

## 2023-02-12 MED ORDER — ORAL CARE MOUTH RINSE: 15.0000 mL | OROMUCOSAL | Status: DC | PRN

## 2023-02-12 MED ORDER — INSULIN ASPART 100 UNIT/ML IJ SOLN
0.0000 [IU] | Freq: Three times a day (TID) | INTRAMUSCULAR | Status: DC
Start: 2023-02-12 — End: 2023-02-19
  Administered 2023-02-12 – 2023-02-18 (×6): 3 [IU] via SUBCUTANEOUS

## 2023-02-12 NOTE — Plan of Care (Signed)
Pain has been managed with his PRN doses.  On schedule for surgery in the morning.  NPO midnight.

## 2023-02-12 NOTE — Progress Notes (Signed)
    Providing Compassionate, Quality Care - Together   NEUROSURGERY PROGRESS NOTE     S: No issues overnight.    O: EXAM:  BP (!) 114/51 (BP Location: Left Arm)   Pulse 65   Temp 98 F (36.7 C) (Oral)   Resp 17   Ht 5\' 5"  (1.651 m)   Wt (!) 158 kg   SpO2 92%   BMI 57.96 kg/m       Awake, alert, oriented  Speech fluent, appropriate  Strength grossly intact BUE/BLE     ASSESSMENT:  53 y.o. with    -Hardware loosening, migration w/ h/o L4-5 decompression/fusion Dec 2024.      PLAN: -Plan for OR tmrw for revision -NPO midnight  -Call w/ questions/concerns   Patrici Ranks, [facility]

## 2023-02-12 NOTE — Plan of Care (Signed)

## 2023-02-12 NOTE — Anesthesia Preprocedure Evaluation (Signed)
Anesthesia Evaluation  Patient identified by MRN, date of birth, ID band Patient awake    Reviewed: Allergy & Precautions, H&P , NPO status , Patient's Chart, lab work & pertinent test results, reviewed documented beta blocker date and time   Airway Mallampati: IV  TM Distance: <3 FB Neck ROM: Full    Dental  (+) Teeth Intact, Dental Advisory Given   Pulmonary asthma , sleep apnea and Continuous Positive Airway Pressure Ventilation , former smoker   breath sounds clear to auscultation       Cardiovascular hypertension, Pt. on medications and Pt. on home beta blockers  Rhythm:Regular Rate:Normal  Cardiac cath 10/16/17: Conclusions: 1. No angiographically significant coronary artery disease. 2. Normal left ventricular systolic function. 3. Mild elevated left ventricular filling pressure with marked respiratory variation.      Neuro/Psych negative neurological ROS  negative psych ROS   GI/Hepatic Neg liver ROS, hiatal hernia,GERD  Medicated,,  Endo/Other  diabetes, Type 2, Oral Hypoglycemic Agents  Class 4 obesity  Renal/GU negative Renal ROS  negative genitourinary   Musculoskeletal  (+) Arthritis , Osteoarthritis,    Abdominal   Peds  Hematology negative hematology ROS (+)   Anesthesia Other Findings PREVIOUS GLIDE Preoxygenation: Pre-oxygenation with 100% oxygen Induction Type: IV induction and Rapid sequence Laryngoscope Size: Glidescope and 4 Grade View: Grade I Tube type: Oral Tube size: 8.0 mm Number of attempts: 1 Airway Equipment and Method: Rigid stylet and Video-laryngoscopy Placement Confirmation: ETT inserted through vocal cords under direct vision, positive ETCO2 and breath sounds checked- equal and bilateral Secured at: 24 cm Tube secured with: Tape Dental Injury: Teeth and Oropharynx as per pre-operative assessment  Difficulty Due To: Difficulty was anticipated, Difficult Airway- due to large  tongue and Difficult Airway- due to limited oral opening Comments: Glidescope 4 used.  Grade 1 view but lots of redundant tissue.  8.0 ETT passed and EBBS.   Reproductive/Obstetrics negative OB ROS                              Anesthesia Physical Anesthesia Plan  ASA: 3  Anesthesia Plan: General   Post-op Pain Management: Ofirmev IV (intra-op)* and Dilaudid IV   Induction: Intravenous  PONV Risk Score and Plan: 3 and Ondansetron, Dexamethasone and Midazolam  Airway Management Planned: Oral ETT and Video Laryngoscope Planned  Additional Equipment: None  Intra-op Plan:   Post-operative Plan: Extubation in OR  Informed Consent: I have reviewed the patients History and Physical, chart, labs and discussed the procedure including the risks, benefits and alternatives for the proposed anesthesia with the patient or authorized representative who has indicated his/her understanding and acceptance.     Dental advisory given  Plan Discussed with: CRNA and Anesthesiologist  Anesthesia Plan Comments: (PAT note written 12/27/2022 by Shonna Chock, PA-C.        DISCUSSION: Patient is a 53 year old male scheduled for the above procedure.   History includes former smoker (quit 09/23/95), HTN, HLD, GERD, hiatal hernia, DM2, neuropathy, OSA (moderate OSA 2019, does not use CPAP), arthritis. No CAD by 10/17/27 LHC. BMI is consistent with morbid obesity.    He denied chest pain, SOB, cough, fever at PAT RN visit.  A1c 6.3%.  Anesthesia team to evaluate on the day of surgery.     VS: BP (!) 159/87   Pulse (!) 54   Temp 36.8 C   Resp 19   Ht 5\' 5"  (1.651 m)  Wt (!) 162.1 kg   SpO2 96%   BMI 59.47 kg/m      PROVIDERS: Wilmon Pali, FNP is PCP  - He is no routinely followed by cardiology, but he was evaluated by Nona Dell, MD on 10/11/17 for chest pain. He had a LHC on 10/16/17 that showed no CAD, normal LV systolic function, mildly elevated LV  filling pressure with marked respiratory variation. Continue primary prevention, diuretic therapy, and OSA evaluation recommended.  - He had vascular surgery evaluation by Gretta Began, MD on 11/06/2017.  No evidence of arterial insufficiency found.  As needed follow-up recommended. - He had OSA evaluation by pulmonologist Virl Diamond, MD on 11/17/17. 12/04/17 HST showed moderate OSA, severe oxygen desaturations.  )        Anesthesia Quick Evaluation

## 2023-02-13 ENCOUNTER — Inpatient Hospital Stay (HOSPITAL_COMMUNITY): Payer: Medicaid Other

## 2023-02-13 ENCOUNTER — Inpatient Hospital Stay (HOSPITAL_COMMUNITY): Payer: Medicaid Other | Admitting: Certified Registered"

## 2023-02-13 ENCOUNTER — Encounter (HOSPITAL_COMMUNITY)
Disposition: A | Discharge status: Skilled Nursing Facility | Payer: Self-pay | Source: Other Acute Inpatient Hospital | Attending: Neurosurgery

## 2023-02-13 ENCOUNTER — Inpatient Hospital Stay (HOSPITAL_COMMUNITY): Payer: Self-pay | Admitting: Certified Registered"

## 2023-02-13 ENCOUNTER — Other Ambulatory Visit: Payer: Self-pay

## 2023-02-13 ENCOUNTER — Encounter (HOSPITAL_COMMUNITY): Payer: Self-pay | Admitting: Neurosurgery

## 2023-02-13 DIAGNOSIS — T8484XA Pain due to internal orthopedic prosthetic devices, implants and grafts, initial encounter: Secondary | ICD-10-CM

## 2023-02-13 DIAGNOSIS — Z87891 Personal history of nicotine dependence: Secondary | ICD-10-CM

## 2023-02-13 DIAGNOSIS — I10 Essential (primary) hypertension: Secondary | ICD-10-CM | POA: Diagnosis not present

## 2023-02-13 DIAGNOSIS — J45909 Unspecified asthma, uncomplicated: Secondary | ICD-10-CM

## 2023-02-13 HISTORY — PX: APPLICATION OF WOUND VAC: SHX5189

## 2023-02-13 HISTORY — PX: POSTERIOR FUSION PEDICLE SCREW PLACEMENT: SHX2186

## 2023-02-13 LAB — APTT: aPTT: 42 s — ABNORMAL HIGH (ref 24–36)

## 2023-02-13 LAB — GLUCOSE, CAPILLARY
Glucose-Capillary: 114 mg/dL — ABNORMAL HIGH (ref 70–99)
Glucose-Capillary: 145 mg/dL — ABNORMAL HIGH (ref 70–99)
Glucose-Capillary: 83 mg/dL (ref 70–99)
Glucose-Capillary: 96 mg/dL (ref 70–99)
Glucose-Capillary: 98 mg/dL (ref 70–99)

## 2023-02-13 LAB — PROTIME-INR
INR: 1.1 (ref 0.8–1.2)
Prothrombin Time: 14.1 s (ref 11.4–15.2)

## 2023-02-13 LAB — SURGICAL PCR SCREEN
MRSA, PCR: NEGATIVE
Staphylococcus aureus: NEGATIVE

## 2023-02-13 SURGERY — POSTERIOR LUMBAR FUSION 1 WITH HARDWARE REMOVAL
Anesthesia: General | Site: Back

## 2023-02-13 MED ORDER — VANCOMYCIN HCL 1000 MG IV SOLR
INTRAVENOUS | Status: AC
  Filled 2023-02-13: qty 20

## 2023-02-13 MED ORDER — INSULIN ASPART 100 UNIT/ML IJ SOLN: 0.0000 [IU] | INTRAMUSCULAR | Status: DC | PRN

## 2023-02-13 MED ORDER — MEPERIDINE HCL 25 MG/ML IJ SOLN: 6.2500 mg | INTRAMUSCULAR | Status: DC | PRN

## 2023-02-13 MED ORDER — ACETAMINOPHEN 10 MG/ML IV SOLN
1000.0000 mg | Freq: Once | INTRAVENOUS | Status: AC
  Administered 2023-02-13: 1000 mg via INTRAVENOUS

## 2023-02-13 MED ORDER — ONDANSETRON HCL 4 MG/2ML IJ SOLN
INTRAMUSCULAR | Status: DC | PRN
  Administered 2023-02-13: 4 mg via INTRAVENOUS

## 2023-02-13 MED ORDER — FENTANYL CITRATE (PF) 100 MCG/2ML IJ SOLN
INTRAMUSCULAR | Status: AC
  Filled 2023-02-13: qty 2

## 2023-02-13 MED ORDER — LIDOCAINE-EPINEPHRINE 1 %-1:100000 IJ SOLN
INTRAMUSCULAR | Status: AC
  Filled 2023-02-13: qty 1

## 2023-02-13 MED ORDER — ACETAMINOPHEN 160 MG/5ML PO SOLN
325.0000 mg | ORAL | Status: DC | PRN
Start: 2023-02-13 — End: 2023-02-13

## 2023-02-13 MED ORDER — CEFAZOLIN SODIUM 1 G IJ SOLR
INTRAMUSCULAR | Status: AC
  Filled 2023-02-13: qty 30

## 2023-02-13 MED ORDER — 0.9 % SODIUM CHLORIDE (POUR BTL) OPTIME
TOPICAL | Status: DC | PRN
  Administered 2023-02-13 (×2): 1000 mL

## 2023-02-13 MED ORDER — PROPOFOL 10 MG/ML IV BOLUS
INTRAVENOUS | Status: DC | PRN
  Administered 2023-02-13: 50 mg via INTRAVENOUS
  Administered 2023-02-13: 150 mg via INTRAVENOUS

## 2023-02-13 MED ORDER — CHLORHEXIDINE GLUCONATE 0.12 % MT SOLN
OROMUCOSAL | Status: AC
  Administered 2023-02-13: 15 mL via OROMUCOSAL
  Filled 2023-02-13: qty 15

## 2023-02-13 MED ORDER — LIDOCAINE 2% (20 MG/ML) 5 ML SYRINGE
INTRAMUSCULAR | Status: DC | PRN
  Administered 2023-02-13: 60 mg via INTRAVENOUS

## 2023-02-13 MED ORDER — HYDROMORPHONE HCL 1 MG/ML IJ SOLN
0.2500 mg | INTRAMUSCULAR | Status: DC | PRN
  Administered 2023-02-13: 0.5 mg via INTRAVENOUS

## 2023-02-13 MED ORDER — LIDOCAINE-EPINEPHRINE 1 %-1:100000 IJ SOLN
INTRAMUSCULAR | Status: DC | PRN
  Administered 2023-02-13: 5 mL

## 2023-02-13 MED ORDER — MIDAZOLAM HCL 2 MG/2ML IJ SOLN
INTRAMUSCULAR | Status: DC | PRN
  Administered 2023-02-13: 2 mg via INTRAVENOUS

## 2023-02-13 MED ORDER — PHENYLEPHRINE 80 MCG/ML (10ML) SYRINGE FOR IV PUSH (FOR BLOOD PRESSURE SUPPORT)
PREFILLED_SYRINGE | INTRAVENOUS | Status: DC | PRN
  Administered 2023-02-13: 80 ug via INTRAVENOUS

## 2023-02-13 MED ORDER — FENTANYL CITRATE (PF) 100 MCG/2ML IJ SOLN
25.0000 ug | INTRAMUSCULAR | Status: DC | PRN
  Administered 2023-02-13 (×2): 50 ug via INTRAVENOUS

## 2023-02-13 MED ORDER — THROMBIN 5000 UNITS EX SOLR
CUTANEOUS | Status: AC
  Filled 2023-02-13: qty 5000

## 2023-02-13 MED ORDER — OXYCODONE HCL 5 MG/5ML PO SOLN: 5.0000 mg | Freq: Once | ORAL | Status: AC | PRN

## 2023-02-13 MED ORDER — BUPIVACAINE HCL (PF) 0.5 % IJ SOLN
INTRAMUSCULAR | Status: AC
  Filled 2023-02-13: qty 30

## 2023-02-13 MED ORDER — ROCURONIUM BROMIDE 10 MG/ML (PF) SYRINGE
PREFILLED_SYRINGE | INTRAVENOUS | Status: DC | PRN
  Administered 2023-02-13: 30 mg via INTRAVENOUS
  Administered 2023-02-13: 50 mg via INTRAVENOUS

## 2023-02-13 MED ORDER — HYDROMORPHONE HCL 1 MG/ML IJ SOLN
INTRAMUSCULAR | Status: AC
  Filled 2023-02-13: qty 0.5

## 2023-02-13 MED ORDER — BACITRACIN ZINC 500 UNIT/GM EX OINT
TOPICAL_OINTMENT | CUTANEOUS | Status: DC | PRN
  Administered 2023-02-13: 1 via TOPICAL

## 2023-02-13 MED ORDER — SUCCINYLCHOLINE CHLORIDE 200 MG/10ML IV SOSY
PREFILLED_SYRINGE | INTRAVENOUS | Status: DC | PRN
  Administered 2023-02-13: 120 mg via INTRAVENOUS

## 2023-02-13 MED ORDER — DEXTROSE 5 % IV SOLN
INTRAVENOUS | Status: DC | PRN
  Administered 2023-02-13: 3 g via INTRAVENOUS

## 2023-02-13 MED ORDER — PROPOFOL 10 MG/ML IV BOLUS
INTRAVENOUS | Status: AC
  Filled 2023-02-13: qty 20

## 2023-02-13 MED ORDER — ONDANSETRON HCL 4 MG/2ML IJ SOLN
INTRAMUSCULAR | Status: AC
  Filled 2023-02-13: qty 2

## 2023-02-13 MED ORDER — VANCOMYCIN HCL 1000 MG IV SOLR
INTRAVENOUS | Status: DC | PRN
  Administered 2023-02-13: 1000 mg via TOPICAL

## 2023-02-13 MED ORDER — FENTANYL CITRATE (PF) 250 MCG/5ML IJ SOLN
INTRAMUSCULAR | Status: DC | PRN
  Administered 2023-02-13: 100 ug via INTRAVENOUS

## 2023-02-13 MED ORDER — ACETAMINOPHEN 10 MG/ML IV SOLN
INTRAVENOUS | Status: AC
  Filled 2023-02-13: qty 100

## 2023-02-13 MED ORDER — THROMBIN 5000 UNITS EX SOLR: OROMUCOSAL | Status: DC | PRN

## 2023-02-13 MED ORDER — BUPIVACAINE HCL (PF) 0.5 % IJ SOLN
INTRAMUSCULAR | Status: DC | PRN
  Administered 2023-02-13: 5 mL

## 2023-02-13 MED ORDER — SUGAMMADEX SODIUM 200 MG/2ML IV SOLN
INTRAVENOUS | Status: DC | PRN
  Administered 2023-02-13: 400 mg via INTRAVENOUS

## 2023-02-13 MED ORDER — ONDANSETRON HCL 4 MG/2ML IJ SOLN: 4.0000 mg | Freq: Once | INTRAMUSCULAR | Status: DC | PRN

## 2023-02-13 MED ORDER — OXYCODONE HCL 5 MG PO TABS
ORAL_TABLET | ORAL | Status: AC
  Filled 2023-02-13: qty 1

## 2023-02-13 MED ORDER — LACTATED RINGERS IV SOLN: INTRAVENOUS | Status: DC

## 2023-02-13 MED ORDER — BACITRACIN ZINC 500 UNIT/GM EX OINT
TOPICAL_OINTMENT | CUTANEOUS | Status: AC
  Filled 2023-02-13: qty 28.35

## 2023-02-13 MED ORDER — ORAL CARE MOUTH RINSE: 15.0000 mL | Freq: Once | OROMUCOSAL | Status: AC

## 2023-02-13 MED ORDER — FENTANYL CITRATE (PF) 250 MCG/5ML IJ SOLN
INTRAMUSCULAR | Status: AC
  Filled 2023-02-13: qty 5

## 2023-02-13 MED ORDER — HYDROMORPHONE HCL 1 MG/ML IJ SOLN
INTRAMUSCULAR | Status: DC | PRN
  Administered 2023-02-13: .5 mg via INTRAVENOUS

## 2023-02-13 MED ORDER — ACETAMINOPHEN 325 MG PO TABS: 325.0000 mg | ORAL_TABLET | ORAL | Status: DC | PRN

## 2023-02-13 MED ORDER — PHENYLEPHRINE 80 MCG/ML (10ML) SYRINGE FOR IV PUSH (FOR BLOOD PRESSURE SUPPORT)
PREFILLED_SYRINGE | INTRAVENOUS | Status: AC
  Filled 2023-02-13: qty 10

## 2023-02-13 MED ORDER — MIDAZOLAM HCL 2 MG/2ML IJ SOLN
INTRAMUSCULAR | Status: AC
  Filled 2023-02-13: qty 2

## 2023-02-13 MED ORDER — CHLORHEXIDINE GLUCONATE 0.12 % MT SOLN: 15.0000 mL | Freq: Once | OROMUCOSAL | Status: AC

## 2023-02-13 MED ORDER — OXYCODONE HCL 5 MG PO TABS
5.0000 mg | ORAL_TABLET | Freq: Once | ORAL | Status: AC | PRN
  Administered 2023-02-13: 5 mg via ORAL

## 2023-02-13 MED ORDER — HYDROMORPHONE HCL 1 MG/ML IJ SOLN
INTRAMUSCULAR | Status: AC
  Filled 2023-02-13: qty 1

## 2023-02-13 SURGICAL SUPPLY — 62 items
BAG COUNTER SPONGE SURGICOUNT (BAG) ×3 IMPLANT
BASKET BONE COLLECTION (BASKET) ×3 IMPLANT
BENZOIN TINCTURE PRP APPL 2/3 (GAUZE/BANDAGES/DRESSINGS) IMPLANT
BLADE CLIPPER SURG (BLADE) IMPLANT
BLADE SURG 11 STRL SS (BLADE) ×3 IMPLANT
BUR MATCHSTICK NEURO 3.0 LAGG (BURR) ×3 IMPLANT
BUR PRECISION FLUTE 5.0 (BURR) ×3 IMPLANT
CANISTER SUCT 3000ML PPV (MISCELLANEOUS) ×3 IMPLANT
CNTNR URN SCR LID CUP LEK RST (MISCELLANEOUS) ×3 IMPLANT
COVER BACK TABLE 60X90IN (DRAPES) ×3 IMPLANT
DERMABOND ADVANCED .7 DNX12 (GAUZE/BANDAGES/DRESSINGS) ×3 IMPLANT
DRAPE C-ARM 42X72 X-RAY (DRAPES) ×3 IMPLANT
DRAPE C-ARMOR (DRAPES) ×3 IMPLANT
DRAPE LAPAROTOMY 100X72X124 (DRAPES) ×3 IMPLANT
DRAPE SURG 17X23 STRL (DRAPES) ×3 IMPLANT
DRESSING PEEL AND PLC PRVNA 13 (GAUZE/BANDAGES/DRESSINGS) IMPLANT
DRSG PEEL AND PLACE PREVENA 13 (GAUZE/BANDAGES/DRESSINGS) ×2
DURAPREP 26ML APPLICATOR (WOUND CARE) ×3 IMPLANT
ELECT REM PT RETURN 9FT ADLT (ELECTROSURGICAL) ×2
ELECTRODE REM PT RTRN 9FT ADLT (ELECTROSURGICAL) ×3 IMPLANT
GAUZE 4X4 16PLY ~~LOC~~+RFID DBL (SPONGE) IMPLANT
GAUZE SPONGE 4X4 12PLY STRL (GAUZE/BANDAGES/DRESSINGS) IMPLANT
GLOVE BIOGEL PI IND STRL 7.5 (GLOVE) ×6 IMPLANT
GLOVE ECLIPSE 7.0 STRL STRAW (GLOVE) ×6 IMPLANT
GLOVE EXAM NITRILE XL STR (GLOVE) IMPLANT
GOWN STRL REUS W/ TWL LRG LVL3 (GOWN DISPOSABLE) ×6 IMPLANT
GOWN STRL REUS W/ TWL XL LVL3 (GOWN DISPOSABLE) IMPLANT
GOWN STRL REUS W/TWL 2XL LVL3 (GOWN DISPOSABLE) IMPLANT
HEMOSTAT POWDER KIT SURGIFOAM (HEMOSTASIS) ×3 IMPLANT
KIT BASIN OR (CUSTOM PROCEDURE TRAY) ×3 IMPLANT
KIT POSITION SURG JACKSON T1 (MISCELLANEOUS) ×3 IMPLANT
KIT PREVENA INCISION MGT 13 (CANNISTER) IMPLANT
KIT TURNOVER KIT B (KITS) ×3 IMPLANT
MILL BONE PREP (MISCELLANEOUS) ×3 IMPLANT
NDL HYPO 18GX1.5 BLUNT FILL (NEEDLE) IMPLANT
NDL HYPO 22X1.5 SAFETY MO (MISCELLANEOUS) ×3 IMPLANT
NDL SPNL 18GX3.5 QUINCKE PK (NEEDLE) IMPLANT
NEEDLE HYPO 18GX1.5 BLUNT FILL (NEEDLE)
NEEDLE HYPO 22X1.5 SAFETY MO (MISCELLANEOUS) ×2
NEEDLE SPNL 18GX3.5 QUINCKE PK (NEEDLE)
NS IRRIG 1000ML POUR BTL (IV SOLUTION) ×3 IMPLANT
PACK LAMINECTOMY NEURO (CUSTOM PROCEDURE TRAY) ×3 IMPLANT
PAD ARMBOARD 7.5X6 YLW CONV (MISCELLANEOUS) ×9 IMPLANT
ROD CC 30MM (Rod) IMPLANT
ROD SPNL CVD 40X4.75X (Rod) IMPLANT
SCREW MAS 8.5X40 (Screw) IMPLANT
SCREW MAS 8.5X45 (Screw) IMPLANT
SCREW SET SOLERA TI (Screw) IMPLANT
SCREW SOLERA 7.5X40MM (Screw) IMPLANT
SPIKE FLUID TRANSFER (MISCELLANEOUS) ×3 IMPLANT
SPONGE SURGIFOAM ABS GEL 100 (HEMOSTASIS) IMPLANT
SPONGE T-LAP 4X18 ~~LOC~~+RFID (SPONGE) IMPLANT
STRIP CLOSURE SKIN 1/2X4 (GAUZE/BANDAGES/DRESSINGS) IMPLANT
SUT VIC AB 0 CT1 18XCR BRD8 (SUTURE) ×3 IMPLANT
SUT VICRYL 3-0 RB1 18 ABS (SUTURE) ×3 IMPLANT
SWAB COLLECTION DEVICE MRSA (MISCELLANEOUS) IMPLANT
SWAB CULTURE ESWAB REG 1ML (MISCELLANEOUS) IMPLANT
SYR 3ML LL SCALE MARK (SYRINGE) ×9 IMPLANT
TOWEL GREEN STERILE (TOWEL DISPOSABLE) ×3 IMPLANT
TOWEL GREEN STERILE FF (TOWEL DISPOSABLE) ×3 IMPLANT
TRAY FOLEY MTR SLVR 16FR STAT (SET/KITS/TRAYS/PACK) ×3 IMPLANT
WATER STERILE IRR 1000ML POUR (IV SOLUTION) ×3 IMPLANT

## 2023-02-13 NOTE — Anesthesia Postprocedure Evaluation (Signed)
Anesthesia Post Note  Patient: John Cook  Procedure(s) Performed: REVISION OF LUMBAR FOUR-FIVE HARDWARE APPLICATION OF WOUND VAC (Back)     Patient location during evaluation: PACU Anesthesia Type: General Level of consciousness: awake and alert Pain management: pain level controlled Vital Signs Assessment: post-procedure vital signs reviewed and stable Respiratory status: spontaneous breathing, nonlabored ventilation, respiratory function stable and patient connected to nasal cannula oxygen Cardiovascular status: blood pressure returned to baseline and stable Postop Assessment: no apparent nausea or vomiting Anesthetic complications: yes   Encounter Notable Events  Notable Event Outcome Phase Comment  Difficult to intubate - expected  Intraprocedure Filed from anesthesia note documentation.    Last Vitals:  Vitals:   02/13/23 1100 02/13/23 1115  BP: 113/69 125/80  Pulse: 71 70  Resp: (!) 8 14  Temp:  36.7 C  SpO2: 92% 94%    Last Pain:  Vitals:   02/13/23 1115  TempSrc:   PainSc: Asleep    LLE Motor Response: Purposeful movement (02/13/23 1115) LLE Sensation: Full sensation (02/13/23 1115) RLE Motor Response: Purposeful movement (02/13/23 1115) RLE Sensation: Full sensation (02/13/23 1115)      Shavonda Wiedman

## 2023-02-13 NOTE — Anesthesia Procedure Notes (Signed)
Procedure Name: Intubation Date/Time: 02/13/2023 7:57 AM  Performed by: Gus Puma, CRNAPre-anesthesia Checklist: Patient identified, Emergency Drugs available, Suction available and Patient being monitored Patient Re-evaluated:Patient Re-evaluated prior to induction Oxygen Delivery Method: Circle System Utilized Preoxygenation: Pre-oxygenation with 100% oxygen Induction Type: IV induction Ventilation: Two handed mask ventilation required, Oral airway inserted - appropriate to patient size and Mask ventilation with difficulty Laryngoscope Size: Glidescope and 4 Grade View: Grade I Tube type: Oral Number of attempts: 1 Airway Equipment and Method: Oral airway, Rigid stylet and Video-laryngoscopy Placement Confirmation: ETT inserted through vocal cords under direct vision, positive ETCO2 and breath sounds checked- equal and bilateral Secured at: 24 cm Tube secured with: Tape Dental Injury: Teeth and Oropharynx as per pre-operative assessment  Difficulty Due To: Difficulty was anticipated Future Recommendations: Recommend- induction with short-acting agent, and alternative techniques readily available Comments: Video laryngoscope used for intubation -- large tongue and redundant tissue throughout oropharynx -- recommend video laryngoscopy for future intubations

## 2023-02-13 NOTE — Progress Notes (Signed)
PT Cancellation Note  Patient Details Name: John Cook MRN: [id number] DOB: Sep 29, 1970   Cancelled Treatment:    Reason Eval/Treat Not Completed: Patient at procedure or test/unavailable Patient currently in OR for revision of back surgery. Will f/u as able.  Aleda Grana, PT, DPT 02/13/23, 10:03 AM  Sandi Mariscal 02/13/2023, 10:03 AM

## 2023-02-13 NOTE — Progress Notes (Signed)
OT Cancellation Note  Patient Details Name: DEMARYIUS IMRAN MRN: [id number] DOB: [birth year]   Cancelled Treatment:    Reason Eval/Treat Not Completed: Patient at procedure or test/ unavailable Patient currently in OR for revision of back surgery. OT will follow back to complete re-eval when appropriate.   Pollyann Glen E. Torrie Namba, OTR/L Acute Rehabilitation Services [phone]   Cherlyn Cushing 02/13/2023, 8:25 AM

## 2023-02-13 NOTE — Progress Notes (Signed)
Pt ready for surgery.  Belongings left in room.  Report has been called to CRNA Tayvia.  Pt transported to short stay via bed with transport tech. Hilton Sinclair BSN RN CMSRN 02/13/2023, 6:44 AM

## 2023-02-13 NOTE — Transfer of Care (Signed)
Immediate Anesthesia Transfer of Care Note  Patient: John Cook  Procedure(s) Performed: REVISION OF LUMBAR FOUR-FIVE HARDWARE APPLICATION OF WOUND VAC (Back)  Patient Location: PACU  Anesthesia Type:General  Level of Consciousness: awake, drowsy, and patient cooperative  Airway & Oxygen Therapy: Patient Spontanous Breathing and Patient connected to nasal cannula oxygen  Post-op Assessment: Report given to RN, Post -op Vital signs reviewed and stable, and Patient moving all extremities X 4  Post vital signs: Reviewed and stable  Last Vitals:  Vitals Value Taken Time  BP 128/103 02/13/23 1024  Temp    Pulse 75 02/13/23 1028  Resp 21 02/13/23 1028  SpO2 99 % 02/13/23 1028  Vitals shown include unfiled device data.  Last Pain:  Vitals:   02/13/23 0707  TempSrc: Oral  PainSc:       Patients Stated Pain Goal: 0 (02/12/23 0422)  Complications:  Encounter Notable Events  Notable Event Outcome Phase Comment  Difficult to intubate - expected  Intraprocedure Filed from anesthesia note documentation.

## 2023-02-13 NOTE — Op Note (Signed)
NEUROSURGERY OPERATIVE NOTE   PREOP DIAGNOSIS:  Lumbar Hardware Failure   POSTOP DIAGNOSIS: Same  PROCEDURE: Removal of previous pedicle screws/rod, exploration of fusion Posterior non-segmental instrumentation, L4-5 - [employer] Pedicle screws  SURGEON: Dr. Lisbeth Renshaw, MD  ASSISTANT: Dr. Julio Sicks, MD  ANESTHESIA: General Endotracheal  EBL: 100cc  SPECIMENS: subfacial culture aerobic/anaerobic culture swabs  DRAINS: Prevena closed negative pressure dressing  COMPLICATIONS: None immediate  CONDITION: Hemodynamically stable to PACU  HISTORY: John Cook is a 53 y.o. male tree of chronic back and leg pain who underwent previous instrumented decompression and stabilization fusion about 1 month ago.  Patient did well for a week or 2, but presented back to the hospital with intractable back pain and inability to ambulate.  His imaging included CT scan which demonstrated loosening of the L4 pedicle screws, and cage migration.  He therefore elected to proceed with revision of hardware.  The risks, benefits, and alternatives to surgery were all reviewed in detail with the patient.  After all his questions were answered informed consent was obtained and witnessed.  PROCEDURE IN DETAIL: The patient was brought to the operating room. After induction of general anesthesia, the patient was positioned on the operative table in the prone position. All pressure points were meticulously padded.  Previous skin incision was then marked out and prepped and draped in the usual sterile fashion.  After timeout was conducted, the previous incision was infiltrated with local anesthetic with epinephrine.  The incision was then made sharply and carried down through the subcutaneous tissue and scar.  The lumbodorsal fascia was identified and opened.  The muscle layer was identified and divided in the midline.  There is a small amount of what appeared to be serous fluid in the subfascial space.  Culture  swabs were taken of this material.  The previous screws were identified and self-retaining retractors were placed.  The setscrews were removed and the previous intervening rod was removed.  The previous L4 pedicle screws were then grasped with a rongeur and noted to be extremely loose.  The L4 pedicle screws bilaterally were removed in this fashion.  I then checked the L5 screws.  The right sided L5 screw also appeared to be quite loose with minimal purchase and was removed.  The left L5 pedicle screw appeared to be a in satisfactory position with good purchase.  This was left in place.  At this point I used a dissector in the previous screw trajectories on the right at L5 and on the right at L4.  I upsized screws to 8.5 x 40 mm at L5 and same length screw on the right at L4.  Utilizing the high-speed drill, I identified a new entry point for the left L4 pedicle screws with a more standard trajectory.  Bovie was used for subperiosteal dissection along the lateral aspect of the left L4-5 facet complex and the proximal aspect of the left L4 transverse process was identified.  Entry point was then identified using standard anatomic landmarks as well as AP and lateral fluoroscopy.  A probe was then used to create a trajectory into the left L4 pedicle.  This was checked with AP and lateral fluoroscopy.  The pilot hole was then tapped sequentially with 5 5 and 6 5 taps.  An eight 5 x 45 mm pedicle screw was then placed.  Final AP and lateral fluoroscopic images demonstrated good position of the pedicle screw construct.  At this point a pre-bent 30 mm rod on  the right and 40 mm rod on the left was placed.  Setscrews were then placed and final tightened.  The wound was then irrigated with normal saline.  Self-retaining retractor was then removed.  Vancomycin powder was placed in the subfascial space.  The muscle and fascia were then closed with interrupted 0 Vicryl stitches.  The remainder of the vancomycin powder was  placed in the subcutaneous space.  Subcutaneous tissue was then closed in multiple layers using a combination of interrupted 0 and 3-0 Vicryl stitches.  Bacitracin ointment and a Prevena negative pressure wound dressing was placed.  Good suction was maintained on the dressing.  Patient was then transferred to the stretcher, extubated, and taken to the postanesthesia care unit in stable hemodynamic condition.  At the end of the case all sponge, needle, instrument, and cottonoid counts were correct.   Lisbeth Renshaw, MD [facility]

## 2023-02-13 NOTE — Progress Notes (Signed)
  NEUROSURGERY PROGRESS NOTE   No issues overnight. unchanged mechanical back pain  EXAM:  BP 129/64   Pulse 63   Temp 98.3 F (36.8 C) (Oral)   Resp 20   Ht 5\' 5"  (1.651 m)   Wt (!) 158 kg   SpO2 96%   BMI 57.96 kg/m   Awake, alert, oriented  Speech fluent, appropriate  CN grossly intact  5/5 BUE/BLE   IMPRESSION:  53 y.o. male readmitted with back pain related to cage migration and L4 screw loosening  PLAN: - will proceed with revision of hardware this am  I have reviewed the indications for the procedure as well as the details of the procedure and the expected postoperative course and recovery at length with the patient. We have also reviewed in detail the risks, benefits, and alternatives to the procedure. All questions were answered and Otho Perl provided informed consent to proceed.  Lisbeth Renshaw, MD [facility]

## 2023-02-14 ENCOUNTER — Encounter (HOSPITAL_COMMUNITY): Payer: Self-pay | Admitting: Neurosurgery

## 2023-02-14 LAB — GLUCOSE, CAPILLARY
Glucose-Capillary: 104 mg/dL — ABNORMAL HIGH (ref 70–99)
Glucose-Capillary: 125 mg/dL — ABNORMAL HIGH (ref 70–99)
Glucose-Capillary: 148 mg/dL — ABNORMAL HIGH (ref 70–99)
Glucose-Capillary: 134 mg/dL — ABNORMAL HIGH (ref 70–99)
Glucose-Capillary: 106 mg/dL — ABNORMAL HIGH (ref 70–99)
Glucose-Capillary: 121 mg/dL — ABNORMAL HIGH (ref 70–99)

## 2023-02-14 NOTE — Evaluation (Signed)
 Physical Therapy RE-Evaluation Patient Details Name: John Cook MRN: [id number] DOB: 06/15/1970 Today's Date: 02/14/2023  History of Present Illness  53 yo male with worsening back pain with movement over the past few weeks. Transferred from [facility] after CT shows cage migration in the disc space since his L4-L5 PLIF 12/29/22. Pt reports multiple falls post op, but has still been ambulating while using his brace.  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 Discusses that there are not too many more options. Pt agreeable to work with therapy. Pt needs reminding of back precautions especially during bed mobility as he tends to twist his spine in rolling and coming to seated EoB needing min A for steadying. Once up pt reports feeling dizzy. Pt with symptomatic low BP(see below) so session limited to coming to standing from significantly elevated bed and stepping towards HoB to return to supine with min A for steadying. Pt returns to supine with contact guard assist and further instruction not to twist. D/c plans remain appropriate at this time. PT will continue to follow acutely.   Orthostatic BPs  Sitting 84/61  Sitting after 3 min 108/60  After return to supine  116/62       If plan is discharge home, recommend the following: A little help with walking and/or transfers;Two people to help with bathing/dressing/bathroom;Assistance with cooking/housework;Direct supervision/assist for medications management;Direct supervision/assist for financial management;Assist for transportation;Help with stairs or ramp for entrance   Can travel by private vehicle   No    Equipment Recommendations None  recommended by PT     Functional Status Assessment Patient has had a recent decline in their functional status and demonstrates the ability to make significant improvements in function in a reasonable and predictable amount of time.     Precautions / Restrictions Precautions Precautions: Back;Fall Precaution Comments: verbally reviewed, watch BP, new drain with wound vac Required Braces or Orthoses: Spinal Brace Spinal Brace: Lumbar corset Restrictions Weight Bearing Restrictions Per Provider Order: No      Mobility  Bed Mobility Overal bed mobility: Needs Assistance Bed Mobility: Supine to Sit, Sit to Supine     Supine to sit: Min assist Sit to supine: Contact guard assist   General bed mobility comments: increased time and effort, use of momentum to roll over and push up into seated, cues for not twisting with getting back in bed , contact guard to insure that LE made it all the way back in bed    Transfers Overall transfer level: Needs assistance Equipment used: Rolling walker (2 wheels) Transfers: Sit to/from Stand, Bed to chair/wheelchair/BSC Sit to Stand: Contact guard assist, From elevated surface          Lateral/Scoot Transfers: Min assist General transfer comment: bed elevated significantly and pt was able to push up to standing, and take lateral steps towards HoB with min A for steadying    Ambulation/Gait               General Gait Details: deferred due to symptomatic low BP        Balance Overall balance assessment: Needs assistance Sitting-balance support: Feet supported, No upper extremity supported Sitting balance-Leahy Scale: Good     Standing balance  support: Bilateral upper extremity supported, During functional activity, Reliant on assistive device for balance Standing balance-Leahy Scale: Poor                               Pertinent Vitals/Pain Pain Assessment Pain Assessment: Faces Faces Pain Scale: Hurts whole  lot Pain Location: back Pain Descriptors / Indicators: Discomfort, Guarding, Grimacing Pain Intervention(s): Limited activity within patient's tolerance, Monitored during session, Repositioned, Premedicated before session (pt with complaints that pain medication is not enough)    Home Living Family/patient expects to be discharged to:: Private residence Living Arrangements: Alone Available Help at Discharge: Other (Comment) (relies on neighbors) Type of Home: Mobile home Home Access: Ramped entrance;Other (comment)       Home Layout: One level Home Equipment: BSC/3in1      Prior Function Prior Level of Function : Independent/Modified Independent;Driving;History of Falls (last six months)             Mobility Comments: two falls in last two months ADLs Comments: prior to previous back surgery was independent and driving, now is requiring help from niegbors and is barely leaving the house     Extremity/Trunk Assessment        Lower Extremity Assessment Lower Extremity Assessment: RLE deficits/detail RLE Deficits / Details: AROM/PROM limited by pain RLE: Unable to fully assess due to pain RLE Sensation: WNL LLE Deficits / Details: AROM/PROM limited by pain LLE: Unable to fully assess due to pain LLE Sensation: WNL    Cervical / Trunk Assessment Cervical / Trunk Assessment: Back Surgery  Communication   Communication Communication: No apparent difficulties  Cognition Arousal: Alert Behavior During Therapy: WFL for tasks assessed/performed Overall Cognitive Status: Within Functional Limits for tasks assessed                                          General Comments General comments (skin integrity, edema, etc.): pt with c/o dizziness on coming to EoB by the time BP was 84/61, and dizziness was better,        Assessment/Plan    PT Assessment Patient needs continued PT services  PT Problem List Decreased strength;Decreased range of  motion;Decreased activity tolerance;Decreased balance;Decreased mobility;Decreased coordination;Pain;Obesity       PT Treatment Interventions DME instruction;Gait training;Functional mobility training;Therapeutic activities;Therapeutic exercise;Balance training;Neuromuscular re-education;Cognitive remediation;Patient/family education    PT Goals (Current goals can be found in the Care Plan section)  Acute Rehab PT Goals PT Goal Formulation: With patient Time For Goal Achievement: 02/24/23 Potential to Achieve Goals: Good    Frequency Min 1X/week        AM-PAC PT 6 Clicks Mobility  Outcome Measure Help needed turning from your back to your side while in a flat bed without using bedrails?: A Little Help needed moving from lying on your back to sitting on the side of a flat bed without using bedrails?: A Little Help needed moving to and from a bed to a chair (including a wheelchair)?: A Little Help needed standing up from a chair using your arms (e.g., wheelchair or bedside chair)?: A Little Help needed to walk in hospital room?: A Little Help needed climbing 3-5 steps with a railing? : Total 6 Click Score: 16    End of Session Equipment Utilized During Treatment: Back brace Activity Tolerance: Patient limited by pain Patient left: in  bed;with call bell/phone within reach;with bed alarm set Nurse Communication: Mobility status;Patient requests pain meds;Precautions PT Visit Diagnosis: Other abnormalities of gait and mobility (R26.89);Pain Pain - part of body:  (back and LE)    Time: 8879-8857 PT Time Calculation (min) (ACUTE ONLY): 22 min   Charges:   PT Evaluation $PT Re-evaluation: 1 Re-eval   PT General Charges $$ ACUTE PT VISIT: 1 Visit         Lylith Bebeau B. Fleeta Lapidus PT, DPT Acute Rehabilitation Services Please use secure chat or  Call Office [phone]   Almarie KATHEE Fleeta [facility] [place] 02/14/2023, 12:04 PM

## 2023-02-14 NOTE — NC FL2 (Signed)
 [facility] [place]  MEDICAID FL2 LEVEL OF CARE FORM     IDENTIFICATION  Patient Name: John Cook Birthdate: 21-Apr-1970 Sex: male Admission Date (Current Location): 02/08/2023  [facility] and [place] Number:  [employer] and Address:  [facility]. [facility], [address] [place], [place] [address]      Provider Number: [id number]  Attending Physician Name and Address:  Lanis Pupa, MD  Relative Name and Phone Number:       Current Level of Care: Hospital Recommended Level of Care: Skilled Nursing Facility Prior Approval Number:    Date Approved/Denied:   PASRR Number: [id number]  Discharge Plan: SNF    Current Diagnoses: Patient Active Problem List   Diagnosis Date Noted   Other spondylosis with radiculopathy, lumbar region 02/09/2023   Synovial cyst 12/29/2022   SOB (shortness of breath) 11/17/2017   Excessive daytime sleepiness 11/17/2017   History of snoring 11/17/2017   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    Orientation RESPIRATION BLADDER Height & Weight     Self, Time, Situation, Place  Normal Continent Weight: (!) 348 lb 5.2 oz (158 kg) Height:  5' 5 (165.1 cm)  BEHAVIORAL SYMPTOMS/MOOD NEUROLOGICAL BOWEL NUTRITION STATUS      Continent Diet (carb modified)  AMBULATORY STATUS COMMUNICATION OF NEEDS Skin   Extensive Assist Verbally Surgical wounds (closed back incision, prevena wound vac)                       Personal Care Assistance Level of Assistance  Bathing, Feeding, Dressing Bathing Assistance: Maximum assistance Feeding assistance: Independent Dressing Assistance: Maximum assistance     Functional Limitations Info             SPECIAL CARE FACTORS FREQUENCY  PT (By licensed PT), OT (By licensed OT)     PT Frequency: 5x/wk OT Frequency: 5x/wk            Contractures Contractures Info: Not present    Additional Factors Info  Code Status, Allergies Code Status Info: Full Allergies Info: Lisinopril            Current Medications (02/14/2023):  This is the current hospital active medication list Current Facility-Administered Medications  Medication Dose Route Frequency Provider Last Rate Last Admin   acetaminophen  (TYLENOL ) tablet 650 mg  650 mg Oral Q6H PRN Lanis Pupa, MD       Or   acetaminophen  (TYLENOL ) suppository 650 mg  650 mg Rectal Q6H PRN Lanis Pupa, MD       albuterol  (PROVENTIL ) (2.5 MG/3ML) 0.083% nebulizer solution 2.5 mg  2.5 mg Inhalation Q4H PRN Lanis Pupa, MD       atenolol  (TENORMIN ) tablet 50 mg  50 mg Oral Daily Nundkumar, Neelesh, MD   50 mg at 02/14/23 1007   bisacodyl  (DULCOLAX) EC tablet 5 mg  5 mg Oral Daily PRN Lanis Pupa, MD       diazepam  (VALIUM ) tablet 5 mg  5 mg Oral Q6H PRN Lanis Pupa, MD   5 mg at 02/14/23 0439   DULoxetine  (CYMBALTA ) DR capsule 30 mg  30 mg Oral BID Nundkumar, Neelesh, MD   30 mg at 02/14/23 1007   fenofibrate  tablet 160 mg  160 mg Oral Daily Nundkumar, Neelesh, MD   160 mg at 02/14/23 1008   gabapentin  (NEURONTIN ) capsule 600 mg  600 mg Oral BID Nundkumar, Neelesh, MD   600 mg at 02/14/23 1007   heparin  injection 5,000 Units  5,000 Units Subcutaneous Q8H  Lanis Pupa, MD   5,000 Units at 02/14/23 9373   insulin  aspart (novoLOG ) injection 0-20 Units  0-20 Units Subcutaneous TID AC & HS Nundkumar, Neelesh, MD   3 Units at 02/14/23 1033   magnesium  citrate solution 1 Bottle  1 Bottle Oral Once PRN Lanis Pupa, MD       metFORMIN  (GLUCOPHAGE ) tablet 500 mg  500 mg Oral Q breakfast Lanis Pupa, MD   500 mg at 02/14/23 1011   morphine  (PF) 2 MG/ML injection 2-6 mg  2-6 mg Intravenous Q1H PRN Lanis Pupa, MD   4 mg at 02/13/23 1622   ondansetron  (ZOFRAN ) tablet 4 mg  4 mg Oral Q6H PRN Lanis Pupa, MD       Or   ondansetron  (ZOFRAN ) injection 4 mg  4 mg Intravenous Q6H PRN Lanis Pupa, MD   4 mg at 02/09/23 0507   Oral care mouth rinse  15 mL Mouth Rinse PRN Lanis Pupa, MD       oxybutynin  (DITROPAN ) tablet 5 mg  5 mg Oral Daily Nundkumar, Neelesh, MD   5 mg at 02/14/23 1007   oxyCODONE  (Oxy IR/ROXICODONE ) immediate release tablet 5-10 mg  5-10 mg Oral Q4H PRN Lanis Pupa, MD   5 mg at 02/14/23 1034   pantoprazole  (PROTONIX ) EC tablet 40 mg  40 mg Oral Daily Nundkumar, Neelesh, MD   40 mg at 02/14/23 1008   rosuvastatin  (CRESTOR ) tablet 20 mg  20 mg Oral QHS Nundkumar, Neelesh, MD   20 mg at 02/13/23 2143   senna (SENOKOT) tablet 8.6 mg  1 tablet Oral BID Lanis Pupa, MD   8.6 mg at 02/14/23 1007   senna-docusate (Senokot-S) tablet 1 tablet  1 tablet Oral QHS PRN Lanis Pupa, MD       triamterene -hydrochlorothiazide  (MAXZIDE -25) 37.5-25 MG per tablet 1 tablet  1 tablet Oral Daily Nundkumar, Neelesh, MD   1 tablet at 02/14/23 1007   zolpidem  (AMBIEN ) tablet 5 mg  5 mg Oral QHS PRN,MR X 1 Lanis Pupa, MD   5 mg at 02/12/23 0026     Discharge Medications: Please see discharge summary for a list of discharge medications.  Relevant Imaging Results:  Relevant Lab Results:   Additional Information SS#: [id number]  John Cook, [place]

## 2023-02-14 NOTE — Evaluation (Signed)
 Occupational Therapy Re-Evaluation Patient Details Name: John Cook MRN: [id number] DOB: 1970-12-03 Today's Date: 02/14/2023   History of Present Illness 53 yo male with worsening back pain with movement over the past few weeks. Transferred from [facility] after CT shows cage migration in the disc space since his L4-L5 PLIF 12/29/22. 02/13/23 removal of pedical screws and rods and placement of Posterior non-segmental instrumentation, L4-5.  PMH Lumbar fusion L4/5 on 12/29/23.with multiple falls postop, asthma, DM, GERD, HLD, HTN, obesity.   Clinical Impression   PTA pt living at home alone since his last surgery and had 2 falls: he tripped over rebar, then caught his leg getting out of tub and had a fall in the bathroom. Mobility limited this session due to fatigue and pain, however pt overall Mod A with LB ADL due to below listed deficits. Patient will benefit from continued inpatient follow up therapy, <3 hours/day to maximize functional level of independence to facilitate safe DC home @ modified independent level. Acute OT will follow and begin education on use of AE to increase independence with ADL and reduce risk of falls.       If plan is discharge home, recommend the following: A little help with walking and/or transfers;A lot of help with bathing/dressing/bathroom;Assist for transportation    Functional Status Assessment  Patient has had a recent decline in their functional status and demonstrates the ability to make significant improvements in function in a reasonable and predictable amount of time.  Equipment Recommendations  None recommended by OT    Recommendations for Other Services       Precautions / Restrictions Precautions Precautions: Back;Fall Precaution Comments: verbally reviewed, watch BP, new drain with wound vac. Pt not interested in back precaution handout - states he remembers it from prior surgery Required Braces or Orthoses: Spinal Brace Spinal Brace:  Lumbar corset Restrictions Weight Bearing Restrictions Per Provider Order: No      Mobility Bed Mobility Overal bed mobility: Needs Assistance Bed Mobility: Supine to Sit, Sit to Supine           General bed mobility comments: able to roll in bed; states he is able to sit up independently; declining further mobility secondary to being tired from BP dropping earlier    Transfers                   General transfer comment: deferred due to fatigue      Balance Overall balance assessment: Needs assistance Sitting-balance support: Feet supported, No upper extremity supported Sitting balance-Leahy Scale: Good     Standing balance support: Bilateral upper extremity supported, During functional activity, Reliant on assistive device for balance Standing balance-Leahy Scale: Poor - reliant on RW per report; able to transfer onto [facility] with staff                             ADL either performed or assessed with clinical judgement   ADL Overall ADL's : Needs assistance/impaired Eating/Feeding: Independent   Grooming: Set up;Sitting   Upper Body Bathing: Set up;Sitting   Lower Body Bathing: Moderate assistance;Sit to/from stand   Upper Body Dressing : Set up;Sitting Upper Body Dressing Details (indicate cue type and reason): aable to donn Lumvar corsett Lower Body Dressing: Moderate assistance;Sit to/from stand Lower Body Dressing Details (indicate cue type and reason): unable to complete figure 4, usually props his legs up due to body habitus Toilet Transfer: Contact guard assist;Ambulation  Toileting- Clothing Manipulation and Hygiene: Moderate assistance; has wide 3in1 in room; would benefit from toilet aid Toileting - Clothing Manipulation Details (indicate cue type and reason): difficulty with pericare due to body habitus     Functional mobility during ADLs: Minimal assistance;Rolling walker (2 wheels)       Vision Baseline Vision/History: 0 No  visual deficits Ability to See in Adequate Light: 0 Adequate       Perception         Praxis         Pertinent Vitals/Pain Pain Assessment Pain Assessment: Faces Faces Pain Scale: Hurts whole lot Pain Location: back Pain Descriptors / Indicators: Discomfort, Guarding, Grimacing Pain Intervention(s): Limited activity within patient's tolerance, Patient requesting pain meds-RN notified     Extremity/Trunk Assessment Upper Extremity Assessment Upper Extremity Assessment: Overall WFL for tasks assessed (occasional numbness inmiddle andring when driving)   Lower Extremity Assessment Lower Extremity Assessment: Defer to PT evaluation (RLE weaker than L; sensation abnormality) RLE Deficits / Details: AROM/PROM limited by pain RLE: Unable to fully assess due to pain RLE Sensation: WNL LLE Deficits / Details: AROM/PROM limited by pain LLE: Unable to fully assess due to pain LLE Sensation: WNL   Cervical / Trunk Assessment Cervical / Trunk Assessment: Back Surgery   Communication Communication Communication: No apparent difficulties   Cognition Arousal: Alert Behavior During Therapy: WFL for tasks assessed/performed Overall Cognitive Status: Within Functional Limits for tasks assessed                                       General Comments  pt with c/o dizziness on coming to [facility] by the time BP was 84/61, and dizziness was better,    Exercises     Shoulder Instructions      Home Living Family/patient expects to be discharged to:: Skilled nursing facility Living Arrangements: Alone Available Help at Discharge: Other (Comment) (relies on neighbors) Type of Home: Mobile home Home Access: Ramped entrance;Other (comment)     Home Layout: One level     Bathroom Shower/Tub: Chief Strategy Officer: Standard     Home Equipment: BSC/3in1          Prior Functioning/Environment Prior Level of Function : Independent/Modified  Independent;Driving;History of Falls (last six months)             Mobility Comments: two falls in last two months ADLs Comments: prior to previous back surgery was independent and driving, now is requiring help from niegbors and is barely leaving the house        OT Problem List: Decreased strength;Decreased range of motion;Decreased activity tolerance;Impaired balance (sitting and/or standing);Decreased safety awareness;Decreased knowledge of use of DME or AE;Decreased knowledge of precautions;Obesity;Pain      OT Treatment/Interventions: Self-care/ADL training;Therapeutic exercise;DME and/or AE instruction;Therapeutic activities;Patient/family education;Balance training    OT Goals(Current goals can be found in the care plan section) Acute Rehab OT Goals Patient Stated Goal: to go to rehab and get stronger OT Goal Formulation: With patient Time For Goal Achievement: 02/24/23 Potential to Achieve Goals: Good  OT Frequency: Min 1X/week    Co-evaluation              AM-PAC OT 6 Clicks Daily Activity     Outcome Measure Help from another person eating meals?: None Help from another person taking care of personal grooming?: A Little Help from another person toileting, which  includes using toliet, bedpan, or urinal?: A Lot Help from another person bathing (including washing, rinsing, drying)?: A Lot Help from another person to put on and taking off regular upper body clothing?: A Little Help from another person to put on and taking off regular lower body clothing?: A Lot 6 Click Score: 16   End of Session Nurse Communication: Patient requests pain meds  Activity Tolerance: Patient limited by fatigue;Patient limited by pain Patient left: in bed;with call bell/phone within reach  OT Visit Diagnosis: Unsteadiness on feet (R26.81);Muscle weakness (generalized) (M62.81);Repeated falls (R29.6);Pain Pain - part of body:  (back)                Time: 8594-8578 OT Time  Calculation (min): 16 min Charges:  OT General Charges $OT Visit: 1 Visit OT Evaluation $OT Re-eval: 1 Re-eval  Kreg Sink, OT/L   Acute OT Clinical Specialist Acute Rehabilitation Services Pager [phone] Office [phone]   [facility] 02/14/2023, 2:38 PM

## 2023-02-14 NOTE — Progress Notes (Signed)
  NEUROSURGERY PROGRESS NOTE   No issues overnight. Cont to c/o back pain.  EXAM:  BP (!) 99/50 (BP Location: Left Wrist)   Pulse 83   Temp 98.7 F (37.1 C) (Oral)   Resp 18   Ht 5' 5 (1.651 m)   Wt (!) 158 kg   SpO2 94%   BMI 57.96 kg/m   Awake, alert, oriented  Speech fluent, appropriate  CN grossly intact  5/5 BUE/BLE  Wound Prevena in place, maintaining suction.  IMPRESSION:  53 y.o. male POD#1 hardware revision for loose screws after PLIF 1 month ago  PLAN: - Cont to mobilize with PT/OT - Suspect patient will need SNF placement upon d/c  Gerldine Maizes, MD [facility]

## 2023-02-15 LAB — GLUCOSE, CAPILLARY
Glucose-Capillary: 105 mg/dL — ABNORMAL HIGH (ref 70–99)
Glucose-Capillary: 111 mg/dL — ABNORMAL HIGH (ref 70–99)
Glucose-Capillary: 113 mg/dL — ABNORMAL HIGH (ref 70–99)
Glucose-Capillary: 114 mg/dL — ABNORMAL HIGH (ref 70–99)
Glucose-Capillary: 118 mg/dL — ABNORMAL HIGH (ref 70–99)
Glucose-Capillary: 119 mg/dL — ABNORMAL HIGH (ref 70–99)

## 2023-02-15 MED ORDER — VANCOMYCIN HCL 2000 MG/400ML IV SOLN
2000.0000 mg | INTRAVENOUS | Status: DC
  Administered 2023-02-16 – 2023-02-18 (×3): 2000 mg via INTRAVENOUS
  Filled 2023-02-15 (×4): qty 400

## 2023-02-15 MED ORDER — SODIUM CHLORIDE 0.9 % IV BOLUS
500.0000 mL | Freq: Once | INTRAVENOUS | Status: DC
Start: 2023-02-15 — End: 2023-02-15

## 2023-02-15 MED ORDER — VANCOMYCIN HCL 10 G IV SOLR
2500.0000 mg | Freq: Once | INTRAVENOUS | Status: AC
  Administered 2023-02-15: 2500 mg via INTRAVENOUS
  Filled 2023-02-15: qty 2500

## 2023-02-15 MED ORDER — SODIUM CHLORIDE 0.9 % IV BOLUS
500.0000 mL | Freq: Once | INTRAVENOUS | Status: AC
  Administered 2023-02-15: 500 mL via INTRAVENOUS

## 2023-02-15 NOTE — Plan of Care (Signed)
  Problem: Education: Goal: Knowledge of General Education information will improve Description: Including pain rating scale, medication(s)/side effects and non-pharmacologic comfort measures Outcome: Progressing   Problem: Clinical Measurements: Goal: Will remain free from infection Outcome: Progressing   Problem: Clinical Measurements: Goal: Diagnostic test results will improve Outcome: Progressing   Problem: Clinical Measurements: Goal: Cardiovascular complication will be avoided Outcome: Progressing   Problem: Pain Managment: Goal: General experience of comfort will improve and/or be controlled Outcome: Progressing

## 2023-02-15 NOTE — Progress Notes (Signed)
 Pharmacy Antibiotic Note  John Cook is a 53 y.o. male admitted on 02/08/2023 with  failed lumbar hardware .  Had revision surgery on 02/13/23 and cultures obtained.  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  Pharmacy has been consulted for Vancomycin  dosing.  Plan: Will  start vancomycin  2.5g IV x 1, then 2g IV q24h Will monitor renal function and levels BMET in AM  Height: 5' 5 (165.1 cm) Weight: (!) 158 kg (348 lb 5.2 oz) IBW/kg (Calculated) : 61.5  Temp (24hrs), Avg:98.2 F (36.8 C), Min:98 F (36.7 C), Max:98.4 F (36.9 C)  Recent Labs  Lab 02/09/23 0147  WBC 10.7*  CREATININE 1.57*    Estimated Creatinine Clearance: 77 mL/min (A) (by C-G formula based on SCr of 1.57 mg/dL (H)).    Allergies  Allergen Reactions   Lisinopril Cough    Antimicrobials this admission: 2/3 vancomycin  1g x 1, cefazolin  3g x 1 2/5 vancomycin   >>   Dose adjustments this admission: NA  Microbiology results: 2/3 wound> staph simulans oxacillin resistant but otherwise sensitive 2/3 MRSA PCR: negative  Thank you for allowing pharmacy to be a part of this patient's care.  John Cook 02/15/2023 2:57 PM

## 2023-02-15 NOTE — Progress Notes (Signed)
 Postop day 2.  Patient complains of back pain but no radicular pain.  Mobilizing slowly.  Awake and alert.  Oriented and appropriate.  Motor and sensory function intact.  Wound clean and dry with surface VAC in place.  Afebrile.  Vital signs are stable.  Continue efforts at pain control and mobilization.  Working towards skilled nursing facility placement.

## 2023-02-15 NOTE — Progress Notes (Signed)
 Physical Therapy Treatment Patient Details Name: John Cook MRN: [id number] DOB: Nov 19, 1970 Today's Date: 02/15/2023   History of Present Illness 53 yo male with worsening back pain with movement over the past few weeks. Transferred from [facility] after CT shows cage migration in the disc space since his L4-L5 PLIF 12/29/22. 02/13/23 removal of pedical screws and rods and placement of Posterior non-segmental instrumentation, L4-5.  PMH Lumbar fusion L4/5 on 12/29/23.with multiple falls postop, asthma, DM, GERD, HLD, HTN, obesity.    PT Comments  Pt resting in bed on arrival and agreeable to session with continues progress towards acute goals. Pt able to progress gait this session with RW for support and min A to steady and manage RW. Pt requiring light min A to elevate trunk from side-lying>sit and min A to boost to stand from low EOB. Pt able to recall 2/3 back precautions during session and was educated on remaining precaution, brace application/wearing schedule, appropriate activity progression, and importance of continues mobility with pt verbalizing understanding. Pt with improved adherence to precautions this session during bed mobility. Current plan remains appropriate to address deficits and maximize functional independence and safety. Pt continues to benefit from skilled PT services to progress toward functional mobility goals.       If plan is discharge home, recommend the following: A little help with walking and/or transfers;Two people to help with bathing/dressing/bathroom;Assistance with cooking/housework;Direct supervision/assist for medications management;Direct supervision/assist for financial management;Assist for transportation;Help with stairs or ramp for entrance   Can travel by private vehicle     No  Equipment Recommendations  None recommended by PT    Recommendations for Other Services       Precautions / Restrictions Precautions Precautions: Back;Fall Precaution  Booklet Issued: Yes (comment) Precaution Comments: verbally reviewed, watch BP, new drain with wound vac. Pt not interested in back precaution handout - states he remembers it from prior surgery Required Braces or Orthoses: Spinal Brace Spinal Brace: Lumbar corset Restrictions Weight Bearing Restrictions Per Provider Order: No     Mobility  Bed Mobility Overal bed mobility: Needs Assistance Bed Mobility: Rolling, Sidelying to Sit, Sit to Sidelying Rolling: Contact guard assist Sidelying to sit: Min assist     Sit to sidelying: Min assist General bed mobility comments: min A to elevate trunk to sidelying and to return LEs to bed at end of session    Transfers Overall transfer level: Needs assistance Equipment used: Rolling walker (2 wheels) Transfers: Sit to/from Stand, Bed to chair/wheelchair/BSC Sit to Stand: Min assist           General transfer comment: light min A to facilitate anterior weight shift and to boost to stand    Ambulation/Gait Ambulation/Gait assistance: Min assist Gait Distance (Feet): 45 Feet Assistive device: Rolling walker (2 wheels) Gait Pattern/deviations: Step-through pattern, Trunk flexed, Antalgic Gait velocity: slowed     General Gait Details: slow guarded gait with min A to manage RW, cues for close RW proximity and upright trunk   Stairs             Wheelchair Mobility     Tilt Bed    Modified Rankin (Stroke Patients Only)       Balance Overall balance assessment: Needs assistance Sitting-balance support: Feet supported, No upper extremity supported Sitting balance-Leahy Scale: Good     Standing balance support: Bilateral upper extremity supported, During functional activity, Reliant on assistive device for balance Standing balance-Leahy Scale: Poor Standing balance comment: reliant on UE support for  dynamic tasks                            Cognition Arousal: Alert Behavior During Therapy: WFL for tasks  assessed/performed Overall Cognitive Status: Within Functional Limits for tasks assessed                                          Exercises      General Comments General comments (skin integrity, edema, etc.): BP stable with positional changes, mild c/o lightheadedness at end of gait bout      Pertinent Vitals/Pain Pain Assessment Pain Assessment: 0-10 Pain Score: 7  Pain Location: back Pain Descriptors / Indicators: Discomfort, Guarding, Grimacing Pain Intervention(s): Monitored during session, Limited activity within patient's tolerance    Home Living                          Prior Function            PT Goals (current goals can now be found in the care plan section) Acute Rehab PT Goals Patient Stated Goal: return home, independent PT Goal Formulation: With patient Time For Goal Achievement: 02/24/23 Progress towards PT goals: Progressing toward goals    Frequency    Min 1X/week      PT Plan      Co-evaluation              AM-PAC PT 6 Clicks Mobility   Outcome Measure  Help needed turning from your back to your side while in a flat bed without using bedrails?: A Little Help needed moving from lying on your back to sitting on the side of a flat bed without using bedrails?: A Little Help needed moving to and from a bed to a chair (including a wheelchair)?: A Little Help needed standing up from a chair using your arms (e.g., wheelchair or bedside chair)?: A Little Help needed to walk in hospital room?: A Little Help needed climbing 3-5 steps with a railing? : Total 6 Click Score: 16    End of Session Equipment Utilized During Treatment: Back brace Activity Tolerance: Patient tolerated treatment well Patient left: in bed;with call bell/phone within reach;with bed alarm set Nurse Communication: Mobility status PT Visit Diagnosis: Other abnormalities of gait and mobility (R26.89);Pain Pain - part of body:  (back and LE)      Time: 8451-8394 PT Time Calculation (min) (ACUTE ONLY): 17 min  Charges:    $Gait Training: 8-22 mins PT General Charges $$ ACUTE PT VISIT: 1 Visit                     Payson Evrard R. PTA Acute Rehabilitation Services Office: [phone]   Therisa CHRISTELLA Boor 02/15/2023, 4:34 PM

## 2023-02-15 NOTE — Plan of Care (Signed)
 Pt alert x4, cc diet, room air, x2 assist, plan rehab

## 2023-02-15 NOTE — TOC Progression Note (Signed)
 Transition of Care [facility]) - Progression Note    Patient Details  Name: John Cook MRN: [id number] Date of Birth: [birth year]  [facility]) CM/SW Contact  Almarie CHRISTELLA Goodie, [place] Phone Number: 02/15/2023, 10:08 AM  Clinical Narrative:   CSW noting continued recommendation for SNF after surgery. Patient from home alone, in agreement with SNF placement. CSW completed referral and faxed out, awaiting bed offers. CSW to follow.    Expected Discharge Plan: Skilled Nursing Facility Barriers to Discharge: Continued Medical Work up, English As A Second Language Teacher, Inadequate or no insurance, SNF Pending bed offer  Expected Discharge Plan and Services   Discharge Planning Services: CM Consult   Living arrangements for the past 2 months: Mobile Home                                       Social Determinants of Health (SDOH) Interventions SDOH Screenings   Food Insecurity: Food Insecurity Present (02/09/2023)  Housing: High Risk (02/09/2023)  Transportation Needs: Unmet Transportation Needs (02/09/2023)  Utilities: Not At Risk (02/09/2023)  Tobacco Use: Medium Risk (02/13/2023)    Readmission Risk Interventions     No data to display

## 2023-02-16 LAB — BASIC METABOLIC PANEL
Anion gap: 9 (ref 5–15)
BUN: 21 mg/dL — ABNORMAL HIGH (ref 6–20)
CO2: 28 mmol/L (ref 22–32)
Calcium: 9.5 mg/dL (ref 8.9–10.3)
Chloride: 94 mmol/L — ABNORMAL LOW (ref 98–111)
Creatinine, Ser: 1.23 mg/dL (ref 0.61–1.24)
GFR, Estimated: >60 mL/min (ref >60)
Glucose, Bld: 103 mg/dL — ABNORMAL HIGH (ref 70–99)
Potassium: 4.1 mmol/L (ref 3.5–5.1)
Sodium: 131 mmol/L — ABNORMAL LOW (ref 135–145)

## 2023-02-16 LAB — GLUCOSE, CAPILLARY
Glucose-Capillary: 107 mg/dL — ABNORMAL HIGH (ref 70–99)
Glucose-Capillary: 108 mg/dL — ABNORMAL HIGH (ref 70–99)
Glucose-Capillary: 116 mg/dL — ABNORMAL HIGH (ref 70–99)
Glucose-Capillary: 118 mg/dL — ABNORMAL HIGH (ref 70–99)
Glucose-Capillary: 137 mg/dL — ABNORMAL HIGH (ref 70–99)
Glucose-Capillary: 97 mg/dL (ref 70–99)
Glucose-Capillary: 99 mg/dL (ref 70–99)

## 2023-02-16 NOTE — TOC Progression Note (Addendum)
 Transition of Care [facility]) - Progression Note    Patient Details  Name: John Cook MRN: [id number] Date of Birth: 04-11-1970  [facility]) CM/SW Contact  Almarie CHRISTELLA [place]ie, [place] Phone Number: 02/16/2023, 10:17 AM  Clinical Narrative:   CSW contacted manager with [facility], to discuss lack of options for SNF and being told that the patient does not have coverage/payment for SNF placement. Angelina to staff the case with a case manager that can try to assist with locating a SNF for the patient. Reference number is [id number]. Awaiting call from case manager.  UPDATE: CSW received call back from case manager Calley, that she will pull and send CSW a list of in-network SNF options. CSW received information in an email. CSW to follow.    Expected Discharge Plan: Skilled Nursing Facility Barriers to Discharge: Continued Medical Work up, English As A Second Language Teacher, Inadequate or no insurance, SNF Pending bed offer  Expected Discharge Plan and Services   Discharge Planning Services: CM Consult   Living arrangements for the past 2 months: Mobile Home                                       Social Determinants of Health (SDOH) Interventions SDOH Screenings   Food Insecurity: Food Insecurity Present (02/09/2023)  Housing: High Risk (02/09/2023)  Transportation Needs: Unmet Transportation Needs (02/09/2023)  Utilities: Not At Risk (02/09/2023)  Tobacco Use: Medium Risk (02/13/2023)    Readmission Risk Interventions     No data to display

## 2023-02-16 NOTE — Progress Notes (Signed)
 Occupational Therapy Treatment Patient Details Name: John Cook MRN: [id number] DOB: [birth year] Today's Date: 02/16/2023   History of present illness 53 yo male with worsening back pain with movement over the past few weeks. Transferred from [facility] after CT shows cage migration in the disc space since his L4-L5 PLIF 12/29/22. 02/13/23 removal of pedical screws and rods and placement of Posterior non-segmental instrumentation, L4-5.  PMH Lumbar fusion L4/5 on 12/29/23.with multiple falls postop, asthma, DM, GERD, HLD, HTN, obesity.   OT comments  Excellent participation today with focus of session on increasing independence with ADL tasks with use of AE while following back precautions as noted below. Ambulated @ 60 ft @ RW level with 2/4 DOE however VSS on RA. Patient will benefit from continued inpatient follow up therapy, <3 hours/day. Acute OT to follow.       If plan is discharge home, recommend the following:  A little help with walking and/or transfers;A lot of help with bathing/dressing/bathroom;Assist for transportation   Equipment Recommendations  None recommended by OT    Recommendations for Other Services      Precautions / Restrictions Precautions Precautions: Back;Fall Precaution Booklet Issued: Yes (comment) Precaution Comments: verbally reviewed, watch BP, new drain with wound vac. Pt not interested in back precaution handout - states he remembers it from prior surgery Required Braces or Orthoses: Spinal Brace Spinal Brace: Lumbar corset Restrictions Weight Bearing Restrictions Per Provider Order: No       Mobility Bed Mobility Overal bed mobility: Needs Assistance Bed Mobility: Rolling, Sidelying to Sit, Sit to Sidelying Rolling: Contact guard assist Sidelying to sit: Contact guard assist            Transfers Overall transfer level: Needs assistance Equipment used: Rolling walker (2 wheels) Transfers: Sit to/from Stand, Bed to  chair/wheelchair/BSC Sit to Stand: Contact guard assist, From elevated surface                 Balance Overall balance assessment: Needs assistance Sitting-balance support: Feet supported, No upper extremity supported Sitting balance-Leahy Scale: Good     Standing balance support: Bilateral upper extremity supported, During functional activity, Reliant on assistive device for balance Standing balance-Leahy Scale: Fair Standing balance comment: able to release RW for pericare                           ADL either performed or assessed with clinical judgement   ADL Overall ADL's : Needs assistance/impaired Eating/Feeding: Independent   Grooming: Set up;Sitting   Upper Body Bathing: Set up;Sitting   Lower Body Bathing: Moderate assistance;Sit to/from stand;With adaptive equipment   Upper Body Dressing : Set up;Sitting Upper Body Dressing Details (indicate cue type and reason): aable to donn Lumvar corsett Lower Body Dressing: Moderate assistance;Sit to/from stand;With adaptive equipment;Cueing for back precautions   Toilet Transfer: Contact guard assist;Ambulation   Toileting- Clothing Manipulation and Hygiene: Moderate assistance;With adaptive equipment       Functional mobility during ADLs: Rolling walker (2 wheels);Contact guard assist General ADL Comments: Issued AE for toileting and LB ADL tasks; Began education on use and incorporated into session    Extremity/Trunk Assessment Upper Extremity Assessment Upper Extremity Assessment: Overall WFL for tasks assessed            Vision   Vision Assessment?: Wears glasses for driving   Perception     Praxis      Cognition Arousal: Alert Behavior During Therapy: [facility] for tasks assessed/performed  Overall Cognitive Status: Within Functional Limits for tasks assessed                                          Exercises      Shoulder Instructions       General Comments wound vac  not suctionoing; tegaderm replced and improved suction - nsg made aware    Pertinent Vitals/ Pain       Pain Assessment Pain Assessment: 0-10 Pain Score: 6  Pain Location: back Pain Descriptors / Indicators: Discomfort, Guarding, Grimacing Pain Intervention(s): Limited activity within patient's tolerance, Premedicated before session  Home Living                                          Prior Functioning/Environment              Frequency  Min 1X/week        Progress Toward Goals  OT Goals(current goals can now be found in the care plan section)  Progress towards OT goals: Progressing toward goals  Acute Rehab OT Goals Patient Stated Goal: go to rehab and get stronger OT Goal Formulation: With patient Time For Goal Achievement: 02/24/23 Potential to Achieve Goals: Good ADL Goals Pt Will Perform Lower Body Bathing: with set-up;with supervision;sit to/from stand;with adaptive equipment Pt Will Perform Lower Body Dressing: with set-up;with supervision;sit to/from stand;with adaptive equipment Pt Will Transfer to Toilet: ambulating;with supervision;bedside commode Pt Will Perform Toileting - Clothing Manipulation and hygiene: with set-up;with supervision;sitting/lateral leans;sit to/from stand;with adaptive equipment Additional ADL Goal #1: Patient will be able to complete functional task in standing for 3-5 minutes prior to needing seated rest break in order to improve overall activity tolerance.  Plan      Co-evaluation                 AM-PAC OT 6 Clicks Daily Activity     Outcome Measure   Help from another person eating meals?: None Help from another person taking care of personal grooming?: A Little Help from another person toileting, which includes using toliet, bedpan, or urinal?: A Lot Help from another person bathing (including washing, rinsing, drying)?: A Lot Help from another person to put on and taking off regular upper body  clothing?: A Little Help from another person to put on and taking off regular lower body clothing?: A Lot 6 Click Score: 16    End of Session Equipment Utilized During Treatment: Rolling walker (2 wheels)  OT Visit Diagnosis: Unsteadiness on feet (R26.81);Muscle weakness (generalized) (M62.81);Repeated falls (R29.6);Pain Pain - part of body:  (back)   Activity Tolerance Patient tolerated treatment well   Patient Left with call bell/phone within reach;in chair   Nurse Communication Mobility status        Time: 8755-8668 OT Time Calculation (min): 47 min  Charges: OT General Charges $OT Visit: 1 Visit OT Treatments $Cook Care/Home Management : 38-52 mins  Kreg Sink, OT/L   Acute OT Clinical Specialist Acute Rehabilitation Services Pager [phone] Office [phone]   [facility] 02/16/2023, 5:34 PM

## 2023-02-16 NOTE — Progress Notes (Signed)
 Postop day 3.  Patient looks better today.  Back pain better controlled.  Was able to mobilize some yesterday.  Minimal lower extremity symptoms.  Afebrile.  Vital signs are stable.  Urine output good.  Awake and alert.  Oriented and appropriate.  Motor and sensory function intact.  Wound dressing dry with VAC in place.  Intraoperative cultures are growing rare Staph simulans which is likely a contaminant however while he is in the hospital we will cover with vancomycin  and plan for a 2-week course of doxycycline  once discharged to skilled nursing facility.  Overall I have a very low suspicion of wound infection as there are no clinical or intraoperative findings consistent with this.

## 2023-02-16 NOTE — Plan of Care (Signed)
  Problem: Education: Goal: Knowledge of General Education information will improve Description: Including pain rating scale, medication(s)/side effects and non-pharmacologic comfort measures Outcome: Progressing   Problem: Health Behavior/Discharge Planning: Goal: Ability to manage health-related needs will improve Outcome: Progressing   Problem: Activity: Goal: Risk for activity intolerance will decrease Outcome: Progressing   Problem: Nutrition: Goal: Adequate nutrition will be maintained Outcome: Progressing   Problem: Coping: Goal: Level of anxiety will decrease Outcome: Progressing   Problem: Elimination: Goal: Will not experience complications related to bowel motility Outcome: Progressing Goal: Will not experience complications related to urinary retention Outcome: Progressing   Problem: Pain Managment: Goal: General experience of comfort will improve and/or be controlled Outcome: Progressing   Problem: Safety: Goal: Ability to remain free from injury will improve Outcome: Progressing

## 2023-02-17 LAB — GLUCOSE, CAPILLARY
Glucose-Capillary: 108 mg/dL — ABNORMAL HIGH (ref 70–99)
Glucose-Capillary: 113 mg/dL — ABNORMAL HIGH (ref 70–99)
Glucose-Capillary: 92 mg/dL (ref 70–99)
Glucose-Capillary: 125 mg/dL — ABNORMAL HIGH (ref 70–99)
Glucose-Capillary: 108 mg/dL — ABNORMAL HIGH (ref 70–99)

## 2023-02-17 NOTE — Progress Notes (Signed)
 Physical Therapy Treatment Patient Details Name: John Cook MRN: [id number] DOB: 18-Feb-1970 Today's Date: 02/17/2023   History of Present Illness 53 yo male with worsening back pain with movement over the past few weeks. Transferred from [facility] after CT shows cage migration in the disc space since his L4-L5 PLIF 12/29/22. 02/13/23 removal of pedical screws and rods and placement of Posterior non-segmental instrumentation, L4-5.  PMH Lumbar fusion L4/5 on 12/29/23.with multiple falls postop, asthma, DM, GERD, HLD, HTN, obesity.    PT Comments  Pt resting in bed on arrival and agreeable to session with steady progress towards acute goals. Pt continues to be limited by pain, decreased activity tolerance and global weakness. Pt requiring grossly min A - CGA for bed mobility, transfers and gait with RW for support. Pt continues to benefit from cues for spinal precaution adherence as pt with tendency for twisting when returning to sidelying at end of session. Current plan remains appropriate to address deficits and maximize functional independence and decrease caregiver burden. Pt continues to benefit from skilled PT services to progress toward functional mobility goals.     If plan is discharge home, recommend the following: A little help with walking and/or transfers;Two people to help with bathing/dressing/bathroom;Assistance with cooking/housework;Direct supervision/assist for medications management;Direct supervision/assist for financial management;Assist for transportation;Help with stairs or ramp for entrance   Can travel by private vehicle     No  Equipment Recommendations  None recommended by PT    Recommendations for Other Services       Precautions / Restrictions Precautions Precautions: Back;Fall Precaution Booklet Issued: Yes (comment) Precaution Comments: verbally reviewed, watch BP Required Braces or Orthoses: Spinal Brace Spinal Brace: Lumbar corset Restrictions Weight  Bearing Restrictions Per Provider Order: No     Mobility  Bed Mobility Overal bed mobility: Needs Assistance Bed Mobility: Rolling, Sidelying to Sit, Sit to Sidelying Rolling: Contact guard assist Sidelying to sit: Contact guard assist     Sit to sidelying: Min assist General bed mobility comments: min A to return LEs to bed at end of session    Transfers Overall transfer level: Needs assistance Equipment used: Rolling walker (2 wheels) Transfers: Sit to/from Stand, Bed to chair/wheelchair/BSC Sit to Stand: Contact guard assist, From elevated surface, Min assist   Step pivot transfers: Contact guard assist       General transfer comment: light min A to facilitate anterior weight shift and to boost to stand from loe height surface    Ambulation/Gait Ambulation/Gait assistance: Contact guard assist Gait Distance (Feet): 70 Feet Assistive device: Rolling walker (2 wheels) Gait Pattern/deviations: Step-through pattern, Trunk flexed, Antalgic Gait velocity: slowed     General Gait Details: slow guarded gait with min A to manage RW, cues for closer RW proximity and upright trunk   Stairs             Wheelchair Mobility     Tilt Bed    Modified Rankin (Stroke Patients Only)       Balance Overall balance assessment: Needs assistance Sitting-balance support: Feet supported, No upper extremity supported Sitting balance-Leahy Scale: Good     Standing balance support: Bilateral upper extremity supported, During functional activity, Reliant on assistive device for balance Standing balance-Leahy Scale: Fair                              Cognition Arousal: Alert Behavior During Therapy: WFL for tasks assessed/performed Overall Cognitive Status: Within Functional  Limits for tasks assessed                                          Exercises      General Comments        Pertinent Vitals/Pain Pain Assessment Pain Assessment:  Faces Faces Pain Scale: Hurts little more Pain Location: back Pain Descriptors / Indicators: Discomfort, Guarding, Grimacing Pain Intervention(s): Premedicated before session, Monitored during session, Limited activity within patient's tolerance    Home Living                          Prior Function            PT Goals (current goals can now be found in the care plan section) Acute Rehab PT Goals Patient Stated Goal: return home, independent PT Goal Formulation: With patient Time For Goal Achievement: 02/24/23 Progress towards PT goals: Progressing toward goals    Frequency    Min 1X/week      PT Plan      Co-evaluation              AM-PAC PT 6 Clicks Mobility   Outcome Measure  Help needed turning from your back to your side while in a flat bed without using bedrails?: A Little Help needed moving from lying on your back to sitting on the side of a flat bed without using bedrails?: A Little Help needed moving to and from a bed to a chair (including a wheelchair)?: A Little Help needed standing up from a chair using your arms (e.g., wheelchair or bedside chair)?: A Little Help needed to walk in hospital room?: A Little Help needed climbing 3-5 steps with a railing? : Total 6 Click Score: 16    End of Session Equipment Utilized During Treatment: Back brace Activity Tolerance: Patient tolerated treatment well Patient left: in bed;with call bell/phone within reach Nurse Communication: Mobility status PT Visit Diagnosis: Other abnormalities of gait and mobility (R26.89);Pain Pain - part of body:  (back and LE)     Time: 8460-8442 PT Time Calculation (min) (ACUTE ONLY): 18 min  Charges:    $Gait Training: 8-22 mins PT General Charges $$ ACUTE PT VISIT: 1 Visit                     Yen Wandell R. PTA Acute Rehabilitation Services Office: [phone]   Therisa CHRISTELLA Boor 02/17/2023, 4:07 PM

## 2023-02-17 NOTE — TOC Progression Note (Signed)
 Transition of Care [facility]) - Progression Note    Patient Details  Name: John Cook MRN: [id number] Date of Birth: 17-Dec-1970  [facility] CM/SW Contact  Almarie CHRISTELLA [place], [place] Phone Number: 02/17/2023, 1:49 PM  Clinical Narrative:   Patient continues with no bed offers. CSW faxed out referral further, awaiting responses.    Expected Discharge Plan: Skilled Nursing Facility Barriers to Discharge: Continued Medical Work up, English As A Second Language Teacher, Inadequate or no insurance, SNF Pending bed offer  Expected Discharge Plan and Services   Discharge Planning Services: CM Consult   Living arrangements for the past 2 months: Mobile Home                                       Social Determinants of Health (SDOH) Interventions SDOH Screenings   Food Insecurity: Food Insecurity Present (02/09/2023)  Housing: High Risk (02/09/2023)  Transportation Needs: Unmet Transportation Needs (02/09/2023)  Utilities: Not At Risk (02/09/2023)  Tobacco Use: Medium Risk (02/13/2023)    Readmission Risk Interventions     No data to display

## 2023-02-17 NOTE — Plan of Care (Signed)

## 2023-02-17 NOTE — Plan of Care (Signed)
  Problem: Clinical Measurements: Goal: Will remain free from infection Outcome: Progressing Goal: Diagnostic test results will improve Outcome: Progressing   Problem: Clinical Measurements: Goal: Diagnostic test results will improve Outcome: Progressing   Problem: Coping: Goal: Level of anxiety will decrease Outcome: Progressing   Problem: Pain Managment: Goal: General experience of comfort will improve and/or be controlled Outcome: Progressing

## 2023-02-17 NOTE — Progress Notes (Signed)
   Providing Compassionate, Quality Care - Together   Subjective: Patient reports he is tired. Nursing staff report wound vac not holding its seal.  Objective: Vital signs in last 24 hours: Temp:  [97.9 F (36.6 C)-98.3 F (36.8 C)] 97.9 F (36.6 C) (02/07 0844) Pulse Rate:  [65-82] 65 (02/07 0844) Resp:  [18-20] 20 (02/07 0844) BP: (86-119)/(40-92) 86/71 (02/07 0844) SpO2:  [92 %-100 %] 97 % (02/07 0844)  Intake/Output from previous day: 02/06 0701 - 02/07 0700 In: 640 [P.O.:240; IV Piggyback:400] Out: 1400 [Urine:1400] Intake/Output this shift: Total I/O In: -  Out: 450 [Urine:450]  Alert and oriented PERRLA CN II-XII grossly intact MAE, Strength and sensation intact Removed wound vac. Wound is dry and intact. There are two blisters at the superior portion of the incision.   Lab Results: No results for input(s): WBC, HGB, HCT, PLT in the last 72 hours. BMET Recent Labs    02/16/23 0642  NA 131*  K 4.1  CL 94*  CO2 28  GLUCOSE 103*  BUN 21*  CREATININE 1.23  CALCIUM  9.5    Studies/Results: No results found.  Assessment/Plan: Patient underwent removal of hardware, exploration of fusion, followed by posterior non-segmental instrumentation at L4-5 by Dr. Lanis on 02/13/2023. Wound VAC removed today.   LOS: 9 days   -Awaiting SNF placement. -Encourage patient to mobilize.   Gerard Beck, DNP, AGNP-C Nurse Practitioner  [facility] [address], [place], [place] [address] P: [phone]    F: [phone]  02/17/2023, 11:09 AM

## 2023-02-17 NOTE — Progress Notes (Signed)
 Woke up confused this morning and inadvertently pulled out IV. New IV placed LFA. Also wound vac is not sealing after he said he woke up digging at his back where the dressing is. Unable to successfully get the dressing to seal. Will notify oncoming shift and wound care .

## 2023-02-18 LAB — GLUCOSE, CAPILLARY
Glucose-Capillary: 103 mg/dL — ABNORMAL HIGH (ref 70–99)
Glucose-Capillary: 105 mg/dL — ABNORMAL HIGH (ref 70–99)
Glucose-Capillary: 110 mg/dL — ABNORMAL HIGH (ref 70–99)
Glucose-Capillary: 138 mg/dL — ABNORMAL HIGH (ref 70–99)
Glucose-Capillary: 97 mg/dL (ref 70–99)

## 2023-02-18 LAB — VANCOMYCIN, PEAK: Vancomycin Pk: 50 ug/mL — ABNORMAL HIGH (ref 30–40)

## 2023-02-18 LAB — BASIC METABOLIC PANEL
Anion gap: 12 (ref 5–15)
BUN: 18 mg/dL (ref 6–20)
CO2: 24 mmol/L (ref 22–32)
Calcium: 9.5 mg/dL (ref 8.9–10.3)
Chloride: 95 mmol/L — ABNORMAL LOW (ref 98–111)
Creatinine, Ser: 1.11 mg/dL (ref 0.61–1.24)
GFR, Estimated: >60 mL/min (ref >60)
Glucose, Bld: 98 mg/dL (ref 70–99)
Potassium: 4 mmol/L (ref 3.5–5.1)
Sodium: 131 mmol/L — ABNORMAL LOW (ref 135–145)

## 2023-02-18 MED ORDER — CHLORHEXIDINE GLUCONATE CLOTH 2 % EX PADS
6.0000 | MEDICATED_PAD | Freq: Every day | CUTANEOUS | Status: DC
  Administered 2023-02-18 – 2023-02-24 (×7): 6 via TOPICAL

## 2023-02-18 NOTE — Plan of Care (Signed)
 Plan of care is reviewed. Pt is progressing. He is alert and fully oriented x 4, no distress overnight. Stable hemodynamically,afebrile, normal respiratory effort. Pain is well controlled with PRN Oxycodone . Mid lower back incision has scant drainage. Dressing changed BID/PRN. No major complaints. We will monitor.   Problem: Health Behavior/Discharge Planning: Goal: Ability to manage health-related needs will improve Outcome: Progressing   Problem: Clinical Measurements: Goal: Ability to maintain clinical measurements within normal limits will improve Outcome: Progressing Goal: Will remain free from infection Outcome: Progressing Goal: Diagnostic test results will improve Outcome: Progressing Goal: Respiratory complications will improve Outcome: Progressing Goal: Cardiovascular complication will be avoided Outcome: Progressing   Problem: Activity: Goal: Risk for activity intolerance will decrease Outcome: Progressing   Problem: Nutrition: Goal: Adequate nutrition will be maintained Outcome: Progressing   Problem: Coping: Goal: Level of anxiety will decrease Outcome: Progressing   Problem: Elimination: Goal: Will not experience complications related to bowel motility Outcome: Progressing Goal: Will not experience complications related to urinary retention Outcome: Progressing   Problem: Pain Managment: Goal: General experience of comfort will improve and/or be controlled Outcome: Progressing   Problem: Safety: Goal: Ability to remain free from injury will improve Outcome: Progressing   Problem: Skin Integrity: Goal: Risk for impaired skin integrity will decrease Outcome: Progressing   Problem: Education: Goal: Ability to describe self-care measures that may prevent or decrease complications (Diabetes Survival Skills Education) will improve Outcome: Progressing Goal: Individualized Educational Video(s) Outcome: Progressing   Problem: Nutritional: Goal: Maintenance  of adequate nutrition will improve Outcome: Progressing Goal: Progress toward achieving an optimal weight will improve Outcome: Progressing   Problem: Skin Integrity: Goal: Risk for impaired skin integrity will decrease Outcome: Progressing   Wendi Dash, RN

## 2023-02-18 NOTE — Progress Notes (Signed)
 Pharmacy Antibiotic Note  John Cook is a 53 y.o. male admitted on 02/08/2023 with  failed lumbar hardware .  Had revision surgery on 02/13/23 and cultures obtained.  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  Pharmacy has been consulted for Vancomycin  dosing.  Scr 1.11 down trending, stable Afebrile    Plan: Continue vancomycin  2g IV q24h Peak and trough ordered for 2/8 dose to assess current regimen Will continue monitor renal function  Follow up discharge plans  Height: 5' 5 (165.1 cm) Weight: (!) 158 kg (348 lb 5.2 oz) IBW/kg (Calculated) : 61.5  Temp (24hrs), Avg:98.3 F (36.8 C), Min:98 F (36.7 C), Max:98.5 F (36.9 C)  Recent Labs  Lab 02/16/23 0642 02/18/23 0559  CREATININE 1.23 1.11    Estimated Creatinine Clearance: 109 mL/min (by C-G formula based on SCr of 1.11 mg/dL).    Allergies  Allergen Reactions   Lisinopril Cough    Antimicrobials this admission: 2/3 vancomycin  1g x 1, cefazolin  3g x 1 2/5 vancomycin   >>   Dose adjustments this admission: NA  Microbiology results: 2/3 wound> staph simulans oxacillin resistant but otherwise sensitive 2/3 MRSA PCR: negative  Thank you for allowing pharmacy to be a part of this patient's care.   Joesph Specking, PharmD PGY1 Pharmacy Resident 02/18/2023 11:45 AM

## 2023-02-18 NOTE — Progress Notes (Signed)
    Providing Compassionate, Quality Care - Together   NEUROSURGERY PROGRESS NOTE     S: No issues overnight.    O: EXAM:  BP 102/63 (BP Location: Left Arm)   Pulse 72   Temp 98.3 F (36.8 C) (Oral)   Resp 17   Ht 5' 5 (1.651 m)   Wt (!) 158 kg   SpO2 96%   BMI 57.96 kg/m     Awake, alert, oriented  Speech fluent, appropriate  BUE/BLE strength grossly intact SILTx4 Incision c/d/I. Blisters at superior portion w/o surrounding erythema, edema, warmth.    ASSESSMENT:  53 y.o. s/p hardware removal/revision L4-5 PSF.     PLAN: -Awaiting placement -Cont supportive care -Therapies as tolerated -Call w/ questions/concerns.   Camie Pickle, [facility]

## 2023-02-18 NOTE — Plan of Care (Signed)
 A/O x 4, moves all extremities. Some pain/PRN provided relief. Dressing changed as ordered. No complications noted. VS stable

## 2023-02-19 LAB — GLUCOSE, CAPILLARY
Glucose-Capillary: 100 mg/dL — ABNORMAL HIGH (ref 70–99)
Glucose-Capillary: 101 mg/dL — ABNORMAL HIGH (ref 70–99)
Glucose-Capillary: 102 mg/dL — ABNORMAL HIGH (ref 70–99)
Glucose-Capillary: 116 mg/dL — ABNORMAL HIGH (ref 70–99)
Glucose-Capillary: 119 mg/dL — ABNORMAL HIGH (ref 70–99)
Glucose-Capillary: 138 mg/dL — ABNORMAL HIGH (ref 70–99)

## 2023-02-19 LAB — VANCOMYCIN, TROUGH: Vancomycin Tr: 15 ug/mL (ref 15–20)

## 2023-02-19 MED ORDER — VANCOMYCIN HCL 1500 MG/300ML IV SOLN
1500.0000 mg | INTRAVENOUS | Status: DC
  Administered 2023-02-19 – 2023-02-22 (×4): 1500 mg via INTRAVENOUS
  Filled 2023-02-19 (×5): qty 300

## 2023-02-19 MED ORDER — INSULIN ASPART 100 UNIT/ML IJ SOLN
0.0000 [IU] | Freq: Three times a day (TID) | INTRAMUSCULAR | Status: DC
  Administered 2023-02-19 – 2023-02-24 (×4): 3 [IU] via SUBCUTANEOUS

## 2023-02-19 MED ORDER — DIPHENHYDRAMINE HCL 25 MG PO CAPS
25.0000 mg | ORAL_CAPSULE | Freq: Four times a day (QID) | ORAL | Status: DC | PRN
  Administered 2023-02-19 – 2023-02-24 (×12): 25 mg via ORAL
  Filled 2023-02-19 (×12): qty 1

## 2023-02-19 NOTE — Plan of Care (Signed)
 Problem: Education: Goal: Knowledge of General Education information will improve Description: Including pain rating scale, medication(s)/side effects and non-pharmacologic comfort measures 02/19/2023 1923 by Waddell Colton BROCKS, RN Outcome: Progressing 02/19/2023 1923 by Waddell Colton BROCKS, RN Outcome: Progressing   Problem: Health Behavior/Discharge Planning: Goal: Ability to manage health-related needs will improve 02/19/2023 1923 by Waddell Colton BROCKS, RN Outcome: Progressing 02/19/2023 1923 by Waddell Colton BROCKS, RN Outcome: Progressing   Problem: Clinical Measurements: Goal: Ability to maintain clinical measurements within normal limits will improve 02/19/2023 1923 by Waddell Colton BROCKS, RN Outcome: Progressing 02/19/2023 1923 by Waddell Colton BROCKS, RN Outcome: Progressing Goal: Will remain free from infection 02/19/2023 1923 by Waddell Colton BROCKS, RN Outcome: Progressing 02/19/2023 1923 by Waddell Colton BROCKS, RN Outcome: Progressing Goal: Diagnostic test results will improve 02/19/2023 1923 by Waddell Colton BROCKS, RN Outcome: Progressing 02/19/2023 1923 by Waddell Colton BROCKS, RN Outcome: Progressing Goal: Respiratory complications will improve 02/19/2023 1923 by Waddell Colton BROCKS, RN Outcome: Progressing 02/19/2023 1923 by Waddell Colton BROCKS, RN Outcome: Progressing Goal: Cardiovascular complication will be avoided 02/19/2023 1923 by Waddell Colton BROCKS, RN Outcome: Progressing 02/19/2023 1923 by Waddell Colton BROCKS, RN Outcome: Progressing   Problem: Activity: Goal: Risk for activity intolerance will decrease 02/19/2023 1923 by Waddell Colton BROCKS, RN Outcome: Progressing 02/19/2023 1923 by Waddell Colton BROCKS, RN Outcome: Progressing   Problem: Nutrition: Goal: Adequate nutrition will be maintained 02/19/2023 1923 by Waddell Colton BROCKS, RN Outcome: Progressing 02/19/2023 1923 by Waddell Colton BROCKS, RN Outcome: Progressing   Problem: Coping: Goal: Level of anxiety will decrease 02/19/2023 1923 by Waddell Colton BROCKS, RN Outcome: Progressing 02/19/2023 1923 by Waddell Colton BROCKS, RN Outcome: Progressing   Problem: Elimination: Goal: Will not experience complications related to bowel motility 02/19/2023 1923 by Waddell Colton BROCKS, RN Outcome: Progressing 02/19/2023 1923 by Waddell Colton BROCKS, RN Outcome: Progressing Goal: Will not experience complications related to urinary retention 02/19/2023 1923 by Waddell Colton BROCKS, RN Outcome: Progressing 02/19/2023 1923 by Waddell Colton BROCKS, RN Outcome: Progressing   Problem: Pain Managment: Goal: General experience of comfort will improve and/or be controlled 02/19/2023 1923 by Waddell Colton BROCKS, RN Outcome: Progressing 02/19/2023 1923 by Waddell Colton BROCKS, RN Outcome: Progressing   Problem: Safety: Goal: Ability to remain free from injury will improve 02/19/2023 1923 by Waddell Colton BROCKS, RN Outcome: Progressing 02/19/2023 1923 by Waddell Colton BROCKS, RN Outcome: Progressing   Problem: Skin Integrity: Goal: Risk for impaired skin integrity will decrease 02/19/2023 1923 by Waddell Colton BROCKS, RN Outcome: Progressing 02/19/2023 1923 by Waddell Colton BROCKS, RN Outcome: Progressing   Problem: Education: Goal: Ability to describe self-care measures that may prevent or decrease complications (Diabetes Survival Skills Education) will improve 02/19/2023 1923 by Waddell Colton BROCKS, RN Outcome: Progressing 02/19/2023 1923 by Waddell Colton BROCKS, RN Outcome: Progressing Goal: Individualized Educational Video(s) 02/19/2023 1923 by Waddell Colton BROCKS, RN Outcome: Progressing 02/19/2023 1923 by Waddell Colton BROCKS, RN Outcome: Progressing   Problem: Coping: Goal: Ability to adjust to condition or change in health will improve 02/19/2023 1923 by Waddell Colton BROCKS, RN Outcome: Progressing 02/19/2023 1923 by Waddell Colton BROCKS, RN Outcome: Progressing   Problem: Fluid Volume: Goal: Ability to maintain a balanced intake and output will improve 02/19/2023 1923 by Waddell Colton BROCKS, RN Outcome: Progressing 02/19/2023 1923 by Waddell Colton BROCKS, RN Outcome: Progressing   Problem: Health  Behavior/Discharge Planning: Goal: Ability to identify and utilize available resources and services will improve 02/19/2023 1923 by Waddell Colton BROCKS, RN Outcome: Progressing 02/19/2023 1923 by  Waddell Colton BROCKS, RN Outcome: Progressing Goal: Ability to manage health-related needs will improve 02/19/2023 1923 by Waddell Colton BROCKS, RN Outcome: Progressing 02/19/2023 1923 by Waddell Colton BROCKS, RN Outcome: Progressing   Problem: Metabolic: Goal: Ability to maintain appropriate glucose levels will improve 02/19/2023 1923 by Waddell Colton BROCKS, RN Outcome: Progressing 02/19/2023 1923 by Waddell Colton BROCKS, RN Outcome: Progressing   Problem: Nutritional: Goal: Maintenance of adequate nutrition will improve 02/19/2023 1923 by Waddell Colton BROCKS, RN Outcome: Progressing 02/19/2023 1923 by Waddell Colton BROCKS, RN Outcome: Progressing Goal: Progress toward achieving an optimal weight will improve 02/19/2023 1923 by Waddell Colton BROCKS, RN Outcome: Progressing 02/19/2023 1923 by Waddell Colton BROCKS, RN Outcome: Progressing   Problem: Skin Integrity: Goal: Risk for impaired skin integrity will decrease 02/19/2023 1923 by Waddell Colton BROCKS, RN Outcome: Progressing 02/19/2023 1923 by Waddell Colton BROCKS, RN Outcome: Progressing   Problem: Tissue Perfusion: Goal: Adequacy of tissue perfusion will improve 02/19/2023 1923 by Waddell Colton BROCKS, RN Outcome: Progressing 02/19/2023 1923 by Waddell Colton BROCKS, RN Outcome: Progressing

## 2023-02-19 NOTE — Plan of Care (Signed)

## 2023-02-19 NOTE — Progress Notes (Signed)
    Providing Compassionate, Quality Care - Together   NEUROSURGERY PROGRESS NOTE     S: No issues overnight.    O: EXAM:  BP (!) 105/56 (BP Location: Left Arm)   Pulse 76   Temp 98.4 F (36.9 C) (Oral)   Resp 16   Ht 5' 5 (1.651 m)   Wt (!) 158 kg   SpO2 95%   BMI 57.96 kg/m       Awake, alert, oriented  Speech fluent, appropriate  BUE/BLE strength grossly intact SILTx4 Incision c/d/I. Blisters at superior portion w/o surrounding erythema, edema, warmth.    ASSESSMENT:  53 y.o. s/p hardware removal/revision L4-5 PSF.      PLAN: -Awaiting placement -Cont supportive care -Therapies as tolerated -Call w/ questions/concerns.   Camie Pickle, [facility]

## 2023-02-19 NOTE — Progress Notes (Signed)
 Pharmacy Antibiotic Note  John Cook is a 53 y.o. male admitted on 02/08/2023 with  failed lumbar hardware .  The patient had revision surgery on 02/13/23 and cultures obtained.  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  Pharmacy has been consulted for Vancomycin  dosing.  Today is d#5 of IV Vancomycin  therapy.  Plans for 2 weeks of IV Vancomycin  followed by Doxycycline .  2/8: Scr 1.11 down trending, stable  Vancomycin  pk 50, trough 15 with calculated AUC 730 on Vancomycin  2gm IV q24h.  Goal AUC 450-600.  Will adjust dose to 1500mg  IV q24h with estimated AUC of 547.  Plan: Change Vancomycin  to 1500mg  IV q24h Will continue monitor renal function  Follow up discharge plans  Height: 5' 5 (165.1 cm) Weight: (!) 158 kg (348 lb 5.2 oz) IBW/kg (Calculated) : 61.5  Temp (24hrs), Avg:98.4 F (36.9 C), Min:98.3 F (36.8 C), Max:98.5 F (36.9 C)  Recent Labs  Lab 02/16/23 0642 02/18/23 0559 02/18/23 1936 02/19/23 1515  CREATININE 1.23 1.11  --   --   VANCOTROUGH  --   --   --  15  VANCOPEAK  --   --  50*  --     Estimated Creatinine Clearance: 109 mL/min (by C-G formula based on SCr of 1.11 mg/dL).    Allergies  Allergen Reactions   Lisinopril Cough    Antimicrobials this admission: 2/3 vancomycin  1g x 1, cefazolin  3g x 1 2/5 vancomycin   >>   Dose adjustments this admission: NA  Microbiology results: 2/3 wound> staph simulans oxacillin resistant but otherwise sensitive 2/3 MRSA PCR: negative  Thank you for allowing pharmacy to be a part of this patient's care.  [employer], Pharm.D., BCPS Clinical Pharmacist  **Pharmacist phone directory can be found on [email or web address] listed under [facility].  02/19/2023 4:56 PM

## 2023-02-19 NOTE — Plan of Care (Signed)
  Problem: Education: Goal: Knowledge of General Education information will improve Description: Including pain rating scale, medication(s)/side effects and non-pharmacologic comfort measures Outcome: Progressing   Problem: Clinical Measurements: Goal: Ability to maintain clinical measurements within normal limits will improve Outcome: Progressing   Problem: Clinical Measurements: Goal: Will remain free from infection Outcome: Progressing   Problem: Elimination: Goal: Will not experience complications related to bowel motility Outcome: Progressing   Problem: Nutrition: Goal: Adequate nutrition will be maintained Outcome: Progressing

## 2023-02-20 LAB — GLUCOSE, CAPILLARY
Glucose-Capillary: 108 mg/dL — ABNORMAL HIGH (ref 70–99)
Glucose-Capillary: 113 mg/dL — ABNORMAL HIGH (ref 70–99)
Glucose-Capillary: 113 mg/dL — ABNORMAL HIGH (ref 70–99)
Glucose-Capillary: 114 mg/dL — ABNORMAL HIGH (ref 70–99)
Glucose-Capillary: 118 mg/dL — ABNORMAL HIGH (ref 70–99)
Glucose-Capillary: 133 mg/dL — ABNORMAL HIGH (ref 70–99)

## 2023-02-20 NOTE — Progress Notes (Signed)
  NEUROSURGERY PROGRESS NOTE   No issues overnight.   EXAM:  BP 126/71 (BP Location: Left Arm)   Pulse 91   Temp 100.1 F (37.8 C) (Oral)   Resp 18   Ht 5\' 5"  (1.651 m)   Wt (!) 158 kg   SpO2 95%   BMI 57.96 kg/m   Awake, alert, oriented  Speech fluent, appropriate  CN grossly intact  5/5 BUE/BLE  Prevena d/c'ed over weekend, dressing in place  IMPRESSION:  53 y.o. male POD#7 s/p hardware revision, progressing slowly. 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

## 2023-02-20 NOTE — Progress Notes (Signed)
 Physical Therapy Treatment Patient Details Name: John Cook MRN: [id number] DOB: Jun 03, 1970 Today's Date: 02/20/2023   History of Present Illness 53 yo male with worsening back pain with movement over the past few weeks. Transferred from [facility] after CT shows cage migration in the disc space since his L4-L5 PLIF 12/29/22. 02/13/23 removal of pedical screws and rods and placement of Posterior non-segmental instrumentation, L4-5.  PMH Lumbar fusion L4/5 on 12/29/23.with multiple falls postop, asthma, DM, GERD, HLD, HTN, obesity.    PT Comments  Pt resting in bed on arrival and agreeable to session with continued progress towards acute goals. Pt able to progress gait distance slightly this session with grossly CGA for safety and RW for support, pt requiring x1 standing rest due to fatigue. Pt with c/o L great toe and ball of foot pain prior to beginning session, pain able to replicated with palpation, mild erythema and edema of L great toe noted, RN updated and aware. Pt with noted supination of L foot during gait due to pain. Current plan remains appropriate to address deficits and maximize functional independence and decrease caregiver burden. Pt continues to benefit from skilled PT services to progress toward functional mobility goals.     If plan is discharge home, recommend the following: A little help with walking and/or transfers;Two people to help with bathing/dressing/bathroom;Assistance with cooking/housework;Direct supervision/assist for medications management;Direct supervision/assist for financial management;Assist for transportation;Help with stairs or ramp for entrance   Can travel by private vehicle     No  Equipment Recommendations  None recommended by PT    Recommendations for Other Services       Precautions / Restrictions Precautions Precautions: Back;Fall Precaution Comments: verbally reviewed, watch BP Required Braces or Orthoses: Spinal Brace Spinal Brace: Lumbar  corset Restrictions Weight Bearing Restrictions Per Provider Order: No     Mobility  Bed Mobility Overal bed mobility: Needs Assistance Bed Mobility: Rolling, Sidelying to Sit, Sit to Sidelying Rolling: Contact guard assist Sidelying to sit: Contact guard assist       General bed mobility comments: CGA for safety, increased time and effort, + bedrail use    Transfers Overall transfer level: Needs assistance Equipment used: Rolling walker (2 wheels) Transfers: Sit to/from Stand, Bed to chair/wheelchair/BSC Sit to Stand: Contact guard assist, From elevated surface, Min assist           General transfer comment: light min A to facilitate anterior weight shift and to boost to stand from low height surface    Ambulation/Gait Ambulation/Gait assistance: Contact guard assist Gait Distance (Feet): 100 Feet Assistive device: Rolling walker (2 wheels) Gait Pattern/deviations: Step-through pattern, Trunk flexed, Antalgic Gait velocity: slowed     General Gait Details: slow guarded gait with min A to manage RW, cues for closer RW proximity and upright trunk, increased supination of L foot due to pain, RN aware   Stairs             Wheelchair Mobility     Tilt Bed    Modified Rankin (Stroke Patients Only)       Balance Overall balance assessment: Needs assistance Sitting-balance support: Feet supported, No upper extremity supported Sitting balance-Leahy Scale: Good     Standing balance support: Bilateral upper extremity supported, During functional activity, Reliant on assistive device for balance Standing balance-Leahy Scale: Fair Standing balance comment: able to release RW for pericare  Cognition Arousal: Alert Behavior During Therapy: WFL for tasks assessed/performed Overall Cognitive Status: Within Functional Limits for tasks assessed                                          Exercises       General Comments        Pertinent Vitals/Pain Pain Assessment Pain Assessment: Faces Faces Pain Scale: Hurts little more Pain Location: L great toe and ball of foot Pain Descriptors / Indicators: Discomfort, Guarding, Grimacing, Sore Pain Intervention(s): Monitored during session, Limited activity within patient's tolerance, Premedicated before session    Home Living                          Prior Function            PT Goals (current goals can now be found in the care plan section) Acute Rehab PT Goals Patient Stated Goal: return home, independent PT Goal Formulation: With patient Time For Goal Achievement: 02/24/23 Progress towards PT goals: Progressing toward goals    Frequency    Min 1X/week      PT Plan      Co-evaluation              AM-PAC PT "6 Clicks" Mobility   Outcome Measure  Help needed turning from your back to your side while in a flat bed without using bedrails?: A Little Help needed moving from lying on your back to sitting on the side of a flat bed without using bedrails?: A Little Help needed moving to and from a bed to a chair (including a wheelchair)?: A Little Help needed standing up from a chair using your arms (e.g., wheelchair or bedside chair)?: A Little Help needed to walk in hospital room?: A Little Help needed climbing 3-5 steps with a railing? : Total 6 Click Score: 16    End of Session Equipment Utilized During Treatment: Back brace Activity Tolerance: Patient tolerated treatment well Patient left: in bed;with call bell/phone within reach Nurse Communication: Mobility status PT Visit Diagnosis: Other abnormalities of gait and mobility (R26.89);Pain Pain - part of body:  (back and LE)     Time: 1424-1440 PT Time Calculation (min) (ACUTE ONLY): 16 min  Charges:    $Gait Training: 8-22 mins PT General Charges $$ ACUTE PT VISIT: 1 Visit                     Massa Pe R. PTA Acute Rehabilitation  Services Office: [phone]   Agapito Horseman 02/20/2023, 4:23 PM

## 2023-02-21 LAB — BASIC METABOLIC PANEL
Anion gap: 10 (ref 5–15)
BUN: 20 mg/dL (ref 6–20)
CO2: 28 mmol/L (ref 22–32)
Calcium: 9.2 mg/dL (ref 8.9–10.3)
Chloride: 94 mmol/L — ABNORMAL LOW (ref 98–111)
Creatinine, Ser: 1.34 mg/dL — ABNORMAL HIGH (ref 0.61–1.24)
GFR, Estimated: >60 mL/min (ref >60)
Glucose, Bld: 76 mg/dL (ref 70–99)
Potassium: 3.8 mmol/L (ref 3.5–5.1)
Sodium: 132 mmol/L — ABNORMAL LOW (ref 135–145)

## 2023-02-21 LAB — GLUCOSE, CAPILLARY
Glucose-Capillary: 103 mg/dL — ABNORMAL HIGH (ref 70–99)
Glucose-Capillary: 110 mg/dL — ABNORMAL HIGH (ref 70–99)
Glucose-Capillary: 129 mg/dL — ABNORMAL HIGH (ref 70–99)
Glucose-Capillary: 86 mg/dL (ref 70–99)
Glucose-Capillary: 91 mg/dL (ref 70–99)
Glucose-Capillary: 96 mg/dL (ref 70–99)
Glucose-Capillary: 97 mg/dL (ref 70–99)

## 2023-02-21 NOTE — Progress Notes (Signed)
Pt able to walk to bathroom with assistance and roller walker. Np BM overnight. RN held night dose of Oxy and valium due to soft BP. Pt encouraged to increase PO intake and BP improved this AM to 119/52. 5mg  of oxy given this morning. CHG bath given. No acute distress overnight.

## 2023-02-21 NOTE — Progress Notes (Signed)
Occupational Therapy Treatment Patient Details Name: John Cook MRN: [id number] DOB: May 23, 1970 Today's Date: 02/21/2023   History of present illness 53 yo male with worsening back pain with movement over the past few weeks. Transferred from [facility] after CT shows cage migration in the disc space since his L4-L5 PLIF 12/29/22. 02/13/23 removal of pedical screws and rods and placement of Posterior non-segmental instrumentation, L4-5.  PMH Lumbar fusion L4/5 on 12/29/23.with multiple falls postop, asthma, DM, GERD, HLD, HTN, obesity.   OT comments  Pt progressing toward goals. Increasing independence with LB ADL with use of AE as noted below. Ambulated @ 150 ft @ RW level after ADL task with min vc for positioning in RW. Encouraged pt to ambulate frequently with staff. Feel patient will benefit from continued inpatient follow up therapy, <3 hours/day to maximize functional level of independence to facilitate safe DC home. Acute OT to follow.       If plan is discharge home, recommend the following:  A little help with walking and/or transfers;A lot of help with bathing/dressing/bathroom;Assist for transportation   Equipment Recommendations  None recommended by OT    Recommendations for Other Services      Precautions / Restrictions Precautions Precautions: Back;Fall Precaution Booklet Issued: Yes (comment) Required Braces or Orthoses: Spinal Brace Spinal Brace: Lumbar corset Restrictions Weight Bearing Restrictions Per Provider Order: No       Mobility Bed Mobility Overal bed mobility: Modified Independent Bed Mobility: Sidelying to Sit                Transfers Overall transfer level: Needs assistance Equipment used: Rolling walker (2 wheels) Transfers: Sit to/from Stand, Bed to chair/wheelchair/BSC Sit to Stand: Contact guard assist                Balance Overall balance assessment: Needs assistance Sitting-balance support: Feet supported, No upper extremity  supported Sitting balance-Leahy Scale: Good     Standing balance support: Bilateral upper extremity supported, During functional activity, Reliant on assistive device for balance Standing balance-Leahy Scale: Fair Standing balance comment: able to release RW for pericare                           ADL either performed or assessed with clinical judgement   ADL Overall ADL's : Needs assistance/impaired Eating/Feeding: Independent   Grooming: Set up;Sitting   Upper Body Bathing: Set up;Sitting   Lower Body Bathing: Moderate assistance;Sit to/from stand;With adaptive equipment   Upper Body Dressing : Set up;Sitting   Lower Body Dressing: Moderate assistance;Sit to/from stand;With adaptive equipment;Cueing for back precautions;Minimal assistance;Maximal assistance   Toilet Transfer: Contact guard assist;Ambulation   Toileting- Clothing Manipulation and Hygiene: Moderate assistance;With adaptive equipment Toileting - Clothing Manipulation Details (indicate cue type and reason): pt states he is unable to rech to complete pericare due to set up of toilet and limited space; has toilet tong to use     Functional mobility during ADLs: Rolling walker (2 wheels);Contact guard assist General ADL Comments: practiced using AE for LB ADL; pt able to return demonstrate with mi vc    Extremity/Trunk Assessment              Vision       Perception     Praxis     Communication Communication Communication: No apparent difficulties   Cognition Arousal: Alert Behavior During Therapy: WFL for tasks assessed/performed Cognition: No apparent impairments  Cueing   Cueing Techniques: Verbal cues  Exercises Exercises: Other exercises Other Exercises Other Exercises: modified "dead bed" core wrok when sitting    Shoulder Instructions       General Comments      Pertinent Vitals/ Pain       Pain Assessment Pain  Assessment: 0-10 Pain Score: 8  (with mobility) Pain Location: back Pain Descriptors / Indicators: Discomfort, Guarding, Grimacing, Sore  Home Living                                          Prior Functioning/Environment              Frequency  Min 1X/week        Progress Toward Goals  OT Goals(current goals can now be found in the care plan section)  Progress towards OT goals: Progressing toward goals  Acute Rehab OT Goals Patient Stated Goal: to go to rehab and get stronger OT Goal Formulation: With patient Time For Goal Achievement: 02/24/23 Potential to Achieve Goals: Good ADL Goals Pt Will Perform Lower Body Bathing: with set-up;with supervision;sit to/from stand;with adaptive equipment Pt Will Perform Lower Body Dressing: with set-up;with supervision;sit to/from stand;with adaptive equipment Pt Will Transfer to Toilet: ambulating;with supervision;bedside commode Pt Will Perform Toileting - Clothing Manipulation and hygiene: with set-up;with supervision;sitting/lateral leans;sit to/from stand;with adaptive equipment Additional ADL Goal #1: Patient will be able to complete functional task in standing for 3-5 minutes prior to needing seated rest break in order to improve overall activity tolerance.  Plan      Co-evaluation                 AM-PAC OT "6 Clicks" Daily Activity     Outcome Measure   Help from another person eating meals?: None Help from another person taking care of personal grooming?: A Little Help from another person toileting, which includes using toliet, bedpan, or urinal?: A Lot Help from another person bathing (including washing, rinsing, drying)?: A Lot Help from another person to put on and taking off regular upper body clothing?: A Little Help from another person to put on and taking off regular lower body clothing?: A Little 6 Click Score: 17    End of Session Equipment Utilized During Treatment: Rolling walker  (2 wheels)  OT Visit Diagnosis: Unsteadiness on feet (R26.81);Muscle weakness (generalized) (M62.81);Repeated falls (R29.6);Pain Pain - part of body:  (back)   Activity Tolerance Patient tolerated treatment well   Patient Left with call bell/phone within reach;in chair   Nurse Communication Mobility status (encourage ambulation with staff)        Time: 1610-9604 OT Time Calculation (min): 27 min  Charges: OT General Charges $OT Visit: 1 Visit OT Treatments $Self Care/Home Management : 23-37 mins  Luisa Dago, OT/L   Acute OT Clinical Specialist Acute Rehabilitation Services Pager [phone] Office [phone]   [facility] 02/21/2023, 3:18 PM

## 2023-02-21 NOTE — Plan of Care (Signed)

## 2023-02-21 NOTE — Progress Notes (Signed)
  NEUROSURGERY PROGRESS NOTE   No issues overnight.   EXAM:  BP (!) 96/46 (BP Location: Left Arm)   Pulse 76   Temp 98 F (36.7 C) (Oral)   Resp 19   Ht 5\' 5"  (1.651 m)   Wt (!) 158 kg   SpO2 93%   BMI 57.96 kg/m   Awake, alert, oriented  Speech fluent, appropriate  CN grossly intact  5/5 BUE/BLE  Wound appears c/d/i  IMPRESSION:  53 y.o. male POD#8 s/p hardware revision, progressing slowly.  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 If no offers/beds in the next day or two, may need to consider d/c home with HHPT/OT and possible home aide.   Lisbeth Renshaw, MD [facility]

## 2023-02-21 NOTE — Plan of Care (Signed)
  Problem: Health Behavior/Discharge Planning: Goal: Ability to manage health-related needs will improve Outcome: Progressing   Problem: Clinical Measurements: Goal: Diagnostic test results will improve Outcome: Progressing   Problem: Elimination: Goal: Will not experience complications related to bowel motility Outcome: Progressing   Problem: Coping: Goal: Level of anxiety will decrease Outcome: Progressing   Problem: Nutrition: Goal: Adequate nutrition will be maintained Outcome: Progressing

## 2023-02-22 LAB — VANCOMYCIN, PEAK: Vancomycin Pk: 31 ug/mL (ref 30–40)

## 2023-02-22 LAB — GLUCOSE, CAPILLARY
Glucose-Capillary: 107 mg/dL — ABNORMAL HIGH (ref 70–99)
Glucose-Capillary: 107 mg/dL — ABNORMAL HIGH (ref 70–99)
Glucose-Capillary: 114 mg/dL — ABNORMAL HIGH (ref 70–99)
Glucose-Capillary: 127 mg/dL — ABNORMAL HIGH (ref 70–99)
Glucose-Capillary: 139 mg/dL — ABNORMAL HIGH (ref 70–99)
Glucose-Capillary: 99 mg/dL (ref 70–99)

## 2023-02-22 NOTE — Progress Notes (Signed)
Physical Therapy Treatment Patient Details Name: John Cook MRN: [id number] DOB: [birth year] Today's Date: 02/22/2023   History of Present Illness 53 yo male with worsening back pain with movement over the past few weeks. Transferred from [facility] after CT shows cage migration in the disc space since his L4-L5 PLIF 12/29/22. 02/13/23 removal of pedical screws and rods and placement of Posterior non-segmental instrumentation, L4-5.  PMH Lumbar fusion L4/5 on 12/29/23.with multiple falls postop, asthma, DM, GERD, HLD, HTN, obesity.    PT Comments  Pt resting in bed on arrival, pleasant and agreeable to session with continued progress towards acute goals. Pt continues to require up to min A with RW for support for hallway gait as pt limited in safe mobility by impaired balance/postural reactions, decreased activity tolerance, and LE weakness. Pt guarded throughout gait, keeping gaze fixed forward with x1 instance of bumping hallway obstacle not in direct line of vision and needing increased cues for environmental scanning. Pt continues to be at high risk for falls and without adequate home support and will benefit from continued inpatient follow up therapy, <3 hours/day to maximize safe mobility and functional independence. Will continue to follow acutely.    If plan is discharge home, recommend the following: A little help with walking and/or transfers;Two people to help with bathing/dressing/bathroom;Assistance with cooking/housework;Direct supervision/assist for medications management;Direct supervision/assist for financial management;Assist for transportation;Help with stairs or ramp for entrance   Can travel by private vehicle     No  Equipment Recommendations  None recommended by PT    Recommendations for Other Services       Precautions / Restrictions Precautions Precautions: Back;Fall Precaution Booklet Issued: Yes (comment) Precaution/Restrictions Comments: verbally reviewed, watch  BP Required Braces or Orthoses: Spinal Brace Spinal Brace: Lumbar corset Restrictions Weight Bearing Restrictions Per Provider Order: No     Mobility  Bed Mobility Overal bed mobility: Needs Assistance Bed Mobility: Sidelying to Sit Rolling: Contact guard assist Sidelying to sit: Contact guard assist       General bed mobility comments: CGA for safety, increased time and effort, + bedrail use    Transfers Overall transfer level: Needs assistance Equipment used: Rolling walker (2 wheels) Transfers: Sit to/from Stand, Bed to chair/wheelchair/BSC Sit to Stand: From elevated surface, Min assist           General transfer comment: light min A to facilitate anterior weight shift and to boost to stand from low height surface    Ambulation/Gait Ambulation/Gait assistance: Contact guard assist Gait Distance (Feet): 60 Feet (x3) Assistive device: Rolling walker (2 wheels) Gait Pattern/deviations: Step-through pattern, Trunk flexed, Antalgic Gait velocity: slowed     General Gait Details: slow guarded gait with min A to manage RW epecially in tight spaces as pt keeping gaze ahead and bumping obstacles outside field of vision on R, cues for closer RW proximity and upright trunk with pt able to correct but unable to maintain, pt needing x3 standing rest breaks due to fatigue, noted DOE 2/4   Stairs             Wheelchair Mobility     Tilt Bed    Modified Rankin (Stroke Patients Only)       Balance Overall balance assessment: Needs assistance Sitting-balance support: Feet supported, No upper extremity supported Sitting balance-Leahy Scale: Good     Standing balance support: Bilateral upper extremity supported, During functional activity, Reliant on assistive device for balance Standing balance-Leahy Scale: Fair Standing balance comment: heavy reliance on  UE support                            Communication Communication Communication: No apparent  difficulties  Cognition Arousal: Alert Behavior During Therapy: WFL for tasks assessed/performed   PT - Cognitive impairments: No apparent impairments                         Following commands: Intact      Cueing Cueing Techniques: Verbal cues  Exercises      General Comments        Pertinent Vitals/Pain Pain Assessment Pain Assessment: 0-10 Pain Score: 8  Pain Location: back Pain Descriptors / Indicators: Discomfort, Guarding, Grimacing, Sore Pain Intervention(s): Monitored during session, Limited activity within patient's tolerance, Patient requesting pain meds-RN notified    Home Living                          Prior Function            PT Goals (current goals can now be found in the care plan section) Acute Rehab PT Goals Patient Stated Goal: return home, independent PT Goal Formulation: With patient Time For Goal Achievement: 02/24/23 Progress towards PT goals: Progressing toward goals    Frequency    Min 1X/week      PT Plan      Co-evaluation              AM-PAC PT "6 Clicks" Mobility   Outcome Measure  Help needed turning from your back to your side while in a flat bed without using bedrails?: A Lot Help needed moving from lying on your back to sitting on the side of a flat bed without using bedrails?: A Lot Help needed moving to and from a bed to a chair (including a wheelchair)?: A Little Help needed standing up from a chair using your arms (e.g., wheelchair or bedside chair)?: A Little Help needed to walk in hospital room?: A Little Help needed climbing 3-5 steps with a railing? : Total 6 Click Score: 14    End of Session Equipment Utilized During Treatment: Back brace;Gait belt Activity Tolerance: Patient tolerated treatment well Patient left: in bed;with call bell/phone within reach;with nursing/sitter in room;Other (comment) (seated up EOB) Nurse Communication: Mobility status PT Visit Diagnosis: Other  abnormalities of gait and mobility (R26.89);Pain Pain - part of body:  (back and LE)     Time: 1610-9604 PT Time Calculation (min) (ACUTE ONLY): 25 min  Charges:    $Gait Training: 8-22 mins $Therapeutic Activity: 8-22 mins PT General Charges $$ ACUTE PT VISIT: 1 Visit                     Cieara Stierwalt R. PTA Acute Rehabilitation Services Office: [phone]   Catalina Antigua 02/22/2023, 4:16 PM

## 2023-02-22 NOTE — Progress Notes (Signed)
Pharmacy Antibiotic Note  John Cook is a 53 y.o. male admitted on 02/08/2023 with  failed lumbar hardware .  The patient had revision surgery on 02/13/23 and cultures obtained.  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  Pharmacy has been consulted for Vancomycin dosing.  Today is d#8 of IV Vancomycin therapy.  Plans for 2 weeks of IV Vancomycin followed by Doxycycline.  Vancomycin levels obtained 2/9 and dose reduced.  Patient with slight SCr increase today 1.11 > 1.34.  Will obtain levels on new regimen. Goal AUC 450-600.  Plan: Continue Vancomycin to 1500mg  IV q24h Check Vancomycin peak level 2/12 at 2100 Check Vancomycin trough level 2/13 at 1700 Will continue monitor renal function  Follow up discharge plans  Height: 5\' 5"  (165.1 cm) Weight: (!) 158 kg (348 lb 5.2 oz) IBW/kg (Calculated) : 61.5  Temp (24hrs), Avg:98.1 F (36.7 C), Min:97.7 F (36.5 C), Max:98.5 F (36.9 C)  Recent Labs  Lab 02/16/23 0642 02/18/23 0559 02/18/23 1936 02/19/23 1515 02/21/23 0948  CREATININE 1.23 1.11  --   --  1.34*  VANCOTROUGH  --   --   --  15  --   VANCOPEAK  --   --  50*  --   --     Estimated Creatinine Clearance: 90.3 mL/min (A) (by C-G formula based on SCr of 1.34 mg/dL (H)).    Allergies  Allergen Reactions   Lisinopril Cough    Antimicrobials this admission: 2/3 vancomycin 1g x 1, cefazolin 3g x 1 2/5 vancomycin  >>   Dose adjustments this admission: NA  Microbiology results: 2/3 wound> staph simulans oxacillin resistant but otherwise sensitive 2/3 MRSA PCR: negative  Thank you for allowing pharmacy to be a part of this patient's care.  [employer], Pharm.D., BCPS Clinical Pharmacist  **Pharmacist phone directory can be found on [email or web address] listed under [facility].  02/22/2023 1:26 PM

## 2023-02-22 NOTE — TOC Progression Note (Signed)
Transition of Care [facility]) - Progression Note    Patient Details  Name: John Cook MRN: [id number] Date of Birth: [birth year]  [facility] CM/SW Contact  Aveya Beal Felipa [place], Student-Social Work Phone Number: 02/22/2023, 3:40 PM  Clinical Narrative:   MSW Student met with patient at bedside and informed him that SNF [facility] has accepted him. Patient agreeable with SNF choice. SNF Auth has been started for patient     Expected Discharge Plan: Skilled Nursing Facility Barriers to Discharge: Continued Medical Work up, English as a second language teacher, Inadequate or no insurance, SNF Pending bed offer  Expected Discharge Plan and Services   Discharge Planning Services: CM Consult   Living arrangements for the past 2 months: Mobile Home                                       Social Determinants of Health (SDOH) Interventions SDOH Screenings   Food Insecurity: Food Insecurity Present (02/09/2023)  Housing: High Risk (02/09/2023)  Transportation Needs: Unmet Transportation Needs (02/09/2023)  Utilities: Not At Risk (02/09/2023)  Tobacco Use: Medium Risk (02/13/2023)    Readmission Risk Interventions     No data to display

## 2023-02-22 NOTE — Progress Notes (Signed)
  NEUROSURGERY PROGRESS NOTE   No issues overnight.   EXAM:  BP 113/66 (BP Location: Left Arm)   Pulse 88   Temp 98.2 F (36.8 C) (Oral)   Resp 17   Ht 5\' 5"  (1.651 m)   Wt (!) 158 kg   SpO2 96%   BMI 57.96 kg/m   Awake, alert, oriented  Speech fluent, appropriate  CN grossly intact  5/5 BUE/BLE  Wound appears c/d/i  IMPRESSION:  53 y.o. male POD#9 s/p hardware revision, progressing slowly.  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 Unfortunately per SW, pts Medicaid does not have home health coverage so will need SNF.   Lisbeth Renshaw, MD [facility]

## 2023-02-23 ENCOUNTER — Other Ambulatory Visit: Payer: Self-pay

## 2023-02-23 DIAGNOSIS — M4726 Other spondylosis with radiculopathy, lumbar region: Secondary | ICD-10-CM | POA: Diagnosis not present

## 2023-02-23 LAB — CBC WITH DIFFERENTIAL/PLATELET
Abs Immature Granulocytes: 0.13 10*3/uL — ABNORMAL HIGH (ref 0.00–0.07)
Basophils Absolute: 0.1 10*3/uL (ref 0.0–0.1)
Basophils Relative: 0 %
Eosinophils Absolute: 0.7 10*3/uL — ABNORMAL HIGH (ref 0.0–0.5)
Eosinophils Relative: 6 %
HCT: 35.6 % — ABNORMAL LOW (ref 39.0–52.0)
Hemoglobin: 11.6 g/dL — ABNORMAL LOW (ref 13.0–17.0)
Immature Granulocytes: 1 %
Lymphocytes Relative: 27 %
Lymphs Abs: 3.1 10*3/uL (ref 0.7–4.0)
MCH: 27.6 pg (ref 26.0–34.0)
MCHC: 32.6 g/dL (ref 30.0–36.0)
MCV: 84.8 fL (ref 80.0–100.0)
Monocytes Absolute: 1.1 10*3/uL — ABNORMAL HIGH (ref 0.1–1.0)
Monocytes Relative: 10 %
Neutro Abs: 6.3 10*3/uL (ref 1.7–7.7)
Neutrophils Relative %: 56 %
Platelets: 332 10*3/uL (ref 150–400)
RBC: 4.2 MIL/uL — ABNORMAL LOW (ref 4.22–5.81)
RDW: 14.1 % (ref 11.5–15.5)
WBC: 11.4 10*3/uL — ABNORMAL HIGH (ref 4.0–10.5)
nRBC: 0 % (ref 0.0–0.2)

## 2023-02-23 LAB — GLUCOSE, CAPILLARY
Glucose-Capillary: 99 mg/dL (ref 70–99)
Glucose-Capillary: 101 mg/dL — ABNORMAL HIGH (ref 70–99)
Glucose-Capillary: 108 mg/dL — ABNORMAL HIGH (ref 70–99)
Glucose-Capillary: 117 mg/dL — ABNORMAL HIGH (ref 70–99)
Glucose-Capillary: 128 mg/dL — ABNORMAL HIGH (ref 70–99)

## 2023-02-23 LAB — BASIC METABOLIC PANEL
Anion gap: 12 (ref 5–15)
BUN: 16 mg/dL (ref 6–20)
CO2: 26 mmol/L (ref 22–32)
Calcium: 9.3 mg/dL (ref 8.9–10.3)
Chloride: 95 mmol/L — ABNORMAL LOW (ref 98–111)
Creatinine, Ser: 1.39 mg/dL — ABNORMAL HIGH (ref 0.61–1.24)
GFR, Estimated: >60 mL/min (ref >60)
Glucose, Bld: 107 mg/dL — ABNORMAL HIGH (ref 70–99)
Potassium: 3.9 mmol/L (ref 3.5–5.1)
Sodium: 133 mmol/L — ABNORMAL LOW (ref 135–145)

## 2023-02-23 LAB — CK: Total CK: 70 U/L (ref 49–397)

## 2023-02-23 LAB — C-REACTIVE PROTEIN: CRP: 5.7 mg/dL — ABNORMAL HIGH (ref <1.0)

## 2023-02-23 LAB — SEDIMENTATION RATE: Sed Rate: 80 mm/h — ABNORMAL HIGH (ref 0–16)

## 2023-02-23 MED ORDER — SODIUM CHLORIDE 0.9% FLUSH: 10.0000 mL | INTRAVENOUS | Status: DC | PRN

## 2023-02-23 MED ORDER — SODIUM CHLORIDE 0.9 % IV SOLN
8.0000 mg/kg | Freq: Every day | INTRAVENOUS | Status: DC
  Administered 2023-02-23 – 2023-02-24 (×2): 800 mg via INTRAVENOUS
  Filled 2023-02-23 (×2): qty 16

## 2023-02-23 NOTE — Consult Note (Signed)
[facility]  Total days of antibiotics 7 Referring Physician: [email or web address] Reason for consult: ssi, organ space infection with staph simulans  Principal Problem:   Other spondylosis with radiculopathy, lumbar region    HPI: John Cook is a 53 y.o. 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 He states that it was healing well, no drainage to incision. He started to have intractable back pain after 2-3 wks post surgery. Repeat imaging showed that there was loosening of screws and cage migration. Dr Conchita Paris took patient back to the OR on 2/3 for revision. There was small amount of fluid in the subfascial space that swab culture was sent. Removal of old screws and new instrumentation was placed. 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 The patient's pain is improving. ID asked to weigh in for abtx management. Recent labs showing increase in creatinine while on 6 days of vancomycin  Past Medical History:  Diagnosis Date   Arthritis    Asthma    Diabetes mellitus without complication (HCC)    GERD (gastroesophageal reflux disease)    History of hiatal hernia    Hyperlipidemia    Hypertension    Mixed hyperlipidemia    Neuropathy    Prediabetes    Sleep apnea     Allergies:  Allergies  Allergen Reactions   Lisinopril Cough     MEDICATIONS:  atenolol  50 mg Oral Daily   Chlorhexidine Gluconate Cloth  6 each Topical Q0600   DULoxetine  30 mg Oral BID   fenofibrate  160 mg Oral Daily   gabapentin  600 mg Oral BID   heparin  5,000 Units Subcutaneous Q8H   insulin aspart  0-20 Units Subcutaneous TID AC & HS   metFORMIN  500 mg Oral Q breakfast   oxybutynin  5 mg Oral Daily   pantoprazole  40 mg Oral Daily   rosuvastatin  20 mg Oral QHS   senna  1 tablet Oral BID   triamterene-hydrochlorothiazide  1 tablet Oral Daily    Social History   Tobacco Use   Smoking  status: Former    Current packs/day: 0.00    Types: Cigarettes    Quit date: 09/23/1995    Years since quitting: 27.4   Smokeless tobacco: Never  Vaping Use   Vaping status: Never Used  Substance Use Topics   Alcohol use: Not Currently   Drug use: No    Family History  Problem Relation Age of Onset   Hypertension Mother    COPD Mother    Heart disease Mother    Diabetes Mother    Heart attack Father      Review of Systems  Constitutional: Negative for fever, chills, diaphoresis, activity change, appetite change, fatigue and unexpected weight change.  HENT: Negative for congestion, sore throat, rhinorrhea, sneezing, trouble swallowing and sinus pressure.  Eyes: Negative for photophobia and visual disturbance.  Respiratory: Negative for cough, chest tightness, shortness of breath, wheezing and stridor.  Cardiovascular: Negative for chest pain, palpitations and leg swelling.  Gastrointestinal: Negative for nausea, vomiting, abdominal pain, diarrhea, constipation, blood in stool, abdominal distention and anal bleeding.  Genitourinary: Negative for dysuria, hematuria, flank pain and difficulty urinating.  Musculoskeletal: Negative for myalgias, back pain, joint swelling, arthralgias and gait problem.  Skin: Negative for color change, pallor, rash and wound.  Neurological: Negative for dizziness, tremors, weakness and light-headedness.  Hematological:  Negative for adenopathy. Does not bruise/bleed easily.  Psychiatric/Behavioral: Negative for behavioral problems, confusion, sleep disturbance, dysphoric mood, decreased concentration and agitation.    OBJECTIVE: Temp:  [97.9 F (36.6 C)-99.4 F (37.4 C)] 99.3 F (37.4 C) (02/13 1126) Pulse Rate:  [85-105] 87 (02/13 1126) Resp:  [16-18] 17 (02/13 1126) BP: (94-138)/(50-83) 118/56 (02/13 1126) SpO2:  [94 %-99 %] 94 % (02/13 1126) Physical Exam  Constitutional: He is oriented to person, place, and time. He appears well-developed  and well-nourished. No distress.  HENT:  Mouth/Throat: Oropharynx is clear and moist. No oropharyngeal exudate.  Cardiovascular: Normal rate, regular rhythm and normal heart sounds. Exam reveals no gallop and no friction rub.  No murmur heard.  Pulmonary/Chest: Effort normal and breath sounds normal. No respiratory distress. He has no wheezes.  Abdominal: Soft. Bowel sounds are normal. He exhibits no distension. There is no tenderness.  Lymphadenopathy:  He has no cervical adenopathy.  Back: honeycomb dressing not showing any surrounding erythema Neurological: He is alert and oriented to person, place, and time.  Skin: Skin is warm and dry. No rash noted. No erythema.  Psychiatric: He has a normal mood and affect. His behavior is normal.    LABS: Results for orders placed or performed during the hospital encounter of 02/08/23 (from the past 48 hours)  Glucose, capillary     Status: None   Collection Time: 02/21/23  4:33 PM  Result Value Ref Range   Glucose-Capillary 86 70 - 99 mg/dL    Comment: Glucose reference range applies only to samples taken after fasting for at least 8 hours.  Glucose, capillary     Status: Abnormal   Collection Time: 02/21/23  8:13 PM  Result Value Ref Range   Glucose-Capillary 103 (H) 70 - 99 mg/dL    Comment: Glucose reference range applies only to samples taken after fasting for at least 8 hours.  Glucose, capillary     Status: None   Collection Time: 02/21/23 11:49 PM  Result Value Ref Range   Glucose-Capillary 97 70 - 99 mg/dL    Comment: Glucose reference range applies only to samples taken after fasting for at least 8 hours.   Comment 1 Notify RN   Glucose, capillary     Status: Abnormal   Collection Time: 02/22/23  3:24 AM  Result Value Ref Range   Glucose-Capillary 107 (H) 70 - 99 mg/dL    Comment: Glucose reference range applies only to samples taken after fasting for at least 8 hours.  Glucose, capillary     Status: Abnormal   Collection  Time: 02/22/23  8:34 AM  Result Value Ref Range   Glucose-Capillary 139 (H) 70 - 99 mg/dL    Comment: Glucose reference range applies only to samples taken after fasting for at least 8 hours.  Glucose, capillary     Status: None   Collection Time: 02/22/23 11:24 AM  Result Value Ref Range   Glucose-Capillary 99 70 - 99 mg/dL    Comment: Glucose reference range applies only to samples taken after fasting for at least 8 hours.  Glucose, capillary     Status: Abnormal   Collection Time: 02/22/23  3:40 PM  Result Value Ref Range   Glucose-Capillary 107 (H) 70 - 99 mg/dL    Comment: Glucose reference range applies only to samples taken after fasting for at least 8 hours.  Glucose, capillary     Status: Abnormal   Collection Time: 02/22/23  8:01 PM  Result Value Ref  Range   Glucose-Capillary 127 (H) 70 - 99 mg/dL    Comment: Glucose reference range applies only to samples taken after fasting for at least 8 hours.   Comment 1 Notify RN    Comment 2 Document in Chart   Vancomycin, peak     Status: None   Collection Time: 02/22/23  9:10 PM  Result Value Ref Range   Vancomycin Pk 31 30 - 40 ug/mL    Comment: Performed at [facility], [address], [place], [place] [address]  Glucose, capillary     Status: Abnormal   Collection Time: 02/22/23 11:52 PM  Result Value Ref Range   Glucose-Capillary 114 (H) 70 - 99 mg/dL    Comment: Glucose reference range applies only to samples taken after fasting for at least 8 hours.   Comment 1 Notify RN    Comment 2 Document in Chart   Glucose, capillary     Status: None   Collection Time: 02/23/23  4:13 AM  Result Value Ref Range   Glucose-Capillary 99 70 - 99 mg/dL    Comment: Glucose reference range applies only to samples taken after fasting for at least 8 hours.   Comment 1 Notify RN    Comment 2 Document in Chart   Glucose, capillary     Status: Abnormal   Collection Time: 02/23/23  7:48 AM  Result Value Ref Range   Glucose-Capillary  101 (H) 70 - 99 mg/dL    Comment: Glucose reference range applies only to samples taken after fasting for at least 8 hours.  Glucose, capillary     Status: Abnormal   Collection Time: 02/23/23 11:25 AM  Result Value Ref Range   Glucose-Capillary 108 (H) 70 - 99 mg/dL    Comment: Glucose reference range applies only to samples taken after fasting for at least 8 hours.    MICRO: Staphylococcus simulans      MIC    CIPROFLOXACIN <=0.5 SENSI... Sensitive    CLINDAMYCIN <=0.25 SENS... Sensitive    ERYTHROMYCIN <=0.25 SENS... Sensitive    GENTAMICIN <=0.5 SENSI... Sensitive    Inducible Clindamycin NEGATIVE Sensitive    OXACILLIN >=4 RESISTANT Resistant    RIFAMPIN <=0.5 SENSI... Sensitive    TETRACYCLINE <=1 SENSITIVE Sensitive    TRIMETH/SULFA <=10 SENSIT... Sensitive    VANCOMYCIN <=0.5 SENSI... Sensitive    IMAGING: No results found.  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 And follow up appointment  Diagnosis: Vertebral HW infection   Culture Result: staph simulans  Allergies  Allergen Reactions   Lisinopril Cough    OPAT Orders Discharge antibiotics to be given via PICC line Discharge antibiotics: Per pharmacy protocol  daptomycin 8mg /kg/day  Duration: 5 wk End Date: March 20th  [facility] Care Per Protocol:  Home health RN for IV administration and teaching; PICC line care and labs.    Labs weekly while on IV antibiotics: x__ CBC with differential _x_ BMP  _x_ CRP x__ ESR  _x_ CK  _x_ Please pull PIC at completion of IV antibiotics  Fax weekly  labs to [phone]  Clinic Follow Up Appt: In 4-5 wk with Demani Mcbrien  @ [facility]

## 2023-02-23 NOTE — Progress Notes (Signed)
PHARMACY CONSULT NOTE FOR:  OUTPATIENT  PARENTERAL ANTIBIOTIC THERAPY (OPAT)  Informational as the patient will receive antibiotics at the SNF  Indication: Lumbar wound infection Regimen: Daptomycin 800 mg IV every 24 hours End date: 03/30/23  Given indication of rosuvastatin for dyslipidemia, no cardiac/stroke indication noted. Will plan to hold the statin while they are on Daptomycin therapy.   IV antibiotic discharge orders are pended. To discharging provider:  please sign these orders via discharge navigator,  Select New Orders & click on the button choice - Manage This Unsigned Work.     Thank you for allowing pharmacy to be a part of this patient's care.  Georgina Pillion, PharmD, BCPS, BCIDP Infectious Diseases Clinical Pharmacist 02/23/2023 3:14 PM   **Pharmacist phone directory can now be found on [email or web address] ([email or web address]).  Listed under [facility].

## 2023-02-23 NOTE — TOC Progression Note (Signed)
Transition of Care [facility]) - Progression Note    Patient Details  Name: John Cook MRN: [id number] Date of Birth: [birth year]  [facility]) CM/SW Contact  Baldemar Lenis, [place] Phone Number: 02/23/2023, 3:14 PM  Clinical Narrative:   CSW coordinated with medical team on patient's antibiotic recommendations, sent to [facility], and confirmed that authorization was approved. Patient approved for today or tomorrow, will need updated clinicals sent if he does not admit tomorrow. CSW updated medical team, planning for admission to SNF tomorrow. Patient made aware. CSW to follow.    Expected Discharge Plan: Skilled Nursing Facility Barriers to Discharge: Continued Medical Work up  Expected Discharge Plan and Services   Discharge Planning Services: CM Consult   Living arrangements for the past 2 months: Mobile Home                                       Social Determinants of Health (SDOH) Interventions SDOH Screenings   Food Insecurity: Food Insecurity Present (02/09/2023)  Housing: High Risk (02/09/2023)  Transportation Needs: Unmet Transportation Needs (02/09/2023)  Utilities: Not At Risk (02/09/2023)  Tobacco Use: Medium Risk (02/13/2023)    Readmission Risk Interventions     No data to display

## 2023-02-23 NOTE — Progress Notes (Signed)
Peripherally Inserted Central Catheter Placement  The IV Nurse has discussed with the patient and/or persons authorized to consent for the patient, the purpose of this procedure and the potential benefits and risks involved with this procedure.  The benefits include less needle sticks, lab draws from the catheter, and the patient may be discharged home with the catheter. Risks include, but not limited to, infection, bleeding, blood clot (thrombus formation), and puncture of an artery; nerve damage and irregular heartbeat and possibility to perform a PICC exchange if needed/ordered by physician.  Alternatives to this procedure were also discussed.  Bard Power PICC patient education guide, fact sheet on infection prevention and patient information card has been provided to patient /or left at bedside.    PICC Placement Documentation  PICC Single Lumen 02/23/23 Right Basilic 44 cm 0 cm (Active)  Indication for Insertion or Continuance of Line Prolonged intravenous therapies 02/23/23 2230  Exposed Catheter (cm) 0 cm 02/23/23 2230  Site Assessment Clean, Dry, Intact 02/23/23 2230  Line Status Flushed;Saline locked;Blood return noted 02/23/23 2230  Dressing Type Transparent;Securing device 02/23/23 2230  Dressing Status Antimicrobial disc/dressing in place;Clean, Dry, Intact 02/23/23 2230  Line Care Connections checked and tightened 02/23/23 2230  Line Adjustment (NICU/IV Team Only) No 02/23/23 2230  Dressing Intervention New dressing;Adhesive placed at insertion site (IV team only) 02/23/23 2230  Dressing Change Due 03/02/23 02/23/23 2230       Myrah Strawderman, Lajean Manes 02/23/2023, 10:42 PM

## 2023-02-23 NOTE — Progress Notes (Signed)
Pharmacy: Antimicrobial Stewardship Note  18 YOM with staph simulans lumbar wound and consideration of IV antibiotics for 6 weeks.  Per discussion on ID rounds - Daptomycin would be preferred due to once a day dosing, better side effect profile, and less monitoring concerns (levels, renal fxn).   Noted plans for the patient to d/c to SNF - social worker checking with SNF on if the patient can be discharged to them on Daptomycin.  Recommending Daptomycin 800 mg IV every 24 hours thru ~03/29/23  Thank you for allowing pharmacy to be a part of this patient's care.  Georgina Pillion, PharmD, BCPS, BCIDP Infectious Diseases Clinical Pharmacist 02/23/2023 12:50 PM   **Pharmacist phone directory can now be found on [email or web address] ([email or web address]).  Listed under [facility].

## 2023-02-23 NOTE — Progress Notes (Signed)
  NEUROSURGERY PROGRESS NOTE   No issues overnight. Reports "pressure" in his back while walking, otherwise doing well.  EXAM:  BP (!) 118/56 (BP Location: Left Arm)   Pulse 87   Temp 99.3 F (37.4 C) (Oral)   Resp 17   Ht 5\' 5"  (1.651 m)   Wt (!) 158 kg   SpO2 94%   BMI 57.96 kg/m   Awake, alert, oriented  Speech fluent, appropriate  CN grossly intact  5/5 BUE/BLE  Wound c/d/I  IMPRESSION:  53 y.o. male s/p revision of lumbar hardware, progressing slowly. Coag neg staph from culture.  PLAN: - Pending SNF auth - Appreciate ID recs, plan on dapto 800mg  QDay for another ~5weeks   Lisbeth Renshaw, MD [facility]

## 2023-02-23 NOTE — Plan of Care (Signed)
Problem: Health Behavior/Discharge Planning: Goal: Ability to manage health-related needs will improve Outcome: Progressing   Problem: Clinical Measurements: Goal: Ability to maintain clinical measurements within normal limits will improve Outcome: Progressing Goal: Will remain free from infection Outcome: Progressing

## 2023-02-24 DIAGNOSIS — I10 Essential (primary) hypertension: Secondary | ICD-10-CM | POA: Insufficient documentation

## 2023-02-24 DIAGNOSIS — K219 Gastro-esophageal reflux disease without esophagitis: Secondary | ICD-10-CM | POA: Insufficient documentation

## 2023-02-24 DIAGNOSIS — E1141 Type 2 diabetes mellitus with diabetic mononeuropathy: Secondary | ICD-10-CM | POA: Insufficient documentation

## 2023-02-24 DIAGNOSIS — F329 Major depressive disorder, single episode, unspecified: Secondary | ICD-10-CM | POA: Insufficient documentation

## 2023-02-24 LAB — GLUCOSE, CAPILLARY
Glucose-Capillary: 116 mg/dL — ABNORMAL HIGH (ref 70–99)
Glucose-Capillary: 98 mg/dL (ref 70–99)
Glucose-Capillary: 104 mg/dL — ABNORMAL HIGH (ref 70–99)
Glucose-Capillary: 110 mg/dL — ABNORMAL HIGH (ref 70–99)
Glucose-Capillary: 132 mg/dL — ABNORMAL HIGH (ref 70–99)

## 2023-02-24 MED ORDER — ROSUVASTATIN CALCIUM 20 MG PO TABS: 20.0000 mg | ORAL_TABLET | Freq: Every day | ORAL | Status: AC

## 2023-02-24 MED ORDER — DAPTOMYCIN IV (FOR PTA / DISCHARGE USE ONLY)
800.0000 mg | INTRAVENOUS | 0 refills | Status: AC
Start: 2023-02-24 — End: 2023-03-31

## 2023-02-24 MED ORDER — HEPARIN SODIUM (PORCINE) 5000 UNIT/ML IJ SOLN: 5000.0000 [IU] | Freq: Three times a day (TID) | INTRAMUSCULAR | 0 refills | Status: DC

## 2023-02-24 MED ORDER — OXYCODONE HCL 5 MG PO TABS: 10.0000 mg | ORAL_TABLET | Freq: Four times a day (QID) | ORAL | 0 refills | Status: AC | PRN

## 2023-02-24 NOTE — Progress Notes (Signed)
Physical Therapy Treatment Patient Details Name: John Cook MRN: [id number] DOB: [birth year] Today's Date: 02/24/2023   History of Present Illness 53 yo male with worsening back pain with movement over the past few weeks. Transferred from [facility] after CT shows cage migration in the disc space since his L4-L5 PLIF 12/29/22. 02/13/23 removal of pedical screws and rods and placement of Posterior non-segmental instrumentation, L4-5.  PMH Lumbar fusion L4/5 on 12/29/23.with multiple falls postop, asthma, DM, GERD, HLD, HTN, obesity.    PT Comments  Pt seen for PT tx with pt agreeable despite c/o 7/10 back pain. Pt able to don LSO with pt reporting it helps with pain management. Pt declines exercises but agreeable to gait with pt ambulating 2 laps around nurses station with RW & supervision, occasional standing rest break 2/2 feeling "hot". Pt elected to don LSO at end of session. Pt left sitting EOB with lunch tray set up.    If plan is discharge home, recommend the following: A little help with walking and/or transfers;Two people to help with bathing/dressing/bathroom;Assistance with cooking/housework;Direct supervision/assist for medications management;Direct supervision/assist for financial management;Assist for transportation;Help with stairs or ramp for entrance;A little help with bathing/dressing/bathroom   Can travel by private vehicle     Yes  Equipment Recommendations  Other (comment);None recommended by PT (defer to next venue)    Recommendations for Other Services       Precautions / Restrictions Precautions Precautions: Back;Fall Precaution/Restrictions Comments: verbally reviewed, watch BP Required Braces or Orthoses: Spinal Brace Spinal Brace: Lumbar corset Restrictions Weight Bearing Restrictions Per Provider Order: No     Mobility  Bed Mobility               General bed mobility comments: not tested, pt received & left sitting EOB    Transfers Overall  transfer level: Needs assistance Equipment used: Rolling walker (2 wheels) Transfers: Sit to/from Stand Sit to Stand: Supervision           General transfer comment: STS from EOB with RW    Ambulation/Gait Ambulation/Gait assistance: Supervision Gait Distance (Feet):  (>200 ft) Assistive device: Rolling walker (2 wheels) Gait Pattern/deviations: Step-through pattern, Decreased stride length, Decreased step length - right, Decreased step length - left       General Gait Details: PT educates pt on need to lower height of walker & to ambulate within base of RW vs pushing it out slightly in front but pt politely declines, reporting he prefers RW at height it is. Pt with improving ability to ambulate closer to RW vs pushing it in front of him. 1-2 standing rest breaks 2/2 fatigue.   Stairs             Wheelchair Mobility     Tilt Bed    Modified Rankin (Stroke Patients Only)       Balance Overall balance assessment: Needs assistance Sitting-balance support: Feet supported, No upper extremity supported Sitting balance-Leahy Scale: Good     Standing balance support: Bilateral upper extremity supported, During functional activity, Reliant on assistive device for balance Standing balance-Leahy Scale: Fair Standing balance comment: Pt bumped wall with RW & pt able to maintain balance, no LOB.                            Communication    Cognition Arousal: Alert Behavior During Therapy: WFL for tasks assessed/performed   PT - Cognitive impairments: No apparent impairments  Cueing    Exercises      General Comments General comments (skin integrity, edema, etc.): Pt able to don/doff LSO sitting EOB without assistance. Pt electing to leave LSO off once sitting EOB at end of session despite PT educating pt on need to have it donned & pt reporting it helps with pain management.      Pertinent Vitals/Pain Pain  Assessment Pain Assessment: 0-10 Pain Score: 7  Pain Location: back Pain Descriptors / Indicators: Discomfort, Guarding, Grimacing, Sore Pain Intervention(s): Monitored during session, Limited activity within patient's tolerance    Home Living                          Prior Function            PT Goals (current goals can now be found in the care plan section) Acute Rehab PT Goals PT Goal Formulation: With patient Time For Goal Achievement: 03/03/23 Potential to Achieve Goals: Good Progress towards PT goals: Progressing toward goals    Frequency    Min 1X/week      PT Plan      Co-evaluation              AM-PAC PT "6 Clicks" Mobility   Outcome Measure  Help needed turning from your back to your side while in a flat bed without using bedrails?: A Little Help needed moving from lying on your back to sitting on the side of a flat bed without using bedrails?: A Lot Help needed moving to and from a bed to a chair (including a wheelchair)?: A Little Help needed standing up from a chair using your arms (e.g., wheelchair or bedside chair)?: A Little Help needed to walk in hospital room?: A Little Help needed climbing 3-5 steps with a railing? : A Little 6 Click Score: 17    End of Session Equipment Utilized During Treatment: Back brace Activity Tolerance: Patient tolerated treatment well Patient left: in bed;with call bell/phone within reach (sitting EOB set up with meal tray) Nurse Communication: Mobility status PT Visit Diagnosis: Other abnormalities of gait and mobility (R26.89);Pain Pain - part of body:  (back)     Time: 1202-1214 PT Time Calculation (min) (ACUTE ONLY): 12 min  Charges:    $Therapeutic Activity: 8-22 mins PT General Charges $$ ACUTE PT VISIT: 1 Visit                     Aleda Grana, PT, DPT 02/24/23, 12:40 PM   Sandi Mariscal 02/24/2023, 12:39 PM

## 2023-02-24 NOTE — Discharge Summary (Signed)
Physician Discharge Summary  Patient ID: John Cook MRN: [id number] DOB/AGE: 1970-09-05 53 y.o.  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 male admitted after elective revision of L4-5 fusion.  Briefly, the patient underwent initial L4-5 decompression and fusion about 3 months ago.  Patient appeared to be doing reasonably well a few weeks after surgery but began complaining of severely worsening back pain and multiple falls.  His imaging did reveal loosening of the pedicle screws and migration of the cages.  He was therefore brought in for elective revision of hardware.  Patient underwent revision of the L4-5 fusion without complication.  Initial intraoperative culture did ultimately grow coagulase-negative staph.  Patient was started on vancomycin in the hospital.  He was working with physical and Occupational Therapy, with modest slow improvement in his mobility.  Unfortunately he was unable to safely return home.  Skilled nursing facility placement was therefore recommended.  Patient was seen by infectious disease and switched to daptomycin to complete approximately 6-week course.  At the time of discharge patient's back pain was under control with oral medication, he was tolerating IV antibiotics, and ambulating with therapy.  He was tolerating diet and voiding normally.  Treatments: Surgery -revision of L4-5 fusion  Discharge Exam: Blood pressure (!) 116/54, pulse 73, temperature 98.2 F (36.8 C), temperature source Oral, resp. rate 20, height 5\' 5"  (1.651 m), weight (!) 158 kg, SpO2 94%. Awake, alert, oriented Speech fluent, appropriate CN grossly intact 5/5 BUE/BLE Wound c/d/i  Disposition: Discharge disposition: 03-Skilled Nursing  Facility       Discharge Instructions     [facility] pharmacist to adjust dose for Vancomycin, Aminoglycosides and other anti-infective therapies as requested by physician.   Complete by: As directed    [facility] to provide Cath Flo 2mg    Complete by: As directed    Administer for PICC line occlusion and as ordered by physician for other access device issues.   Anaphylaxis Kit: Provided to treat any anaphylactic reaction to the medication being provided to the patient if First Dose or when requested by physician   Complete by: As directed    Epinephrine 1mg /ml vial / amp: Administer 0.3mg  (0.25ml) subcutaneously once for moderate to severe anaphylaxis, nurse to call physician and pharmacy when reaction occurs and call 911 if needed for immediate care   Diphenhydramine 50mg /ml IV vial: Administer 25-50mg  IV/IM PRN for first dose reaction, rash, itching, mild reaction, nurse to call physician and pharmacy when reaction occurs   Sodium Chloride 0.9% NS IV: Administer if needed for hypovolemic blood pressure drop or as ordered by physician after call to physician with anaphylactic reaction   Call MD for:  redness, tenderness, or signs of infection (pain, swelling, redness, odor or green/yellow discharge around incision site)   Complete by: As directed    Call MD for:  temperature >100.4   Complete by: As directed    Change dressing (specify)   Complete by: As directed    Dressing change: one times per day using gauze/tape.   Change dressing on IV access line weekly and PRN   Complete by: As directed    Diet - low sodium heart healthy   Complete by: As directed    Discharge instructions   Complete by:  As directed    Walk at home as much as possible, at least 4 times / day   Flush IV access with Sodium Chloride 0.9% and Heparin 10 units/ml or 100 units/ml   Complete by: As directed    Home infusion instructions - [facility]   Complete by: As  directed    Instructions: Flush IV access with Sodium Chloride 0.9% and Heparin 10units/ml or 100units/ml   Change dressing on IV access line: Weekly and PRN   Instructions Cath Flo 2mg : Administer for PICC Line occlusion and as ordered by physician for other access device   [facility] pharmacist to adjust dose for: Vancomycin, Aminoglycosides and other anti-infective therapies as requested by physician   Increase activity slowly   Complete by: As directed    Lifting restrictions   Complete by: As directed    No lifting > 10 lbs   May shower / Bathe   Complete by: As directed    48 hours after surgery   May walk up steps   Complete by: As directed    Method of administration may be changed at the discretion of home infusion pharmacist based upon assessment of the patient and/or caregiver's ability to self-administer the medication ordered   Complete by: As directed    Other Restrictions   Complete by: As directed    No bending/twisting at waist      Allergies as of 02/24/2023       Reactions   Lisinopril Cough        Medication List     STOP taking these medications    oxyCODONE-acetaminophen 10-325 MG tablet Commonly known as: Percocet       TAKE these medications    acetaminophen 500 MG tablet Commonly known as: TYLENOL Take 500 mg by mouth every 6 (six) hours as needed for mild pain or moderate pain.   atenolol 50 MG tablet Commonly known as: TENORMIN Take 50 mg by mouth daily.   celecoxib 200 MG capsule Commonly known as: CELEBREX Take 200 mg by mouth 2 (two) times daily.   daptomycin IVPB Commonly known as: CUBICIN Inject 800 mg into the vein daily. Indication:  Lumbar wound infection First Dose: Yes Last Day of Therapy:  03/30/23 Labs - Once weekly:  CBC/D, BMP, and CPK Labs - Once weekly: ESR and CRP Method of administration: IV Push Method of administration may be changed at the discretion of home infusion pharmacist based upon  assessment of the patient and/or caregiver's ability to self-administer the medication ordered.   DULoxetine 30 MG capsule Commonly known as: CYMBALTA Take 30 mg by mouth 2 (two) times daily.   fenofibrate 145 MG tablet Commonly known as: TRICOR Take 145 mg by mouth daily.   gabapentin 300 MG capsule Commonly known as: NEURONTIN Take 600 mg by mouth 2 (two) times daily.   heparin 5000 UNIT/ML injection Inject 1 mL (5,000 Units total) into the skin every 8 (eight) hours.   metFORMIN 500 MG tablet Commonly known as: GLUCOPHAGE Take 500 mg by mouth daily.   methocarbamol 500 MG tablet Commonly known as: ROBAXIN Take 1 tablet (500 mg total) by mouth every 6 (six) hours as needed for muscle spasms.   methylPREDNISolone 4 MG Tbpk tablet Commonly known as: MEDROL DOSEPAK Please take the Dosepak as described on package instructions.   omeprazole 20 MG capsule Commonly known as: PRILOSEC Take 20 mg by mouth in the morning and at bedtime.   oxybutynin 5 MG  tablet Commonly known as: DITROPAN Take 5 mg by mouth daily.   oxyCODONE 5 MG immediate release tablet Commonly known as: Oxy IR/ROXICODONE Take 2 tablets (10 mg total) by mouth every 6 (six) hours as needed for up to 7 days for severe pain (pain score 7-10).   Proventil HFA 108 (90 Base) MCG/ACT inhaler Generic drug: albuterol Inhale 2 puffs into the lungs every 4 (four) hours as needed for shortness of breath or wheezing.   rosuvastatin 20 MG tablet Commonly known as: CRESTOR Take 1 tablet (20 mg total) by mouth at bedtime. Hold while on Daptomycin - can resume on 03/31/23 Start taking on: March 31, 2023 What changed:  additional instructions These instructions start on March 31, 2023. If you are unsure what to do until then, ask your doctor or other care provider.   triamterene-hydrochlorothiazide 37.5-25 MG tablet Commonly known as: MAXZIDE-25 Take 1 tablet by mouth daily.               Discharge Care  Instructions  (From admission, onward)           Start     Ordered   02/24/23 0000  Change dressing on IV access line weekly and PRN  (Home infusion instructions - [facility] )        02/24/23 1134   02/24/23 0000  Change dressing (specify)       Comments: Dressing change: one times per day using gauze/tape.   02/24/23 1134            Follow-up Information     Aging, Disabilty and Transit Follow up.   Why: Another resource for transportation Contact information: [phone]        Lisbeth Renshaw, MD Follow up in 3 week(s).   Specialty: Neurosurgery Contact information: [address] [place] [address] [phone]                 Signed: Jackelyn Hoehn 02/24/2023, 11:35 AM

## 2023-02-24 NOTE — Plan of Care (Signed)
Problem: Health Behavior/Discharge Planning: Goal: Ability to manage health-related needs will improve Outcome: Progressing   Problem: Clinical Measurements: Goal: Ability to maintain clinical measurements within normal limits will improve Outcome: Progressing Goal: Will remain free from infection Outcome: Progressing Goal: Diagnostic test results will improve Outcome: Progressing

## 2023-02-24 NOTE — Progress Notes (Signed)
Report given to [employer], Visual merchandiser

## 2023-02-24 NOTE — TOC Transition Note (Signed)
Transition of Care [facility]) - Discharge Note   Patient Details  Name: BODEE LAFOE MRN: [id number] Date of Birth: 28-Jan-1970  [facility] of [facility]) CM/SW Contact:  Jaelie Aguilera Felipa [employer], Student-Social Work Phone Number: 02/24/2023, 3:28 PM   Clinical Narrative:   MSW Student received insurance approval for patient to discharge to [employer] [facility]. MSW Student confirmed with MD that patient stable for discharge. MSW Student notified patient and they are in agreement with discharge. MSW Student confirmed bed is available at SNF. Transport arranged with [facility] for next available.   Number to call report: [phone]    Final next level of care: Skilled Nursing Facility Barriers to Discharge: Barriers Resolved   Patient Goals and CMS Choice            Discharge Placement              Patient [facility] bed at: [facility] Patient to be transferred to facility by: [facility] Name of family member notified: Self Patient and family notified of of transfer: 02/24/23  Discharge Plan and Services Additional resources added to the After Visit Summary for     Discharge Planning Services: CM Consult                                 Social Drivers of Health (SDOH) Interventions SDOH Screenings   Food Insecurity: Food Insecurity Present (02/09/2023)  Housing: High Risk (02/09/2023)  Transportation Needs: Unmet Transportation Needs (02/09/2023)  Utilities: Not At Risk (02/09/2023)  Tobacco Use: Medium Risk (02/13/2023)     Readmission Risk Interventions     No data to display

## 2023-03-01 LAB — MINIMUM INHIBITORY CONC. (1 DRUG)

## 2023-03-01 LAB — AEROBIC/ANAEROBIC CULTURE W GRAM STAIN (SURGICAL/DEEP WOUND): Gram Stain: NONE SEEN

## 2023-03-01 LAB — MIC RESULT

## 2023-03-06 ENCOUNTER — Encounter: Payer: Self-pay | Admitting: Neurosurgery

## 2023-03-13 ENCOUNTER — Telehealth: Payer: Self-pay

## 2023-03-13 NOTE — Telephone Encounter (Signed)
 Received the following message from Pam with [employer];  Mr. John Cook DC'd home from [facility] in [place] on Friday.  Pt is on Daptomycin for lumbar wound infection through 03/30/23    Juanita Laster, RMA

## 2023-03-31 ENCOUNTER — Other Ambulatory Visit: Payer: Self-pay

## 2023-03-31 ENCOUNTER — Ambulatory Visit: Payer: Self-pay | Admitting: Internal Medicine

## 2023-03-31 ENCOUNTER — Telehealth: Payer: Self-pay

## 2023-03-31 VITALS — BP 160/93 | HR 97 | Temp 97.6°F | Wt 314.0 lb

## 2023-03-31 DIAGNOSIS — M4626 Osteomyelitis of vertebra, lumbar region: Secondary | ICD-10-CM

## 2023-03-31 DIAGNOSIS — Z981 Arthrodesis status: Secondary | ICD-10-CM

## 2023-03-31 DIAGNOSIS — T8463XA Infection and inflammatory reaction due to internal fixation device of spine, initial encounter: Secondary | ICD-10-CM

## 2023-03-31 MED ORDER — DOXYCYCLINE HYCLATE 100 MG PO CAPS: 100.0000 mg | ORAL_CAPSULE | Freq: Two times a day (BID) | ORAL | 1 refills | Status: DC

## 2023-03-31 NOTE — Progress Notes (Signed)
 Patient ID: John Cook, male   DOB: [birth year], 53 y.o.   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 He states that it was healing well, no drainage to incision. He started to have intractable back pain after 2-3 wks post surgery. Repeat imaging showed that there was loosening of screws and cage migration. Dr Conchita Paris took patient back to the OR on 2/3 for revision. There was small amount of fluid in the subfascial space that swab culture was sent. Removal of old screws and new instrumentation was placed. 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 The patient's pain is improving. ID asked to weigh in for abtx management. Recent labs showing increase in creatinine while on 6 days of vancomycin . He was switched to daptomycin to end on march 20th. Labs from 3/17 show cr 1.07, wbc of 10.8, hgb 12.3, plt 214, ck 109, crp 14        Staphylococcus simulans      MIC    CIPROFLOXACIN <=0.5 SENSI... Sensitive    CLINDAMYCIN <=0.25 SENS... Sensitive    ERYTHROMYCIN <=0.25 SENS... Sensitive    GENTAMICIN <=0.5 SENSI... Sensitive    Inducible Clindamycin NEGATIVE Sensitive    OXACILLIN >=4 RESISTANT Resistant    RIFAMPIN <=0.5 SENSI... Sensitive    TETRACYCLINE <=1 SENSITIVE Sensitive    TRIMETH/SULFA <=10 SENSIT... Sensitive    VANCOMYCIN <=0.5 SENSI... Sensitive     Outpatient Encounter Medications as of 03/31/2023  Medication Sig   acetaminophen (TYLENOL) 500 MG tablet Take 500 mg by mouth every 6 (six) hours as needed for mild pain or moderate pain.   atenolol (TENORMIN) 50 MG tablet Take 50 mg by mouth daily.   celecoxib (CELEBREX) 200 MG capsule Take 200 mg by mouth 2 (two) times daily.   daptomycin (CUBICIN) IVPB Inject 800 mg into the vein daily. Indication:  Lumbar wound infection First Dose: Yes Last Day of Therapy:  03/30/23 Labs - Once weekly:  CBC/D, BMP,  and CPK Labs - Once weekly: ESR and CRP Method of administration: IV Push Method of administration may be changed at the discretion of home infusion pharmacist based upon assessment of the patient and/or caregiver's ability to self-administer the medication ordered.   DULoxetine (CYMBALTA) 30 MG capsule Take 30 mg by mouth 2 (two) times daily.   fenofibrate (TRICOR) 145 MG tablet Take 145 mg by mouth daily.   gabapentin (NEURONTIN) 300 MG capsule Take 600 mg by mouth 2 (two) times daily.   heparin 5000 UNIT/ML injection Inject 1 mL (5,000 Units total) into the skin every 8 (eight) hours.   metFORMIN (GLUCOPHAGE) 500 MG tablet Take 500 mg by mouth daily.   methocarbamol (ROBAXIN) 500 MG tablet Take 1 tablet (500 mg total) by mouth every 6 (six) hours as needed for muscle spasms.   methylPREDNISolone (MEDROL DOSEPAK) 4 MG TBPK tablet Please take the Dosepak as described on package instructions.   omeprazole (PRILOSEC) 20 MG capsule Take 20 mg by mouth in the morning and at bedtime.   oxybutynin (DITROPAN) 5 MG tablet Take 5 mg by mouth daily.   PROVENTIL HFA 108 (90 Base) MCG/ACT inhaler Inhale 2 puffs into the lungs every 4 (four) hours as needed for shortness of breath or wheezing.   rosuvastatin (CRESTOR) 20 MG tablet Take 1 tablet (20 mg total) by mouth at bedtime. Hold while  on Daptomycin - can resume on 03/31/23   triamterene-hydrochlorothiazide (MAXZIDE-25) 37.5-25 MG tablet Take 1 tablet by mouth daily.   No facility-administered encounter medications on file as of 03/31/2023.     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 12 point ros is otherwise negative Physical Exam   BP (!) 160/93   Pulse 97   Temp 97.6 F (36.4 C) (Oral)   Wt (!) 314 lb (142.4 kg)   SpO2 94%   BMI 52.25 kg/m    Physical Exam  Constitutional: He is oriented to person, place, and time. He appears well-developed and well-nourished. No distress.  HENT:  Mouth/Throat: Oropharynx is clear and moist. No oropharyngeal exudate.  Cardiovascular: Normal rate, regular rhythm and normal heart sounds. Exam reveals no gallop and no friction rub.  No murmur heard.  Pulmonary/Chest: Effort normal and breath sounds normal. No respiratory distress. He has no wheezes.  Abdominal: Soft. Bowel sounds are normal. He exhibits no distension. There is no tenderness.  Lymphadenopathy:  He has no cervical adenopathy.  Neurological: He is alert and oriented to person, place, and time.  Skin: Skin is warm and dry. No rash noted. No erythema.  Psychiatric: He has a normal mood and affect. His behavior is normal.    CBC Lab Results  Component Value Date   WBC 11.4 (H) 02/23/2023   RBC 4.20 (L) 02/23/2023   HGB 11.6 (L) 02/23/2023   HCT 35.6 (L) 02/23/2023   PLT 332 02/23/2023   MCV 84.8 02/23/2023   MCH 27.6 02/23/2023   MCHC 32.6 02/23/2023   RDW 14.1 02/23/2023   LYMPHSABS 3.1 02/23/2023   MONOABS 1.1 (H) 02/23/2023   EOSABS 0.7 (H) 02/23/2023    BMET Lab Results  Component Value Date   NA 133 (L) 02/23/2023   K 3.9 02/23/2023   CL 95 (L) 02/23/2023   CO2 26 02/23/2023   GLUCOSE 107 (H) 02/23/2023   BUN 16 02/23/2023   CREATININE 1.39 (H) 02/23/2023   CALCIUM 9.3 02/23/2023   GFRNONAA >60 02/23/2023   GFRAA >60 10/12/2017      Assessment and  Plan Spinal hw infection = Reviewed labs, doing better Will do 4 more weeks of oral doxy 100mg  po bid Gave precautions Will need to contact home health to pull picc line

## 2023-03-31 NOTE — Telephone Encounter (Signed)
 Per Dr. Drue Second okay for picc line to come out today. Spoke with Ariel who will reach out to [facility] to see if nursing can pull picc today.  Patient aware. Juanita Laster, RMA

## 2023-04-19 ENCOUNTER — Ambulatory Visit: Admitting: Internal Medicine

## 2023-04-19 ENCOUNTER — Other Ambulatory Visit: Payer: Self-pay

## 2023-04-19 DIAGNOSIS — Z981 Arthrodesis status: Secondary | ICD-10-CM | POA: Diagnosis not present

## 2023-04-19 DIAGNOSIS — T8463XD Infection and inflammatory reaction due to internal fixation device of spine, subsequent encounter: Secondary | ICD-10-CM

## 2023-04-19 DIAGNOSIS — B958 Unspecified staphylococcus as the cause of diseases classified elsewhere: Secondary | ICD-10-CM

## 2023-04-19 DIAGNOSIS — M4626 Osteomyelitis of vertebra, lumbar region: Secondary | ICD-10-CM

## 2023-04-19 MED ORDER — DOXYCYCLINE HYCLATE 100 MG PO CAPS
100.0000 mg | ORAL_CAPSULE | Freq: Two times a day (BID) | ORAL | 1 refills | Status: DC
Start: 2023-04-19 — End: 2023-05-10

## 2023-04-19 NOTE — Progress Notes (Signed)
 Patient ID: John Cook, male   DOB: Apr 09, 1970, 54 y.o.   MRN: [id number]  HPI John Cook is a 53 y.o. male with T2Dm, obesity, who recently underwent fusion due to  lumbar spondylsis with radiculopathy. He states that it was healing well, no drainage to incision. He started to have intractable back pain after 2-3 wks post surgery. Repeat imaging showed that there was loosening of screws and cage migration. Dr Conchita Paris took patient back to the OR on 2/3 for revision. There was small amount of fluid in the subfascial space that swab culture was sent. Removal of old screws and new instrumentation was placed. 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 Sed rate of 80. The patient's pain is improving. ID asked to weigh in for abtx management. Recent labs showing increase in creatinine while on 6 days of vancomycin . He was switched to daptomycin to end on march 20th.now on doxycycline for which he is tolerating but still has significant back pain.   Sees dr Conchita Paris next week.  Takes oxycodone for back pain by dr Conchita Paris. Also gets back spasm ; Getting referred to pain management; doesn't feel that pain any better since back surgery  Was previously managed at health dept for 5 years until he received medicaid --- now getting established with pcp.   Incapacitated. Difficult to ambulate, still using walker. 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   atenolol (TENORMIN) 50 MG tablet Take 50 mg by mouth daily.   celecoxib (CELEBREX) 200 MG capsule Take 200 mg by mouth 2 (two) times daily.   doxycycline (VIBRAMYCIN) 100 MG capsule Take 1 capsule (100 mg total) by mouth 2 (two) times daily. Take on full stomach   DULoxetine (CYMBALTA) 30 MG capsule Take 30 mg by  mouth 2 (two) times daily.   fenofibrate (TRICOR) 145 MG tablet Take 145 mg by mouth daily.   gabapentin (NEURONTIN) 300 MG capsule Take 600 mg by mouth 2 (two) times daily.   metFORMIN (GLUCOPHAGE) 500 MG tablet Take 500 mg by mouth daily.   methocarbamol (ROBAXIN) 500 MG tablet Take 1 tablet (500 mg total) by mouth every 6 (six) hours as needed for muscle spasms.   omeprazole (PRILOSEC) 20 MG capsule Take 20 mg by mouth in the morning and at bedtime.   oxybutynin (DITROPAN) 5 MG tablet Take 5 mg by mouth daily.   oxyCODONE-acetaminophen (PERCOCET) 10-325 MG tablet 1 tablet as needed Orally every 6 hrs   promethazine (PHENERGAN) 25 MG tablet Take 25 mg by mouth every 6 (six) hours as needed.   PROVENTIL HFA 108 (90 Base) MCG/ACT inhaler Inhale 2 puffs into the lungs every 4 (four) hours as needed for shortness of breath or wheezing.   rosuvastatin (CRESTOR) 20 MG tablet Take 1 tablet (20 mg total) by mouth at bedtime. Hold while on Daptomycin - can resume on 03/31/23   triamterene-hydrochlorothiazide (MAXZIDE-25) 37.5-25 MG tablet Take 1 tablet by mouth daily.   [DISCONTINUED] heparin 5000 UNIT/ML injection Inject 1 mL (5,000 Units total) into the skin every 8 (eight) hours. (Patient not taking: Reported on 04/19/2023)   [DISCONTINUED] methylPREDNISolone (MEDROL DOSEPAK) 4 MG TBPK tablet Please take the Dosepak as described on package instructions. (Patient not taking:  Reported on 04/19/2023)   No facility-administered encounter medications on file as of 04/19/2023.     Patient Active Problem List   Diagnosis Date Noted   Other spondylosis with radiculopathy, lumbar region 02/09/2023   Synovial cyst 12/29/2022   SOB (shortness of breath) 11/17/2017   Excessive daytime sleepiness 11/17/2017   History of snoring 11/17/2017   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     Health Maintenance Due  Topic Date Due   Pneumococcal Vaccine 58-53 Years old (1 of 2 - PCV) Never done   Hepatitis C Screening   Never done   Colonoscopy  Never done   Zoster Vaccines- Shingrix (1 of 2) Never done   COVID-19 Vaccine (1 - 2024-25 season) Never done     Review of Systems +back pain Physical Exam   There were no vitals taken for this visit. Physical Exam  Constitutional: He is oriented to person, place, and time. He appears well-developed and well-nourished. No distress.  HENT:  Mouth/Throat: Oropharynx is clear and moist. No oropharyngeal exudate.  Cardiovascular: Normal rate, regular rhythm and normal heart sounds. Exam reveals no gallop and no friction rub.  No murmur heard.  Pulmonary/Chest: Effort normal and breath sounds normal. No respiratory distress. He has no wheezes.  Abdominal: Soft. Bowel sounds are normal. He exhibits no distension. There is no tenderness.  Lymphadenopathy:  He has no cervical adenopathy.  Neurological: He is alert and oriented to person, place, and time.  Skin: Skin is warm and dry. No rash noted. No erythema.  Psychiatric: He has a normal mood and affect. His behavior is normal.   CBC Lab Results  Component Value Date   WBC 11.4 (H) 02/23/2023   RBC 4.20 (L) 02/23/2023   HGB 11.6 (L) 02/23/2023   HCT 35.6 (L) 02/23/2023   PLT 332 02/23/2023   MCV 84.8 02/23/2023   MCH 27.6 02/23/2023   MCHC 32.6 02/23/2023   RDW 14.1 02/23/2023   LYMPHSABS 3.1 02/23/2023   MONOABS 1.1 (H) 02/23/2023   EOSABS 0.7 (H) 02/23/2023    BMET Lab Results  Component Value Date   NA 133 (L) 02/23/2023   K 3.9 02/23/2023   CL 95 (L) 02/23/2023   CO2 26 02/23/2023   GLUCOSE 107 (H) 02/23/2023   BUN 16 02/23/2023   CREATININE 1.39 (H) 02/23/2023   CALCIUM 9.3 02/23/2023   GFRNONAA >60 02/23/2023   GFRAA >60 10/12/2017   Lab Results  Component Value Date   ESRSEDRATE 80 (H) 02/23/2023    Assessment and Plan Lumbar fusion HW infection with staph simulans = plan to check sed rate and crp  Long term medication = will check cr to see he is stable   But anticipate to  continue on doxcycline for addn 2 months +.  I have personally spent 30 minutes involved in face-to-face and non-face-to-face activities for this patient on the day of the visit. Professional time spent includes the following activities: Preparing to see the patient (review of tests), Obtaining and/or reviewing separately obtained history (admission/discharge record), Performing a medically appropriate examination and/or evaluation , Ordering medications/tests/procedures, referring and communicating with other health care professionals, Documenting clinical information in the EMR, Independently interpreting results (not separately reported), Communicating results to the patient/family/caregiver, Counseling and educating the patient/family/caregiver and Care coordination (not separately reported).

## 2023-04-20 LAB — CBC WITH DIFFERENTIAL/PLATELET
Absolute Lymphocytes: 2141 {cells}/uL (ref 850–3900)
Absolute Monocytes: 647 {cells}/uL (ref 200–950)
Basophils Absolute: 54 {cells}/uL (ref 0–200)
Basophils Relative: 0.7 %
Eosinophils Absolute: 208 {cells}/uL (ref 15–500)
Eosinophils Relative: 2.7 %
HCT: 43.8 % (ref 38.5–50.0)
Hemoglobin: 13.9 g/dL (ref 13.2–17.1)
MCH: 27.6 pg (ref 27.0–33.0)
MCHC: 31.7 g/dL — ABNORMAL LOW (ref 32.0–36.0)
MCV: 87.1 fL (ref 80.0–100.0)
MPV: 11 fL (ref 7.5–12.5)
Monocytes Relative: 8.4 %
Neutro Abs: 4651 {cells}/uL (ref 1500–7800)
Neutrophils Relative %: 60.4 %
Platelets: 272 10*3/uL (ref 140–400)
RBC: 5.03 10*6/uL (ref 4.20–5.80)
RDW: 15.5 % — ABNORMAL HIGH (ref 11.0–15.0)
Total Lymphocyte: 27.8 %
WBC: 7.7 10*3/uL (ref 3.8–10.8)

## 2023-04-20 LAB — BASIC METABOLIC PANEL WITH GFR
BUN: 22 mg/dL (ref 7–25)
CO2: 25 mmol/L (ref 20–32)
Calcium: 10 mg/dL (ref 8.6–10.3)
Chloride: 99 mmol/L (ref 98–110)
Creat: 1.02 mg/dL (ref 0.70–1.30)
Glucose, Bld: 90 mg/dL (ref 65–99)
Potassium: 4 mmol/L (ref 3.5–5.3)
Sodium: 136 mmol/L (ref 135–146)
eGFR: 88 mL/min/{1.73_m2} (ref >=60)

## 2023-04-20 LAB — C-REACTIVE PROTEIN: CRP: 4.6 mg/L (ref <8.0)

## 2023-04-20 LAB — SEDIMENTATION RATE: Sed Rate: 17 mm/h (ref 0–20)

## 2023-05-09 DIAGNOSIS — M4316 Spondylolisthesis, lumbar region: Secondary | ICD-10-CM | POA: Insufficient documentation

## 2023-05-09 NOTE — Progress Notes (Unsigned)
 John Cook, male    DOB: [birth year]    MRN: [id number]   Brief patient profile:  63  yowm  quit smoking 1997 s symptoms at wt  around 250   referred to pulmonary clinic in [place]  05/10/2023 by [facility]  for doe x 2015    OSA  moderate dx by Gaynell Keeler last seen 2019  @ wt 367 rec pfts but not done  - could not afford cpap but has done better noct with wt loss since then    History of Present Illness  05/10/2023  Pulmonary/ 1st office eval/ Nyair Depaulo / [place] [facility] @  314 lb  Chief Complaint  Patient presents with   Establish Care   Shortness of Breath  Dyspnea:  walks [place] park 3 x weekly x 30 min total using rollator, stops to rest due to back pain  Cough: none  Sleep: bed blocks/ one pillow/ could not afford cpap machine  SABA use: prn  last time a  one month ago  02: none     No obvious day to day or daytime pattern/variability or assoc excess/ purulent sputum or mucus plugs or hemoptysis or cp or chest tightness, subjective wheeze or overt sinus or hb symptoms.    Also denies any obvious fluctuation of symptoms with weather or environmental changes or other aggravating or alleviating factors except as outlined above   No unusual exposure hx or h/o childhood pna/ asthma or knowledge of premature birth.  Current Allergies, Complete Past Medical History, Past Surgical History, Family History, and Social History were reviewed in [employer] record.  ROS  The following are not active complaints unless bolded Hoarseness, sore throat, dysphagia, dental problems, itching, sneezing,  nasal congestion or discharge of excess mucus or purulent secretions, ear ache,   fever, chills, sweats, unintended wt loss or wt gain, classically pleuritic or exertional cp,  orthopnea pnd or arm/hand swelling  or leg swelling, presyncope, palpitations, abdominal pain, anorexia, nausea, vomiting, diarrhea  or change in bowel habits or change in bladder habits, change in stools  or change in urine, dysuria, hematuria,  rash, arthralgias/back pain, visual complaints, headache, numbness, weakness or ataxia or problems with walking or coordination,  change in mood or  memory.            Outpatient Medications Prior to Visit  Medication Sig Dispense Refill   acetaminophen  (TYLENOL ) 500 MG tablet Take 500 mg by mouth every 6 (six) hours as needed for mild pain or moderate pain.     atenolol  (TENORMIN ) 50 MG tablet Take 50 mg by mouth daily.     celecoxib (CELEBREX) 200 MG capsule Take 200 mg by mouth 2 (two) times daily.     DULoxetine  (CYMBALTA ) 30 MG capsule Take 30 mg by mouth 2 (two) times daily.     fenofibrate  (TRICOR ) 145 MG tablet Take 145 mg by mouth daily.     gabapentin  (NEURONTIN ) 300 MG capsule Take 600 mg by mouth 2 (two) times daily.     metFORMIN  (GLUCOPHAGE ) 500 MG tablet Take 500 mg by mouth daily.     omeprazole (PRILOSEC) 20 MG capsule Take 20 mg by mouth in the morning and at bedtime.     oxybutynin  (DITROPAN ) 5 MG tablet Take 5 mg by mouth daily.     oxyCODONE -acetaminophen  (PERCOCET) 10-325 MG tablet 1 tablet as needed Orally every 6 hrs     promethazine  (PHENERGAN ) 25 MG tablet Take 25 mg by mouth every  6 (six) hours as needed.     PROVENTIL  HFA 108 (90 Base) MCG/ACT inhaler Inhale 2 puffs into the lungs every 4 (four) hours as needed for shortness of breath or wheezing.  0   rosuvastatin  (CRESTOR ) 20 MG tablet Take 1 tablet (20 mg total) by mouth at bedtime. Hold while on Daptomycin  - can resume on 03/31/23     triamterene -hydrochlorothiazide  (MAXZIDE -25) 37.5-25 MG tablet Take 1 tablet by mouth daily.     doxycycline  (VIBRAMYCIN ) 100 MG capsule Take 1 capsule (100 mg total) by mouth 2 (two) times daily. Take on full stomach 60 capsule 1   methocarbamol  (ROBAXIN ) 500 MG tablet Take 1 tablet (500 mg total) by mouth every 6 (six) hours as needed for muscle spasms. 60 tablet 0   No facility-administered medications prior to visit.    Past Medical  History:  Diagnosis Date   Arthritis    Asthma    Diabetes mellitus without complication (HCC)    GERD (gastroesophageal reflux disease)    History of hiatal hernia    Hyperlipidemia    Hypertension    Mixed hyperlipidemia    Neuropathy    Prediabetes    Sleep apnea       Objective:     BP (!) 146/80   Pulse 76   Ht 5\' 5"  (1.651 m)   Wt (!) 314 lb (142.4 kg)   SpO2 94%   BMI 52.25 kg/m   SpO2: 94 % RA    Pleasant amb MO(by bmi) wm nad walks with rollator   HEENT : Oropharynx  clear      Nasal turbinates nl    NECK :  without  apparent JVD/ palpable Nodes/TM    LUNGS: no acc muscle use,  Nl contour chest which is clear to A and P bilaterally without cough on insp or exp maneuvers   CV:  RRR  no s3 or murmur or increase in P2, and no edema   ABD:  massively obese/soft and nontender   MS:  ext warm without deformities Or obvious joint restrictions  calf tenderness, cyanosis or clubbing    SKIN: warm and dry without lesions    NEURO:  alert, approp, nl sensorium with  no motor or cerebellar deficits apparent.    CXR PA and Lateral:   05/10/2023 :    I personally reviewed images and impression is as follows:     Slt reduced lung volumes/ mild CM/ no infiltrates       Assessment   DOE (dyspnea on exertion) Onset 2015 on a background of progessive obesity p stopped smoking 1997 at wt around 250 lb  -   LHC 10/2017 Onset 2015 on a background of progessive obesity p stopped smoking 1997 at wt around 250 lb  -   LHC 10/2017  LVEDP 20 with nl  EF c/w diastolic dysfunction  -  05/10/2023   Walked on RA  x  3  lap(s) =  approx 450  ft  @ slow/rollator pace, stopped due to end of study with lowest 02 sats 96% and mild sob at end  - 05/10/2023 req pfts from [facility]  with nl  EF c/w diastolic dysfunction  -  05/10/2023   Walked on RA  x  3  lap(s) =  approx 450  ft  @ slow/rollator pace, stopped due to end of study with lowest 02 sats 96% and mild sob at end  -  05/10/2023 req pfts from [place]  When respiratory symptoms begin or become refractory well after a patient reports complete smoking cessation,  Especially when this wasn't the case while they were smoking, a red flag is raised based on the work of Dr Bland Bunnell which states:  if you quit smoking when your best day FEV1 is still well preserved it is highly unlikely you will progress to severe disease.  That is to say, once the smoking stops,  the symptoms should not suddenly erupt or markedly worsen.  If so, the differential diagnosis should include  obesity/deconditioning,  LPR/Reflux/Aspiration syndromes,  occult CHF(esp diastolic dysfunction ) , or  especially side effect of medications commonly used in this population.    Bolded dx's most likely but needs to complete wth w/u with full pfts on return in 3 m, call sooner if needed   Morbid (severe) obesity due to excess calories (HCC) Body mass index is 52.25 kg/m.    No results found for: "TSH"    Contributing to doe and risk of GERD/dvt/pe/ osa/ worse diastolic dysfunction  >>>   reviewed the need and the process to achieve and maintain neg calorie balance > defer f/u primary care including intermittently monitoring thyroid status     Each maintenance medication was reviewed in detail including emphasizing most importantly the difference between maintenance and prns and under what circumstances the prns are to be triggered using an action plan format where appropriate.  Total time for H and P, chart review, counseling, reviewing hfa device(s) , directly observing portions of ambulatory 02 saturation study/ and generating customized AVS unique to this office visit / same day charting = 46 min                    Vernestine Gondola, MD 05/10/2023

## 2023-05-10 ENCOUNTER — Telehealth: Payer: Self-pay | Admitting: Internal Medicine

## 2023-05-10 ENCOUNTER — Encounter: Payer: Self-pay | Admitting: Internal Medicine

## 2023-05-10 ENCOUNTER — Ambulatory Visit: Admitting: Internal Medicine

## 2023-05-10 ENCOUNTER — Ambulatory Visit (HOSPITAL_COMMUNITY)
Admission: RE | Admit: 2023-05-10 | Discharge: 2023-05-10 | Disposition: A | Discharge status: Home / Self Care | Source: Ambulatory Visit | Attending: Internal Medicine | Admitting: Internal Medicine

## 2023-05-10 VITALS — BP 146/80 | HR 76 | Ht 65.0 in | Wt 314.0 lb

## 2023-05-10 DIAGNOSIS — Z6841 Body Mass Index (BMI) 40.0 and over, adult: Secondary | ICD-10-CM | POA: Diagnosis not present

## 2023-05-10 DIAGNOSIS — R0609 Other forms of dyspnea: Secondary | ICD-10-CM

## 2023-05-10 DIAGNOSIS — Z87891 Personal history of nicotine dependence: Secondary | ICD-10-CM | POA: Diagnosis not present

## 2023-05-10 NOTE — Assessment & Plan Note (Signed)
 Body mass index is 52.25 kg/m.    No results found for: "TSH"    Contributing to doe and risk of GERD/dvt/pe/ osa/ worse diastolic dysfunction  >>>   reviewed the need and the process to achieve and maintain neg calorie balance > defer f/u primary care including intermittently monitoring thyroid status     Each maintenance medication was reviewed in detail including emphasizing most importantly the difference between maintenance and prns and under what circumstances the prns are to be triggered using an action plan format where appropriate.  Total time for H and P, chart review, counseling, reviewing hfa device(s) , directly observing portions of ambulatory 02 saturation study/ and generating customized AVS unique to this office visit / same day charting = 46 min

## 2023-05-10 NOTE — Telephone Encounter (Signed)
 LVM for patient regarding th Thursday 08/03/23 PFT appointment at [facility] time is 9:45 am--1st floor registration desk for 10:00 am study---follow up with Dr. Waymond Hailey is 08/03/23 at 11:30 am---will mail information to patient and requested call back with questions or concerns

## 2023-05-10 NOTE — Assessment & Plan Note (Addendum)
 Onset 2015 on a background of progessive obesity p stopped smoking 1997 at wt around 250 lb  -   LHC 10/2017 Onset 2015 on a background of progessive obesity p stopped smoking 1997 at wt around 250 lb  -   LHC 10/2017  LVEDP 20 with nl  EF c/w diastolic dysfunction  -  05/10/2023   Walked on RA  x  3  lap(s) =  approx 450  ft  @ slow/rollator pace, stopped due to end of study with lowest 02 sats 96% and mild sob at end  - 05/10/2023 req pfts from [place]  with nl  EF c/w diastolic dysfunction  -  05/10/2023   Walked on RA  x  3  lap(s) =  approx 450  ft  @ slow/rollator pace, stopped due to end of study with lowest 02 sats 96% and mild sob at end  - 05/10/2023 req pfts from [place]    When respiratory symptoms begin or become refractory well after a patient reports complete smoking cessation,  Especially when this wasn't the case while they were smoking, a red flag is raised based on the work of Dr Bland Bunnell which states:  if you quit smoking when your best day FEV1 is still well preserved it is highly unlikely you will progress to severe disease.  That is to say, once the smoking stops,  the symptoms should not suddenly erupt or markedly worsen.  If so, the differential diagnosis should include  obesity/deconditioning,  LPR/Reflux/Aspiration syndromes,  occult CHF(esp diastolic dysfunction ) , or  especially side effect of medications commonly used in this population.    Bolded dx's most likely but needs to complete wth w/u with full pfts on return in 3 m, call sooner if needed

## 2023-05-10 NOTE — Patient Instructions (Signed)
 Ok to try albuterol  15 min before an activity (on alternating days)  that you know would usually make you short of breath and see if it makes any difference and if makes none then don't take albuterol  after activity unless you can't catch your breath as this means it's the resting that helps, not the albuterol .  We will request a copy of your lung function tests from [facility]  Please remember to go to the  x-ray department  @  [facility] for your tests - we will call you with the results when they are available     Please schedule a follow up visit in 3 months but call sooner if needed with pfts on return

## 2023-05-16 ENCOUNTER — Encounter: Payer: Self-pay | Admitting: Orthopedic Surgery

## 2023-05-16 ENCOUNTER — Other Ambulatory Visit (INDEPENDENT_AMBULATORY_CARE_PROVIDER_SITE_OTHER): Payer: Self-pay

## 2023-05-16 ENCOUNTER — Ambulatory Visit: Admitting: Orthopedic Surgery

## 2023-05-16 VITALS — BP 149/87 | HR 73 | Ht 65.0 in | Wt 323.0 lb

## 2023-05-16 DIAGNOSIS — G8929 Other chronic pain: Secondary | ICD-10-CM

## 2023-05-16 DIAGNOSIS — M25511 Pain in right shoulder: Secondary | ICD-10-CM | POA: Diagnosis not present

## 2023-05-16 NOTE — Patient Instructions (Signed)

## 2023-05-16 NOTE — Progress Notes (Signed)
 New Patient Visit  Assessment: John Cook is a 53 y.o. male with the following: 1. Chronic right shoulder pain  Plan: ERIL TIMPSON has pain in the right shoulder.  He has good range of motion, as well as good strength.  Radiographs demonstrate some early degenerative changes.  This could be contributing to some tendinitis.  He states that NSAIDs have been effective for him.  I have offered him a steroid injection, and he has elected to proceed.  This was completed in clinic today without issues.  He will follow-up as needed.  Procedure note injection - Right shoulder    Verbal consent was obtained to inject the right shoulder, subacromial space Timeout was completed to confirm the site of injection.   The skin was prepped with alcohol and ethyl chloride was sprayed at the injection site.  A 21-gauge needle was used to inject 40 mg of Depo-Medrol  and 1% lidocaine  (4 cc) into the subacromial space of the right shoulder using a posterolateral approach.  There were no complications.  A sterile bandage was applied.    Follow-up: Return if symptoms worsen or fail to improve.  Subjective:  Chief Complaint  Patient presents with   Shoulder Pain    Right for about a year would like injection If possible/ is currently recovering from lumbar fusion surgery in January     History of Present Illness: John Cook is a 53 y.o. male who has been referred by  France Ina, MD for evaluation of right shoulder pain.  He is right-hand dominant.  He states he has had pain in the right shoulder for about a year.  No specific injury.  He has not injured his right shoulder in the past.  He has pain over the posterior lateral aspect of the right shoulder.  His motion is pretty good otherwise.  No numbness or tingling.  He has been taking some Tylenol .  He states NSAIDs have been helpful.  No prior injections.  Has not worked with therapy.  He has had multiple back surgeries, with the most recent back surgery being  completed earlier this year.   Review of Systems: No fevers or chills No numbness or tingling No chest pain No shortness of breath No bowel or bladder dysfunction No GI distress No headaches   Medical History:  Past Medical History:  Diagnosis Date   Arthritis    Asthma    Diabetes mellitus without complication (HCC)    GERD (gastroesophageal reflux disease)    History of hiatal hernia    Hyperlipidemia    Hypertension    Mixed hyperlipidemia    Neuropathy    Prediabetes    Sleep apnea     Past Surgical History:  Procedure Laterality Date   APPLICATION OF WOUND VAC  02/13/2023   Procedure: APPLICATION OF WOUND VAC;  Surgeon: Augusto Blonder, MD;  Location: [facility] OR;  Service: Neurosurgery;;   CATARACT EXTRACTION Bilateral    with lens implant   INCISION AND DRAINAGE ABSCESS Left 09/24/2015   Procedure: INCISION AND DRAINAGE OF LEFT NECK ABSCESS;  Surgeon: Lillette Reid III, MD;  Location: [facility] OR;  Service: General;  Laterality: Left;   LEFT HEART CATH AND CORONARY ANGIOGRAPHY N/A 10/16/2017   Procedure: LEFT HEART CATH AND CORONARY ANGIOGRAPHY;  Surgeon: Sammy Crisp, MD;  Location: [facility] IN[facility]AS[facility];  Service: Cardiovascular;  Laterality: N/A;   POSTERIOR FUSION PEDICLE SCREW PLACEMENT N/A 02/13/2023   Procedure: REVISION OF LUMBAR FOUR-FIVE HARDWARE;  Surgeon:  Augusto Blonder, MD;  Location: [facility] OR;  Service: Neurosurgery;  Laterality: N/A;   SPINAL FUSION  12/29/2022   Decompression and fusion L4-L5    Family History  Problem Relation Age of Onset   Hypertension Mother    COPD Mother    Heart disease Mother    Diabetes Mother    Heart attack Father    Social History   Tobacco Use   Smoking status: Former    Current packs/day: 0.00    Types: Cigarettes    Quit date: 09/23/1995    Years since quitting: 27.6   Smokeless tobacco: Never  Vaping Use   Vaping status: Never Used  Substance Use Topics   Alcohol use: Not Currently   Drug use: No     Allergies  Allergen Reactions   Lisinopril Cough    Current Meds  Medication Sig   acetaminophen  (TYLENOL ) 500 MG tablet Take 500 mg by mouth every 6 (six) hours as needed for mild pain or moderate pain.   atenolol  (TENORMIN ) 50 MG tablet Take 50 mg by mouth daily.   celecoxib (CELEBREX) 200 MG capsule Take 200 mg by mouth 2 (two) times daily.   DULoxetine  (CYMBALTA ) 30 MG capsule Take 30 mg by mouth 2 (two) times daily.   fenofibrate  (TRICOR ) 145 MG tablet Take 145 mg by mouth daily.   gabapentin  (NEURONTIN ) 300 MG capsule Take 600 mg by mouth 2 (two) times daily.   metFORMIN  (GLUCOPHAGE ) 500 MG tablet Take 500 mg by mouth daily.   omeprazole (PRILOSEC) 20 MG capsule Take 20 mg by mouth in the morning and at bedtime.   oxybutynin  (DITROPAN ) 5 MG tablet Take 5 mg by mouth daily.   oxyCODONE -acetaminophen  (PERCOCET) 10-325 MG tablet 1 tablet as needed Orally every 6 hrs   promethazine  (PHENERGAN ) 25 MG tablet Take 25 mg by mouth every 6 (six) hours as needed.   PROVENTIL  HFA 108 (90 Base) MCG/ACT inhaler Inhale 2 puffs into the lungs every 4 (four) hours as needed for shortness of breath or wheezing.   rosuvastatin  (CRESTOR ) 20 MG tablet Take 1 tablet (20 mg total) by mouth at bedtime. Hold while on Daptomycin  - can resume on 03/31/23   triamterene -hydrochlorothiazide  (MAXZIDE -25) 37.5-25 MG tablet Take 1 tablet by mouth daily.    Objective: BP (!) 149/87   Pulse 73   Ht 5\' 5"  (1.651 m)   Wt (!) 323 lb (146.5 kg)   BMI 53.75 kg/m   Physical Exam:  General: Alert and oriented. and No acute distress. Gait: Normal gait.  Right shoulder without deformity.  No swelling.  No bruising.  He has good range of motion.  Excellent strength.  Limited internal rotation, due to recent back surgery.  160 degrees forward flexion.  Passive external rotation of 45 degrees.  Tenderness palpation of the posterior shoulder.  Fingers are warm and well-perfused  IMAGING: I personally ordered and  reviewed the following images  X-rays of the right shoulder were obtained in clinic today.  No acute injuries noted.  No evidence of proximal humeral migration.  Mild loss of joint space.  There are small osteophytes of the inferior aspect of the humeral head, as well as the glenoid.  No bony lesions.  Impression: Right shoulder x-ray with mild loss of joint space, small inferior osteophytes   New Medications:  No orders of the defined types were placed in this encounter.     Tonita Frater, MD  05/16/2023 3:04 PM

## 2023-05-26 ENCOUNTER — Ambulatory Visit: Payer: Self-pay

## 2023-06-09 ENCOUNTER — Ambulatory Visit: Admitting: Orthopedic Surgery

## 2023-06-09 DIAGNOSIS — M79605 Pain in left leg: Secondary | ICD-10-CM | POA: Diagnosis not present

## 2023-06-09 DIAGNOSIS — M545 Low back pain, unspecified: Secondary | ICD-10-CM

## 2023-06-09 NOTE — Patient Instructions (Addendum)
 Recommend you follow up with Dr. Nat Badger

## 2023-06-11 ENCOUNTER — Encounter: Payer: Self-pay | Admitting: Orthopedic Surgery

## 2023-06-11 NOTE — Progress Notes (Signed)
 Orthopaedic Clinic Return  Assessment: John Cook is a 53 y.o. male with the following: Low back pain, with radiating pain into the left leg   Plan: Mr. Brodzinski has pain in his lower back, with radiating pains in the left leg.  He has recently undergone lumbar spine fusion, first in December 2024, and requiring another procedure couple months later.  He has not contacted his neurosurgeon.  I think that this is the most logical next step.  He did request pain medication, but states that tramadol  has not been effective for him.  Have encouraged him to contact his surgeons office, and he should be able to get an appointment fairly quickly.  If he has any further issues, he will contact the clinic.  Follow-up: Return if symptoms worsen or fail to improve.   Subjective:  Chief Complaint  Patient presents with   Back Pain    LBP w/ radiation down L leg     History of Present Illness: John Cook is a 54 y.o. male who returns to clinic for evaluation of low back pain.  He has had recent onset of pain in his lower back.  It is radiating into his left leg.  He has recently had 2 back surgeries by Dr. Nat Badger.  The first surgery was in December.  He had a follow-up procedure at the end of January.  He has been evaluated in the emergency department in [place] multiple times following surgery.  He was evaluated once again a couple of weeks ago.  He has elected to follow-up with me.  He has not contacted his Careers adviser.  He has pain is getting worse.  He denies fevers or chills.  Review of Systems: No fevers or chills No numbness or tingling No chest pain No shortness of breath No bowel or bladder dysfunction No GI distress No headaches   Objective: There were no vitals taken for this visit.  Physical Exam:  Alert and oriented.  No acute distress.  Low back incision is clean, dry and intact.  No surrounding erythema or drainage.  No fluctuance.  He has tenderness throughout the lower back.   Positive straight leg raise on the left.  IMAGING: I personally ordered and reviewed the following images:  I was unable to personally review the x-rays from the emergency department.  Tonita Frater, MD 06/11/2023 10:09 AM

## 2023-06-20 ENCOUNTER — Other Ambulatory Visit: Payer: Self-pay | Admitting: Neurosurgery

## 2023-06-20 DIAGNOSIS — M47816 Spondylosis without myelopathy or radiculopathy, lumbar region: Secondary | ICD-10-CM

## 2023-06-22 ENCOUNTER — Encounter: Payer: Self-pay | Admitting: Neurosurgery

## 2023-06-26 ENCOUNTER — Ambulatory Visit: Admitting: Internal Medicine

## 2023-06-27 ENCOUNTER — Ambulatory Visit
Admission: RE | Admit: 2023-06-27 | Discharge: 2023-06-27 | Disposition: A | Discharge status: Home / Self Care | Source: Ambulatory Visit | Attending: Neurosurgery | Admitting: Neurosurgery

## 2023-06-27 DIAGNOSIS — M47816 Spondylosis without myelopathy or radiculopathy, lumbar region: Secondary | ICD-10-CM

## 2023-06-28 ENCOUNTER — Other Ambulatory Visit: Payer: Self-pay

## 2023-06-28 ENCOUNTER — Ambulatory Visit (INDEPENDENT_AMBULATORY_CARE_PROVIDER_SITE_OTHER): Admitting: Internal Medicine

## 2023-06-28 ENCOUNTER — Encounter: Payer: Self-pay | Admitting: Internal Medicine

## 2023-06-28 VITALS — BP 116/70 | HR 68 | Resp 16 | Ht 65.0 in | Wt 325.0 lb

## 2023-06-28 DIAGNOSIS — T8149XD Infection following a procedure, other surgical site, subsequent encounter: Secondary | ICD-10-CM | POA: Diagnosis present

## 2023-06-28 DIAGNOSIS — M4626 Osteomyelitis of vertebra, lumbar region: Secondary | ICD-10-CM

## 2023-06-28 MED ORDER — DOXYCYCLINE HYCLATE 100 MG PO CAPS: 100.0000 mg | ORAL_CAPSULE | Freq: Two times a day (BID) | ORAL | 2 refills | Status: DC

## 2023-06-28 NOTE — Progress Notes (Signed)
 Patient ID: John Cook, male   DOB: 05/15/1970, 53 y.o.   MRN: [id number]  HPI  John Cook is a 53 y.o. male with T2Dm, obesity, who recently underwent fusion due to  lumbar spondylsis with radiculopathy. He states that it was healing well, no drainage to incision. He started to have intractable back pain after 2-3 wks post surgery. Repeat imaging showed that there was loosening of screws and cage migration. Dr lanis took patient back to the OR on 2/3 for revision. There was small amount of fluid in the subfascial space that swab culture was sent. Removal of old screws and new instrumentation was placed. Cx grew staph simulans for which he was started on vancomycin . Sed rate of 80. The patient's pain is improving. ID asked to weigh in for abtx management. Recent labs showing increase in creatinine while on 6 days of vancomycin  . He was switched to daptomycin  to end on march 20th.now on doxycycline  for which he is tolerating but still has significant back pain.   Dealing with sciatica for the last 6 wks ago. Had mri yesterday.   Continues to tolerate the doxycycline . Having difficulty getting out of bed. Taking time to do go to rollator and then mobilize.  Now mostly pain now at hip, and radiating down left leg but pain to both hips.  Getting tee'd up for nerve block  Lab Results  Component Value Date   ESRSEDRATE 17 04/19/2023     Outpatient Encounter Medications as of 06/28/2023  Medication Sig   acetaminophen  (TYLENOL ) 500 MG tablet Take 500 mg by mouth every 6 (six) hours as needed for mild pain or moderate pain.   atenolol  (TENORMIN ) 50 MG tablet Take 50 mg by mouth daily.   celecoxib (CELEBREX) 200 MG capsule Take 200 mg by mouth 2 (two) times daily.   doxycycline  (ADOXA) 100 MG tablet Take 100 mg by mouth 2 (two) times daily.   DULoxetine  (CYMBALTA ) 30 MG capsule Take 30 mg by mouth 2 (two) times daily.   fenofibrate  (TRICOR ) 145 MG tablet Take 145 mg by mouth daily.    gabapentin  (NEURONTIN ) 300 MG capsule Take 600 mg by mouth 2 (two) times daily.   losartan (COZAAR) 50 MG tablet Take 50 mg by mouth daily.   metFORMIN  (GLUCOPHAGE ) 500 MG tablet Take 500 mg by mouth daily.   omeprazole (PRILOSEC) 20 MG capsule Take 20 mg by mouth in the morning and at bedtime.   oxybutynin  (DITROPAN ) 5 MG tablet Take 5 mg by mouth daily.   oxyCODONE -acetaminophen  (PERCOCET) 10-325 MG tablet 1 tablet as needed Orally every 6 hrs   promethazine  (PHENERGAN ) 25 MG tablet Take 25 mg by mouth every 6 (six) hours as needed.   PROVENTIL  HFA 108 (90 Base) MCG/ACT inhaler Inhale 2 puffs into the lungs every 4 (four) hours as needed for shortness of breath or wheezing.   rosuvastatin  (CRESTOR ) 20 MG tablet Take 1 tablet (20 mg total) by mouth at bedtime. Hold while on Daptomycin  - can resume on 03/31/23   triamterene -hydrochlorothiazide  (MAXZIDE -25) 37.5-25 MG tablet Take 1 tablet by mouth daily.   No facility-administered encounter medications on file as of 06/28/2023.     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) 02/24/2023   Other spondylosis with radiculopathy, lumbar region 02/09/2023   Synovial cyst 12/29/2022   DOE (dyspnea on exertion) 11/17/2017   Excessive daytime sleepiness 11/17/2017   History of snoring 11/17/2017   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     Health Maintenance Due  Topic Date Due   COVID-19 Vaccine (1) Never done   FOOT EXAM  Never done   OPHTHALMOLOGY EXAM  Never done   Diabetic kidney evaluation - Urine ACR  Never done   Hepatitis C Screening  Never done   Pneumococcal Vaccine 37-64 Years old (1 of 2 - PCV) Never done   Zoster  Vaccines- Shingrix (1 of 2) Never done   Colonoscopy  Never done   HEMOGLOBIN A1C  06/26/2023     Review of Systems Still has significant back pain. 12 point ros is otherwise negative Physical Exam   BP 116/70   Pulse 68   Resp 16   Ht 5' 5 (1.651 m)   Wt (!) 325 lb (147.4 kg)   SpO2 99%   BMI 54.08 kg/m   Physical Exam  Constitutional: He is oriented to person, place, and time. He appears well-developed and well-nourished. No distress.  HENT:  Mouth/Throat: Oropharynx is clear and moist. No oropharyngeal exudate.  Cardiovascular: Normal rate, regular rhythm and normal heart sounds. Exam reveals no gallop and no friction rub.  No murmur heard.  Pulmonary/Chest: Effort normal and breath sounds normal. No respiratory distress. He has no wheezes.  Abdominal: Soft. Bowel sounds are normal. He exhibits no distension. There is no tenderness.  Lymphadenopathy:  He has no cervical adenopathy.  Neurological: He is alert and oriented to person, place, and time.  Skin: Skin is warm and dry. No rash noted. No erythema.  Psychiatric: He has a normal mood and affect. His behavior is normal.    CBC Lab Results  Component Value Date   WBC 7.7 04/19/2023   RBC 5.03 04/19/2023   HGB 13.9 04/19/2023   HCT 43.8 04/19/2023   PLT 272 04/19/2023   MCV 87.1 04/19/2023   MCH 27.6 04/19/2023   MCHC 31.7 (L) 04/19/2023   RDW 15.5 (H) 04/19/2023   LYMPHSABS 3.1 02/23/2023   MONOABS 1.1 (H) 02/23/2023   EOSABS 208 04/19/2023    BMET Lab Results  Component Value Date   NA 136 04/19/2023   K 4.0 04/19/2023   CL 99 04/19/2023   CO2 25 04/19/2023   GLUCOSE 90 04/19/2023   BUN 22 04/19/2023   CREATININE 1.02 04/19/2023   CALCIUM  10.0 04/19/2023   GFRNONAA >60 02/23/2023   GFRAA >60 10/12/2017    Lab Results  Component Value Date   ESRSEDRATE 17 04/19/2023   Lab Results  Component Value Date   CRP 4.6 04/19/2023   Addendum: MRI read from 6/17: IMPRESSION: 1. Previous posterior  decompression, diskectomy and fusion procedure at L4-5. Limited detail because of artifact from fusion hardware. I cannot accurately evaluate the spinal canal or neural foramina at this level. 2. L2-3 and L3-4: Disc bulges and facet hypertrophy. Mild multifactorial stenosis but without definite neural compression. 3. T12-L1: Shallow disc herniation to the right of midline with slight upward migration as seen previously. No compressive effect upon the spinal cord and no foraminal extension. 4. L5-S1: Mild disc bulge and facet degeneration. No compressive stenosis.  Assessment and Plan Lumbar hw fusion SSI infection = recommend to continue with doxycycline  100mg  po bid. Appears to tolerate. We will  see back in 6-8wk and discuss to continue with further suppression after reviewing repeat labs at that time.

## 2023-06-30 ENCOUNTER — Ambulatory Visit: Admitting: Podiatry

## 2023-06-30 DIAGNOSIS — L600 Ingrowing nail: Secondary | ICD-10-CM

## 2023-06-30 DIAGNOSIS — M79671 Pain in right foot: Secondary | ICD-10-CM

## 2023-06-30 NOTE — Patient Instructions (Signed)

## 2023-06-30 NOTE — Progress Notes (Signed)
 Patient complains of painful ingrown both borders border(s) toe second right. Patient denies fevers, chills, nausea, vomiting.  Hit the toe into something.  It has been painful since then he says border gave him problems in the past.  Type II diabetic.  Under good control his A1c says is generally between 5 and 6  Objective:  Vitals: Reviewed  General: Well developed, nourished, in no acute distress, alert and oriented x3   Vascular: DP pulse 2/4 bilateral. PT pulse 1/4 bilateral.  Severe edema lower legs bilaterally.  Capillary refill time immediate  Dermatology: Erythema, edema, incurvated nail border both second toe right with clear drainage . Tenderness present with palpation. Normal skin tone and texture feet with normal hair growth.  Neurological: Grossly intact. Normal reflexes.   Musculoskeletal: Tenderness with palpation of the distal second toe right. No tenderness or painful ROM at IPJ.  Tenderness at second metatarsal phalangeal joint with palpation plantarly  Diagnosis: 1.  Pain right foot. 2.  Ingrown nail both borders second toenail right  Plan: -discussed etiology and treatment of ingrown nails. Discussed surgical vs conservative treatment. -Consent signed for appropriate matrixectomy affected nail(s).   Procedure(s):   - Matrixectomy(s) both borders second toe right: Toe anesthetized with 3cc 2:1 mixture 2% Lidocaine  with epinephrine : Sodium Bicarbonate. Surgical site prepped. Digital tourniquet applied.  Avulsion of nail plate. performed. Matrixecomy performed with three 30 second applications of phenol to nail matrix at the borders. Site irrigated with alcohol.  Tourniquet released with good vascularity noticed in digit.  Applied triple antibiotic to nailbed and applied gauze and Coban dressing.  We avulsed the middle portion of the nail since it was already loose - Written and oral postoperative instructions given.  -Return for post-op 2 weeks.  Baker Bon,  DPM

## 2023-07-17 ENCOUNTER — Encounter: Payer: Self-pay | Admitting: Podiatry

## 2023-07-17 ENCOUNTER — Ambulatory Visit (INDEPENDENT_AMBULATORY_CARE_PROVIDER_SITE_OTHER): Admitting: Podiatry

## 2023-07-17 DIAGNOSIS — L600 Ingrowing nail: Secondary | ICD-10-CM

## 2023-07-17 NOTE — Progress Notes (Signed)
 Patient presents follow-up nail surgery left great toe.  No complaints.  Physical exam:  Dermatologic: Nail surgery site for matrixectomy healing well with no signs of infection.  Diagnosis: 1.  Status post matrixectomy toe.  Healing well  Plan: - POV status post nail surgery hallux right if patient has any problems she can call for appointment otherwise we can see her as needed - Return as needed

## 2023-08-02 NOTE — Progress Notes (Signed)
 John Cook, male    DOB: [birth year]    MRN: [id number]   Brief patient profile:  53 yowm  quit smoking 1997 s symptoms at wt  around 250   referred to pulmonary clinic in [place]  05/10/2023 by [facility]  for doe x 2015    OSA  moderate dx by Neda last seen 2019  @ wt 367 rec pfts but not done  - could not afford cpap but has done better noct with wt loss since then    History of Present Illness  05/10/2023  Pulmonary/ 1st office eval/ John Cook / [place] Office @  314 lb  Chief Complaint  Patient presents with   Establish Care   Shortness of Breath  Dyspnea:  walks at park 3 x weekly x 30 min total using rollator, stops to rest due to back pain  Cough: none  Sleep: bed blocks/ one pillow/ could not afford cpap machine  SABA use: prn  last time a  one month ago  02: none  Rec  Ok to try albuterol  15 min before an activity (on alternating days)  that you know would usually make you short of breath  Cxr ok      08/03/2023  f/u ov/[place] office/John Cook re: doe x 2015/ [place]  maint on no resp rx  Chief Complaint  Patient presents with   Medical Management of Chronic Issues   Shortness of Breath    PFT done today. Breathing is unchanged since the last visit.   Dyspnea:  limited by back and both legs/ walking with   rollator again Cough: none  Sleeping: bed blocks/ on pillow s resp cc  SABA use: rarely if ever  02: none      No obvious day to day or daytime variability or assoc excess/ purulent sputum or mucus plugs or hemoptysis or cp or chest tightness, subjective wheeze or overt   hb symptoms.    Also denies any obvious fluctuation of symptoms with weather or environmental changes or other aggravating or alleviating factors except as outlined above   No unusual exposure hx or h/o childhood pna/ asthma or knowledge of premature birth.  Current Allergies, Complete Past Medical History, Past Surgical History, Family History, and Social History were reviewed in  [employer] record.  ROS  The following are not active complaints unless bolded Hoarseness, sore throat, dysphagia, dental problems, itching, sneezing,  nasal congestion or discharge of excess mucus or purulent secretions, ear ache,   fever, chills, sweats, unintended wt loss or wt gain, classically pleuritic or exertional cp,  orthopnea pnd or arm/hand swelling  or leg swelling, presyncope, palpitations, abdominal pain, anorexia, nausea, vomiting, diarrhea  or change in bowel habits or change in bladder habits, change in stools or change in urine, dysuria, hematuria,  rash, arthralgias, visual complaints, headache, numbness, weakness or ataxia or problems with walking or coordination,  change in mood or  memory.        Current Meds  Medication Sig   acetaminophen  (TYLENOL ) 500 MG tablet Take 500 mg by mouth every 6 (six) hours as needed for mild pain or moderate pain.   atenolol  (TENORMIN ) 50 MG tablet Take 50 mg by mouth daily.   celecoxib (CELEBREX) 200 MG capsule Take 200 mg by mouth 2 (two) times daily.   doxycycline  (VIBRAMYCIN ) 100 MG capsule Take 1 capsule (100 mg total) by mouth 2 (two) times daily.   DULoxetine  (CYMBALTA ) 30 MG capsule Take 30 mg  by mouth 2 (two) times daily.   fenofibrate  (TRICOR ) 145 MG tablet Take 145 mg by mouth daily.   gabapentin  (NEURONTIN ) 300 MG capsule Take 600 mg by mouth 2 (two) times daily.   HYDROcodone -acetaminophen  (NORCO) 7.5-325 MG tablet Take 1 tablet by mouth every 6 (six) hours as needed for moderate pain (pain score 4-6).   losartan (COZAAR) 50 MG tablet Take 50 mg by mouth daily.   metFORMIN  (GLUCOPHAGE ) 500 MG tablet Take 500 mg by mouth daily.   omeprazole (PRILOSEC) 20 MG capsule Take 20 mg by mouth in the morning and at bedtime.   oxybutynin  (DITROPAN ) 5 MG tablet Take 5 mg by mouth daily.   promethazine  (PHENERGAN ) 25 MG tablet Take 25 mg by mouth every 6 (six) hours as needed.   PROVENTIL  HFA 108 (90 Base) MCG/ACT  inhaler Inhale 2 puffs into the lungs every 4 (four) hours as needed for shortness of breath or wheezing.   rosuvastatin  (CRESTOR ) 20 MG tablet Take 1 tablet (20 mg total) by mouth at bedtime. Hold while on Daptomycin  - can resume on 03/31/23   triamterene -hydrochlorothiazide  (MAXZIDE -25) 37.5-25 MG tablet Take 1 tablet by mouth daily.            Past Medical History:  Diagnosis Date   Arthritis    Asthma    Diabetes mellitus without complication (HCC)    GERD (gastroesophageal reflux disease)    History of hiatal hernia    Hyperlipidemia    Hypertension    Mixed hyperlipidemia    Neuropathy    Prediabetes    Sleep apnea       Objective:    Wt Readings from Last 3 Encounters:  08/03/23 (!) 320 lb (145.2 kg)  06/28/23 (!) 325 lb (147.4 kg)  05/16/23 (!) 323 lb (146.5 kg)     Vital signs reviewed  08/03/2023  - Note at rest 02 sats  93% on RA   General appearance:    MO (by bmi ) am using rollator      HEENT : Oropharynx  clear     Nasal turbinates mild non-specific turbinate edema   NECK :  without  apparent JVD/ palpable Nodes/TM    LUNGS: no acc muscle use,  Nl contour chest which is clear to A and P bilaterally without cough on insp or exp maneuvers   CV:  RRR  no s3 or murmur or increase in P2, and no edema   ABD:  obese soft and nontender   MS:   ext warm without deformities Or obvious joint restrictions  calf tenderness, cyanosis or clubbing    SKIN: warm and dry without lesions    NEURO:  alert, approp, nl sensorium with  no motor or cerebellar deficits apparent.          Assessment

## 2023-08-03 ENCOUNTER — Encounter: Payer: Self-pay | Admitting: Internal Medicine

## 2023-08-03 ENCOUNTER — Ambulatory Visit (INDEPENDENT_AMBULATORY_CARE_PROVIDER_SITE_OTHER): Admitting: Internal Medicine

## 2023-08-03 ENCOUNTER — Ambulatory Visit (HOSPITAL_COMMUNITY)
Admission: RE | Admit: 2023-08-03 | Discharge: 2023-08-03 | Disposition: A | Discharge status: Home / Self Care | Source: Ambulatory Visit | Attending: Internal Medicine | Admitting: Internal Medicine

## 2023-08-03 VITALS — BP 114/78 | HR 80 | Ht 65.0 in | Wt 320.0 lb

## 2023-08-03 DIAGNOSIS — Z6841 Body Mass Index (BMI) 40.0 and over, adult: Secondary | ICD-10-CM

## 2023-08-03 DIAGNOSIS — R0609 Other forms of dyspnea: Secondary | ICD-10-CM | POA: Insufficient documentation

## 2023-08-03 DIAGNOSIS — Z87891 Personal history of nicotine dependence: Secondary | ICD-10-CM | POA: Diagnosis not present

## 2023-08-03 LAB — PULMONARY FUNCTION TEST
DL/VA % pred: 115 %
DL/VA: 5.1 ml/min/mmHg/L
DLCO unc % pred: 98 %
DLCO unc: 24 ml/min/mmHg
FEF 25-75 Post: 2.86 L/s
FEF 25-75 Pre: 2.77 L/s
FEF2575-%Change-Post: 3 %
FEF2575-%Pred-Post: 101 %
FEF2575-%Pred-Pre: 98 %
FEV1-%Change-Post: 1 %
FEV1-%Pred-Post: 82 %
FEV1-%Pred-Pre: 80 %
FEV1-Post: 2.62 L
FEV1-Pre: 2.58 L
FEV1FVC-%Change-Post: 2 %
FEV1FVC-%Pred-Pre: 106 %
FEV6-%Change-Post: -2 %
FEV6-%Pred-Post: 76 %
FEV6-%Pred-Pre: 79 %
FEV6-Post: 3.04 L
FEV6-Pre: 3.13 L
FEV6FVC-%Pred-Post: 104 %
FEV6FVC-%Pred-Pre: 104 %
FVC-%Change-Post: 0 %
FVC-%Pred-Post: 75 %
FVC-%Pred-Pre: 76 %
FVC-Post: 3.14 L
FVC-Pre: 3.16 L
Post FEV1/FVC ratio: 84 %
Post FEV6/FVC ratio: 100 %
Pre FEV1/FVC ratio: 82 %
Pre FEV6/FVC Ratio: 100 %
RV % pred: 71 %
RV: 1.31 L
TLC % pred: 86 %
TLC: 5.19 L

## 2023-08-03 MED ORDER — ALBUTEROL SULFATE (2.5 MG/3ML) 0.083% IN NEBU
2.5000 mg | INHALATION_SOLUTION | Freq: Once | RESPIRATORY_TRACT | Status: AC
  Administered 2023-08-03: 2.5 mg via RESPIRATORY_TRACT

## 2023-08-03 NOTE — Patient Instructions (Addendum)
 Late ADD ( AVS was inadvertently pasted from the prior ov)    To get the most out of exercise, you need to be continuously aware that you are short of breath, but never out of breath, for at least 30 minutes daily. As you improve, it will actually be easier for you to do the same amount of exercise  in  30 minutes so always push to the level where you are short of breath.     Pulmonary follow up is as needed

## 2023-08-05 ENCOUNTER — Telehealth: Payer: Self-pay | Admitting: Internal Medicine

## 2023-08-05 NOTE — Assessment & Plan Note (Addendum)
 PFTs with ERV 7%   - Body mass index is 53.25 kg/m.       Contributing to doe and risk of GERD/dvt/ pe  >>>   reviewed the need and the process to achieve and maintain neg calorie balance > defer f/u primary care including intermittently monitoring thyroid status        Each maintenance medication was reviewed in detail including emphasizing most importantly the difference between maintenance and prns and under what circumstances the prns are to be triggered using an action plan format where appropriate.  Total time for H and P, chart review, counseling,   and generating customized AVS unique to this office visit / same day charting =  31 min for final summary f/u ov

## 2023-08-05 NOTE — Assessment & Plan Note (Addendum)
 Onset 2015 on a background of progessive obesity -  stopped smoking 1997 at wt around 250 lb  -  LHC 10/2017  LVEDP 20 with nl  EF c/w diastolic dysfunction  -  05/10/2023   Walked on RA  x  3  lap(s) =  approx 450  ft  @ slow/rollator pace, stopped due to end of study with lowest 02 sats 96% and mild sob at end   >>>  PFTs 08/03/23  wnl except for ERV 7% @ wt = 320 lbs c/w with effects of MO on lung volumes and only rx is wt loss/ reviewed in detail with pt  >>> pulmonary f/u is prn

## 2023-08-05 NOTE — Telephone Encounter (Signed)
 It appears we printed the last ov's AVS   Please mail him the corrected version with f/u prn

## 2023-08-08 NOTE — Telephone Encounter (Signed)
 ATC x1 left detailed message per dpr- mailing AVS

## 2023-08-09 ENCOUNTER — Encounter: Payer: Self-pay | Admitting: Internal Medicine

## 2023-08-30 ENCOUNTER — Telehealth: Payer: Self-pay | Admitting: Internal Medicine

## 2023-08-30 NOTE — Telephone Encounter (Unsigned)
 Copied from CRM #[id number]. Topic: Clinical - Request for Lab/Test Order >> Aug 30, 2023  9:17 AM John Cook wrote: Reason for CRM: Patient called and is requesting information regarding a sleep study. Patient stated his pain management doctor advised him if he did not get a sleep study they would not be able to see him anymore. Patient stated it wad discussed at his previous visit regarding a sleep study. Patient is requesting a sleep study.

## 2023-08-30 NOTE — Telephone Encounter (Signed)
 Reason for CRM: Patient called and is requesting information regarding a sleep study. Patient stated his pain management doctor advised him if he did not get a sleep study they would not be able to see him anymore. Patient stated it wad discussed at his previous visit regarding a sleep study. Patient is requesting a sleep study.   I do not see where you have spoken with this pt about a sleep study, would you like me to schedule an appt with our APPs?

## 2023-08-31 ENCOUNTER — Telehealth: Payer: Self-pay

## 2023-08-31 NOTE — Telephone Encounter (Signed)
 PT has been Scheduled. NFN

## 2023-08-31 NOTE — Telephone Encounter (Signed)
 Copied from CRM [id number]. Topic: General - Other >> Aug 31, 2023  3:53 PM Rilla B wrote: Reason for CRM: Patient returning call to [facility].  Please call patient.  Second attempt to information [place] office will contact him to make appt with our sleep drs. Nothing further needed

## 2023-08-31 NOTE — Telephone Encounter (Signed)
 Please make pt appt with sleep medicine.

## 2023-09-04 ENCOUNTER — Other Ambulatory Visit: Payer: Self-pay

## 2023-09-04 ENCOUNTER — Ambulatory Visit: Admitting: Internal Medicine

## 2023-09-04 ENCOUNTER — Encounter: Payer: Self-pay | Admitting: Internal Medicine

## 2023-09-04 VITALS — BP 149/86 | HR 59 | Temp 97.0°F | Wt 335.0 lb

## 2023-09-04 DIAGNOSIS — B9689 Other specified bacterial agents as the cause of diseases classified elsewhere: Secondary | ICD-10-CM | POA: Diagnosis not present

## 2023-09-04 DIAGNOSIS — M4626 Osteomyelitis of vertebra, lumbar region: Secondary | ICD-10-CM

## 2023-09-04 DIAGNOSIS — T8149XD Infection following a procedure, other surgical site, subsequent encounter: Secondary | ICD-10-CM

## 2023-09-04 NOTE — Progress Notes (Signed)
 RFV: follow up on SSI  Patient ID: John Cook, male   DOB: 1970/04/23, 53 y.o.   MRN: [id number]  HPI Petra is a 53yo M with T2Dm, obesity, with hx of fusion complicated by loosening of screws and cage migration s/p revision on 2//25 in operative note -There was small amount of fluid in the subfascial space that swab culture was sent. Removal of old screws and new instrumentation was placed. Cx grew staph simulans, treated with IV abtx then has been on chronic doxycycline .  He states that he is still limited by pain, unable to be back to his previous function.  Outpatient Encounter Medications as of 09/04/2023  Medication Sig   acetaminophen  (TYLENOL ) 500 MG tablet Take 500 mg by mouth every 6 (six) hours as needed for mild pain or moderate pain.   atenolol  (TENORMIN ) 50 MG tablet Take 50 mg by mouth daily.   celecoxib (CELEBREX) 200 MG capsule Take 200 mg by mouth 2 (two) times daily.   doxycycline  (VIBRAMYCIN ) 100 MG capsule Take 1 capsule (100 mg total) by mouth 2 (two) times daily.   DULoxetine  (CYMBALTA ) 30 MG capsule Take 30 mg by mouth 2 (two) times daily.   fenofibrate  (TRICOR ) 145 MG tablet Take 145 mg by mouth daily.   gabapentin  (NEURONTIN ) 300 MG capsule Take 600 mg by mouth 2 (two) times daily.   HYDROcodone -acetaminophen  (NORCO) 7.5-325 MG tablet Take 1 tablet by mouth every 6 (six) hours as needed for moderate pain (pain score 4-6).   losartan (COZAAR) 50 MG tablet Take 50 mg by mouth daily.   metFORMIN  (GLUCOPHAGE ) 500 MG tablet Take 500 mg by mouth daily.   omeprazole (PRILOSEC) 20 MG capsule Take 20 mg by mouth in the morning and at bedtime.   oxybutynin  (DITROPAN ) 5 MG tablet Take 5 mg by mouth daily.   PROVENTIL  HFA 108 (90 Base) MCG/ACT inhaler Inhale 2 puffs into the lungs every 4 (four) hours as needed for shortness of breath or wheezing.   rosuvastatin  (CRESTOR ) 20 MG tablet Take 1 tablet (20 mg total) by mouth at bedtime. Hold while on Daptomycin  - can resume on 03/31/23    triamterene -hydrochlorothiazide  (MAXZIDE -25) 37.5-25 MG tablet Take 1 tablet by mouth daily.   promethazine  (PHENERGAN ) 25 MG tablet Take 25 mg by mouth every 6 (six) hours as needed. (Patient not taking: Reported on 09/04/2023)   No facility-administered encounter medications on file as of 09/04/2023.     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 (HCC) 02/24/2023   Other spondylosis with radiculopathy, lumbar region 02/09/2023   Synovial cyst 12/29/2022   DOE (dyspnea on exertion) 11/17/2017   Excessive daytime sleepiness 11/17/2017   History of snoring 11/17/2017   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     Health Maintenance Due  Topic Date Due   COVID-19 Vaccine (1) Never done   FOOT EXAM  Never done   OPHTHALMOLOGY EXAM  Never done   Diabetic kidney evaluation - Urine ACR  Never done   Hepatitis C Screening  Never done   Pneumococcal Vaccine: 50+ Years (1 of 2 - PCV) Never done   Hepatitis B Vaccines 19-59 Average Risk (1 of 3 - 19+ 3-dose series) Never done  Zoster Vaccines- Shingrix (1 of 2) Never done   Colonoscopy  Never done   HEMOGLOBIN A1C  06/26/2023   INFLUENZA VACCINE  08/11/2023     Review of Systems Review of Systems  Constitutional: Negative for fever, chills, diaphoresis, activity change, appetite change, fatigue and unexpected weight change.  HENT: Negative for congestion, sore throat, rhinorrhea, sneezing, trouble swallowing and sinus pressure.  Eyes: Negative for photophobia and visual disturbance.  Respiratory: Negative for cough, chest tightness, shortness of breath, wheezing and stridor.  Cardiovascular: Negative for chest pain,  palpitations and leg swelling.  Gastrointestinal: Negative for nausea, vomiting, abdominal pain, diarrhea, constipation, blood in stool, abdominal distention and anal bleeding.  Genitourinary: Negative for dysuria, hematuria, flank pain and difficulty urinating.  Musculoskeletal: Negative for myalgias, back pain, joint swelling, arthralgias and gait problem.  Skin: Negative for color change, pallor, rash and wound.  Neurological: Negative for dizziness, tremors, weakness and light-headedness.  Hematological: Negative for adenopathy. Does not bruise/bleed easily.  Psychiatric/Behavioral: Negative for behavioral problems, confusion, sleep disturbance, dysphoric mood, decreased concentration and agitation.   Physical Exam   BP (!) 149/86   Pulse (!) 59   Temp (!) 97 F (36.1 C) (Temporal)   Wt (!) 335 lb (152 kg)   SpO2 92%   BMI 55.75 kg/m    Physical Exam  Constitutional: He is oriented to person, place, and time. He appears well-developed and well-nourished. No distress.  HENT:  Mouth/Throat: Oropharynx is clear and moist. No oropharyngeal exudate.  Cardiovascular: Normal rate, regular rhythm and normal heart sounds. Exam reveals no gallop and no friction rub.  No murmur heard.  Pulmonary/Chest: Effort normal and breath sounds normal. No respiratory distress. He has no wheezes.  Abdominal: Soft. Bowel sounds are normal. He exhibits no distension. There is no tenderness.  Lymphadenopathy:  He has no cervical adenopathy.  Neurological: He is alert and oriented to person, place, and time.  Skin: Skin is warm and dry. No rash noted. No erythema.  Psychiatric: He has a normal mood and affect. His behavior is normal.   CBC Lab Results  Component Value Date   WBC 7.7 04/19/2023   RBC 5.03 04/19/2023   HGB 13.9 04/19/2023   HCT 43.8 04/19/2023   PLT 272 04/19/2023   MCV 87.1 04/19/2023   MCH 27.6 04/19/2023   MCHC 31.7 (L) 04/19/2023   RDW 15.5 (H) 04/19/2023   LYMPHSABS 3.1  02/23/2023   MONOABS 1.1 (H) 02/23/2023   EOSABS 208 04/19/2023    BMET Lab Results  Component Value Date   NA 136 04/19/2023   K 4.0 04/19/2023   CL 99 04/19/2023   CO2 25 04/19/2023   GLUCOSE 90 04/19/2023   BUN 22 04/19/2023   CREATININE 1.02 04/19/2023   CALCIUM  10.0 04/19/2023   GFRNONAA >60 02/23/2023   GFRAA >60 10/12/2017   Lab Results  Component Value Date   ESRSEDRATE 6 09/04/2023   Lab Results  Component Value Date   CRP <3.0 09/04/2023      Assessment and Plan History of fusion ssi = has been on chronic suppression for 6 months. We Will check sed rate and crp; plan to finish doxycycline  on hand. Call if need to restart  Financial constraints = trying to find pharmacy that won't charge hm copay for his other medications since only has limited funds/fixed income  Rtc if needed

## 2023-09-05 LAB — C-REACTIVE PROTEIN: CRP: <3 mg/L (ref <8.0)

## 2023-09-05 LAB — SEDIMENTATION RATE: Sed Rate: 6 mm/h (ref 0–20)

## 2023-09-22 ENCOUNTER — Telehealth: Payer: Self-pay

## 2023-09-22 NOTE — Telephone Encounter (Signed)
 Patient called regarding labs from two weeks ago, discussed that inflammatory markers were WNL.   Message sent to Dr. Luiz to advise if any further action is needed.   John Cook, BSN, RN

## 2023-09-28 ENCOUNTER — Ambulatory Visit

## 2023-10-11 ENCOUNTER — Other Ambulatory Visit (HOSPITAL_COMMUNITY): Payer: Self-pay

## 2023-10-25 ENCOUNTER — Ambulatory Visit

## 2023-11-23 ENCOUNTER — Ambulatory Visit

## 2023-11-23 VITALS — BP 133/83 | HR 90 | Ht 65.0 in | Wt 339.4 lb

## 2023-11-23 DIAGNOSIS — E662 Morbid (severe) obesity with alveolar hypoventilation: Secondary | ICD-10-CM

## 2023-11-23 DIAGNOSIS — E669 Obesity, unspecified: Secondary | ICD-10-CM | POA: Diagnosis not present

## 2023-11-23 DIAGNOSIS — R0609 Other forms of dyspnea: Secondary | ICD-10-CM

## 2023-11-23 DIAGNOSIS — G4733 Obstructive sleep apnea (adult) (pediatric): Secondary | ICD-10-CM

## 2023-11-23 DIAGNOSIS — G4734 Idiopathic sleep related nonobstructive alveolar hypoventilation: Secondary | ICD-10-CM | POA: Diagnosis not present

## 2023-11-23 NOTE — Patient Instructions (Signed)
  VISIT SUMMARY: Today, we discussed your ongoing issues with obstructive sleep apnea, dyspnea on exertion, and morbid obesity. We reviewed your history, including your previous sleep study and lung function tests, and made plans for further evaluation and management.  YOUR PLAN: -OBSTRUCTIVE SLEEP APNEA WITH NOCTURNAL HYPOXEMIA: Obstructive sleep apnea is a condition where your airway becomes blocked during sleep, causing breathing pauses and low oxygen  levels. We have ordered an in-lab sleep study to assess the current severity of your condition and to determine if you need oxygen  therapy at night.  -MORBID OBESITY: Morbid obesity is a condition where excess body weight negatively affects your health. It contributes to your sleep apnea and possibly to shallow breathing at night. You should continue taking Ozempic as prescribed to help with weight loss, which may improve your sleep apnea.  -DYSPNEA ON EXERTION: Dyspnea on exertion means you experience shortness of breath during physical activity. This is likely due to your obesity. You should continue using your inhaler as prescribed by your pulmonologist.  -SUSPECTED OBESITY HYPOVENTILATION SYNDROME: Obesity hypoventilation syndrome is a condition where poor breathing due to obesity leads to low oxygen  levels, especially at night. We will evaluate this further during your in-lab sleep study.  INSTRUCTIONS: Please schedule and complete the in-lab sleep study as soon as possible. Continue taking Ozempic for weight loss and using your inhaler as prescribed. Follow up with your primary care doctor after the sleep study to discuss the results and any further steps.            Contains text generated by [employer]bridge.

## 2023-11-23 NOTE — Progress Notes (Signed)
 Pulmonology Office Visit   Subjective:  Patient ID: John Cook, male    DOB: 1970/08/10  MRN: [id number]  Referred by: Teresa Jenkins Jansky, FNP  CC:  Chief Complaint  Patient presents with   Consult    Tested for OSA 10 years ago, could not afford machine at the time.    HPI John Cook is a 53 y.o. male with hypertension, hyperlipidemia, diabetes, GERD, asthma presents for evaluation of OSA.  Seen by Dr. Neda in 2019. HST 2019: AHI 16, 364 minutes of hypoxemia during sleep, lowest O2 61. PFT July 2025: No obstruction, no restriction, ERV 7% predicted, normal DLCO.  FEV1 80% predicted, FVC 76% predicted.  No BD response  Discussed the use of AI scribe software for clinical note transcription with the patient, who gave verbal consent to proceed.  History of Present Illness   John Cook is a 53 year old male with obstructive sleep apnea and dyspnea on exertion who presents for evaluation.  He has a history of obstructive sleep apnea diagnosed over ten years ago. A sleep study in 2019 showed mild to moderate sleep apnea with significant hypoxemia for six hours. He experiences trouble sleeping, snoring, episodes of apnea, dry mouth in the morning, and sleep talking. He goes to bed between 11 PM and 12 AM, falls asleep within 20 to 30 minutes, and wakes up 2 to 3 times per night, sometimes due to back pain. He naps for 1 to 2 hours in the afternoon and feels tired throughout the day. No morning headaches or restless leg syndrome.  He experiences dyspnea on exertion, which has been ongoing for a long time. He occasionally coughs and produces whitish phlegm. He uses an albuterol  inhaler. He has been evaluated by a pulmonologist, who ruled out COPD. 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 He has not been diagnosed with asthma, but he does report wheezing.  He has a history of morbid obesity and his  expiratory reserve volume was noted to be 7% predicted in a lung function study from July 2025. He is currently prescribed Ozempic for weight loss, which is covered by his insurance.  He also has a history of GERD.      ESS 13 today.     11/17/2017   11:00 AM  Results of the Epworth flowsheet  Sitting and reading 0  Watching TV 3  Sitting, inactive in a public place (e.g. a theatre or a meeting) 0  As a passenger in a car for an hour without a break 2  Lying down to rest in the afternoon when circumstances permit 3  Sitting and talking to someone 0  Sitting quietly after a lunch without alcohol 3  In a car, while stopped for a few minutes in traffic 0  Total score 11    Allergies: Lisinopril  Current Outpatient Medications:    acetaminophen  (TYLENOL ) 500 MG tablet, Take 500 mg by mouth every 6 (six) hours as needed for mild pain or moderate pain., Disp: , Rfl:    atenolol  (TENORMIN ) 50 MG tablet, Take 50 mg by mouth daily., Disp: , Rfl:    fenofibrate  (TRICOR ) 145 MG tablet, Take 145 mg by mouth daily., Disp: , Rfl:    gabapentin  (NEURONTIN ) 300 MG capsule, Take 600 mg by mouth 2 (two) times daily., Disp: , Rfl:    HYDROcodone -acetaminophen  (NORCO) 7.5-325  MG tablet, Take 1 tablet by mouth every 6 (six) hours as needed for moderate pain (pain score 4-6)., Disp: , Rfl:    losartan (COZAAR) 50 MG tablet, Take 50 mg by mouth daily., Disp: , Rfl:    metFORMIN  (GLUCOPHAGE ) 500 MG tablet, Take 500 mg by mouth daily., Disp: , Rfl:    omeprazole (PRILOSEC) 20 MG capsule, Take 20 mg by mouth in the morning and at bedtime., Disp: , Rfl:    oxybutynin  (DITROPAN ) 5 MG tablet, Take 5 mg by mouth daily., Disp: , Rfl:    promethazine  (PHENERGAN ) 25 MG tablet, Take 25 mg by mouth every 6 (six) hours as needed., Disp: , Rfl:    PROVENTIL  HFA 108 (90 Base) MCG/ACT inhaler, Inhale 2 puffs into the lungs every 4 (four) hours as needed for shortness of breath or wheezing., Disp: , Rfl: 0   rosuvastatin   (CRESTOR ) 20 MG tablet, Take 1 tablet (20 mg total) by mouth at bedtime. Hold while on Daptomycin  - can resume on 03/31/23, Disp: , Rfl:    triamterene -hydrochlorothiazide  (MAXZIDE -25) 37.5-25 MG tablet, Take 1 tablet by mouth daily., Disp: , Rfl:  Past Medical History:  Diagnosis Date   Arthritis    Asthma    Diabetes mellitus without complication (HCC)    GERD (gastroesophageal reflux disease)    History of hiatal hernia    Hyperlipidemia    Hypertension    Mixed hyperlipidemia    Neuropathy    Prediabetes    Sleep apnea    Past Surgical History:  Procedure Laterality Date   APPLICATION OF WOUND VAC  02/13/2023   Procedure: APPLICATION OF WOUND VAC;  Surgeon: Lanis Pupa, MD;  Location: [facility] OR;  Service: Neurosurgery;;   CATARACT EXTRACTION Bilateral    with lens implant   INCISION AND DRAINAGE ABSCESS Left 09/24/2015   Procedure: INCISION AND DRAINAGE OF LEFT NECK ABSCESS;  Surgeon: Deward Null III, MD;  Location: [facility] OR;  Service: General;  Laterality: Left;   LEFT HEART CATH AND CORONARY ANGIOGRAPHY N/A 10/16/2017   Procedure: LEFT HEART CATH AND CORONARY ANGIOGRAPHY;  Surgeon: Mady Bruckner, MD;  Location: [facility] INVASIVE CV LAB;  Service: Cardiovascular;  Laterality: N/A;   POSTERIOR FUSION PEDICLE SCREW PLACEMENT N/A 02/13/2023   Procedure: REVISION OF LUMBAR FOUR-FIVE HARDWARE;  Surgeon: Lanis Pupa, MD;  Location: [facility] OR;  Service: Neurosurgery;  Laterality: N/A;   SPINAL FUSION  12/29/2022   Decompression and fusion L4-L5   Family History  Problem Relation Age of Onset   Hypertension Mother    COPD Mother    Heart disease Mother    Diabetes Mother    Heart attack Father    Social History   Socioeconomic History   Marital status: Single    Spouse name: Not on file   Number of children: Not on file   Years of education: Not on file   Highest education level: Not on file  Occupational History   Not on file  Tobacco Use   Smoking status: Former    Current  packs/day: 0.00    Types: Cigarettes    Quit date: 09/23/1995    Years since quitting: 28.1   Smokeless tobacco: Never  Vaping Use   Vaping status: Never Used  Substance and Sexual Activity   Alcohol use: Not Currently   Drug use: No   Sexual activity: Not on file  Other Topics Concern   Not on file  Social History Narrative   Not on file  Social Drivers of Corporate Investment Banker Strain: Not on file  Food Insecurity: Food Insecurity Present (02/09/2023)   Hunger Vital Sign    Worried About Running Out of Food in the Last Year: Sometimes true    Ran Out of Food in the Last Year: Sometimes true  Transportation Needs: Unmet Transportation Needs (02/09/2023)   PRAPARE - Administrator, Civil Service (Medical): Yes    Lack of Transportation (Non-Medical): Yes  Physical Activity: Not on file  Stress: Not on file  Social Connections: Not on file  Intimate Partner Violence: Not At Risk (02/09/2023)   Humiliation, Afraid, Rape, and Kick questionnaire    Fear of Current or Ex-Partner: No    Emotionally Abused: No    Physically Abused: No    Sexually Abused: No       Objective:  BP 133/83 (BP Location: Right Arm, Patient Position: Sitting, Cuff Size: Large)   Pulse 90   Ht 5' 5 (1.651 m)   Wt (!) 339 lb 6.4 oz (154 kg)   SpO2 93% Comment: RA  BMI 56.48 kg/m  BMI Readings from Last 3 Encounters:  11/23/23 56.48 kg/m  09/04/23 55.75 kg/m  08/03/23 53.25 kg/m    Physical Exam: Physical Exam   ENT: Normal mucosa. No hypertrophy of inferior turbinates. Tonsils are normal sized. Modified Mallampati score is 3. Pharynx normal. PULMONARY: Lungs clear to auscultation bilaterally, no adventitious breath sounds. CARDIOVASCULAR: Regular rate and rhythm, S1 S2 normal, no murmurs. ABDOMEN: Abdomen soft, nontender. Bowel sounds are normal. EXTREMITIES: No peripheral edema noted.       Diagnostic Review:  Last metabolic panel Lab Results  Component Value Date    GLUCOSE 90 04/19/2023   NA 136 04/19/2023   K 4.0 04/19/2023   CL 99 04/19/2023   CO2 25 04/19/2023   BUN 22 04/19/2023   CREATININE 1.02 04/19/2023   EGFR 88 04/19/2023   CALCIUM  10.0 04/19/2023   PROT 8.4 (H) 02/09/2023   ALBUMIN  3.2 (L) 02/09/2023   BILITOT 0.6 02/09/2023   ALKPHOS 84 02/09/2023   AST 24 02/09/2023   ALT 17 02/09/2023   ANIONGAP 12 02/23/2023         Assessment & Plan:  Assessment and Plan    Obstructive sleep apnea with nocturnal hypoxemia OHS:  Diagnosed with obstructive sleep apnea ten years ago. 2019 sleep study showed mild to moderate apnea with significant nocturnal hypoxemia. No CPAP use. Differential includes obesity hypoventilation syndrome due to morbid obesity. I discussed with the patient the pathophysiology of obstructive sleep apnea, its association with weight, and its negative effects on hypertension, diabetes, mental health, A-fib, stroke if left untreated.  I briefly discussed the treatment options for obstructive sleep apnea  - Ordered in-lab sleep study to assess current severity and nocturnal oxygenation (split night). - Evaluate need for nocturnal oxygen  therapy based on study results.  Morbid obesity Contributing to obstructive sleep apnea and suspected obesity hypoventilation syndrome. Financial constraints limit weight loss program access. Prescribed Ozempic for weight loss, potentially improving sleep apnea. - Continue Ozempic for weight loss as prescribed by primary care.  Dyspnea on exertion Chronic dyspnea with occasional cough and whitish sputum. No asthma or COPD. Lung function tests unremarkable, low ERV likely due to obesity. History of smoking and occupational exposure. - Continue current inhaler use as prescribed by Dr Darlean.  - will need nocturnal oxygen  based on above.   Suspected obesity hypoventilation syndrome Due to morbid obesity and nocturnal hypoxemia.  Shallow breathing at night may contribute to low  oxygenation. - Evaluate for obesity hypoventilation syndrome during in-lab sleep study.          He/She was counselled about not driving while drowsy which is common side effect of sleep related disorders.   Return for 2 month After Sleep study.   I personally spent a total of 30 minutes in the care of the patient today including preparing to see the patient, getting/reviewing separately obtained history, performing a medically appropriate exam/evaluation, counseling and educating, placing orders, documenting clinical information in the EHR, independently interpreting results, and communicating results.   Guy Toney, MD

## 2024-02-04 ENCOUNTER — Ambulatory Visit (HOSPITAL_BASED_OUTPATIENT_CLINIC_OR_DEPARTMENT_OTHER)

## 2024-02-15 ENCOUNTER — Ambulatory Visit (HOSPITAL_BASED_OUTPATIENT_CLINIC_OR_DEPARTMENT_OTHER)

## 2024-02-20 ENCOUNTER — Ambulatory Visit
